# Patient Record
Sex: Male | Born: 1960 | Race: White | Hispanic: No | Smoking: Current some day smoker
Health system: Southern US, Community
[De-identification: ages and names within clinical notes are randomized; demographics above are authoritative.]

## PROBLEM LIST (undated history)

## (undated) DIAGNOSIS — I340 Nonrheumatic mitral (valve) insufficiency: Secondary | ICD-10-CM

## (undated) DIAGNOSIS — I219 Acute myocardial infarction, unspecified: Secondary | ICD-10-CM

## (undated) DIAGNOSIS — G4730 Sleep apnea, unspecified: Secondary | ICD-10-CM

## (undated) DIAGNOSIS — M4800 Spinal stenosis, site unspecified: Secondary | ICD-10-CM

## (undated) DIAGNOSIS — I10 Essential (primary) hypertension: Secondary | ICD-10-CM

## (undated) DIAGNOSIS — I499 Cardiac arrhythmia, unspecified: Secondary | ICD-10-CM

## (undated) DIAGNOSIS — Z8679 Personal history of other diseases of the circulatory system: Secondary | ICD-10-CM

## (undated) DIAGNOSIS — E7800 Pure hypercholesterolemia, unspecified: Secondary | ICD-10-CM

## (undated) DIAGNOSIS — T8859XA Other complications of anesthesia, initial encounter: Secondary | ICD-10-CM

## (undated) DIAGNOSIS — Z9889 Other specified postprocedural states: Secondary | ICD-10-CM

## (undated) DIAGNOSIS — Z951 Presence of aortocoronary bypass graft: Secondary | ICD-10-CM

## (undated) DIAGNOSIS — R011 Cardiac murmur, unspecified: Secondary | ICD-10-CM

## (undated) DIAGNOSIS — R519 Headache, unspecified: Secondary | ICD-10-CM

## (undated) DIAGNOSIS — R51 Headache: Secondary | ICD-10-CM

## (undated) DIAGNOSIS — T4145XA Adverse effect of unspecified anesthetic, initial encounter: Secondary | ICD-10-CM

## (undated) DIAGNOSIS — I4819 Other persistent atrial fibrillation: Secondary | ICD-10-CM

## (undated) DIAGNOSIS — J189 Pneumonia, unspecified organism: Secondary | ICD-10-CM

## (undated) DIAGNOSIS — I2510 Atherosclerotic heart disease of native coronary artery without angina pectoris: Secondary | ICD-10-CM

## (undated) HISTORY — PX: HAND NERVE GRAFT: SHX1727

## (undated) HISTORY — PX: FEMORAL ARTERY STENT: SHX1583

## (undated) HISTORY — PX: MOUTH SURGERY: SHX715

## (undated) HISTORY — DX: Other persistent atrial fibrillation: I48.19

## (undated) HISTORY — PX: ROTATOR CUFF REPAIR: SHX139

## (undated) HISTORY — DX: Spinal stenosis, site unspecified: M48.00

## (undated) HISTORY — DX: Nonrheumatic mitral (valve) insufficiency: I34.0

## (undated) HISTORY — PX: CARDIAC CATHETERIZATION: SHX172

---

## 2002-12-01 ENCOUNTER — Encounter: Admission: RE | Admit: 2002-12-01 | Discharge: 2002-12-01 | Payer: Self-pay | Admitting: Internal Medicine

## 2002-12-01 ENCOUNTER — Encounter: Payer: Self-pay | Admitting: Internal Medicine

## 2007-12-23 ENCOUNTER — Inpatient Hospital Stay (HOSPITAL_COMMUNITY): Admission: EM | Admit: 2007-12-23 | Discharge: 2007-12-27 | Payer: Self-pay | Admitting: Emergency Medicine

## 2007-12-23 ENCOUNTER — Ambulatory Visit: Payer: Self-pay | Admitting: Cardiovascular Disease

## 2008-01-11 ENCOUNTER — Ambulatory Visit: Payer: Self-pay | Admitting: Cardiovascular Disease

## 2008-11-30 DIAGNOSIS — I2510 Atherosclerotic heart disease of native coronary artery without angina pectoris: Secondary | ICD-10-CM | POA: Insufficient documentation

## 2008-11-30 DIAGNOSIS — E669 Obesity, unspecified: Secondary | ICD-10-CM | POA: Insufficient documentation

## 2008-11-30 DIAGNOSIS — F17200 Nicotine dependence, unspecified, uncomplicated: Secondary | ICD-10-CM | POA: Insufficient documentation

## 2008-11-30 DIAGNOSIS — I10 Essential (primary) hypertension: Secondary | ICD-10-CM | POA: Insufficient documentation

## 2008-11-30 DIAGNOSIS — E785 Hyperlipidemia, unspecified: Secondary | ICD-10-CM | POA: Insufficient documentation

## 2008-11-30 DIAGNOSIS — R7309 Other abnormal glucose: Secondary | ICD-10-CM | POA: Insufficient documentation

## 2008-12-10 ENCOUNTER — Ambulatory Visit: Payer: Self-pay | Admitting: Cardiovascular Disease

## 2009-07-10 ENCOUNTER — Encounter: Admission: RE | Admit: 2009-07-10 | Discharge: 2009-07-10 | Payer: Self-pay | Admitting: Internal Medicine

## 2009-08-20 ENCOUNTER — Encounter: Payer: Self-pay | Admitting: Cardiovascular Disease

## 2009-08-20 ENCOUNTER — Ambulatory Visit (HOSPITAL_BASED_OUTPATIENT_CLINIC_OR_DEPARTMENT_OTHER): Admission: RE | Admit: 2009-08-20 | Discharge: 2009-08-20 | Payer: Self-pay | Admitting: Cardiovascular Disease

## 2009-08-21 ENCOUNTER — Telehealth (INDEPENDENT_AMBULATORY_CARE_PROVIDER_SITE_OTHER): Payer: Self-pay | Admitting: Radiology

## 2009-08-21 ENCOUNTER — Telehealth (INDEPENDENT_AMBULATORY_CARE_PROVIDER_SITE_OTHER): Payer: Self-pay | Admitting: *Deleted

## 2009-08-22 ENCOUNTER — Ambulatory Visit: Payer: Self-pay

## 2009-08-22 ENCOUNTER — Ambulatory Visit: Payer: Self-pay | Admitting: Cardiovascular Disease

## 2009-08-22 ENCOUNTER — Ambulatory Visit: Payer: Self-pay | Admitting: Cardiology

## 2009-08-22 ENCOUNTER — Encounter (HOSPITAL_COMMUNITY): Admission: RE | Admit: 2009-08-22 | Discharge: 2009-10-15 | Payer: Self-pay | Admitting: Cardiovascular Disease

## 2009-08-25 LAB — CONVERTED CEMR LAB
AST: 19 units/L (ref 0–37)
CO2: 29 meq/L (ref 19–32)
Calcium: 8.8 mg/dL (ref 8.4–10.5)
Glucose, Bld: 108 mg/dL — ABNORMAL HIGH (ref 70–99)
HDL: 59.9 mg/dL (ref >39.00)
Sodium: 140 meq/L (ref 135–145)
Total Bilirubin: 0.3 mg/dL (ref 0.3–1.2)
Total CHOL/HDL Ratio: 3

## 2009-08-26 ENCOUNTER — Encounter: Payer: Self-pay | Admitting: Cardiology

## 2009-08-26 ENCOUNTER — Encounter (HOSPITAL_COMMUNITY): Admission: RE | Admit: 2009-08-26 | Discharge: 2009-10-15 | Payer: Self-pay | Admitting: Cardiovascular Disease

## 2009-08-26 ENCOUNTER — Ambulatory Visit: Payer: Self-pay

## 2009-08-27 ENCOUNTER — Ambulatory Visit: Payer: Self-pay | Admitting: Pulmonary Disease

## 2009-08-27 ENCOUNTER — Encounter: Payer: Self-pay | Admitting: Cardiovascular Disease

## 2009-09-12 ENCOUNTER — Telehealth: Payer: Self-pay | Admitting: Cardiovascular Disease

## 2009-09-17 ENCOUNTER — Ambulatory Visit: Payer: Self-pay | Admitting: Cardiovascular Disease

## 2009-09-20 LAB — CONVERTED CEMR LAB
CO2: 28 meq/L (ref 19–32)
Chloride: 97 meq/L (ref 96–112)
Glucose, Bld: 90 mg/dL (ref 70–99)
Potassium: 4.2 meq/L (ref 3.5–5.1)
Sodium: 133 meq/L — ABNORMAL LOW (ref 135–145)

## 2009-12-04 ENCOUNTER — Encounter: Admission: RE | Admit: 2009-12-04 | Discharge: 2009-12-04 | Payer: Self-pay | Admitting: Orthopedic Surgery

## 2010-02-25 ENCOUNTER — Encounter: Payer: Self-pay | Admitting: Cardiovascular Disease

## 2010-03-07 ENCOUNTER — Encounter: Payer: Self-pay | Admitting: Cardiovascular Disease

## 2010-03-13 ENCOUNTER — Ambulatory Visit: Payer: Self-pay | Admitting: Cardiovascular Disease

## 2010-03-24 ENCOUNTER — Telehealth: Payer: Self-pay | Admitting: Cardiovascular Disease

## 2010-04-01 ENCOUNTER — Observation Stay (HOSPITAL_COMMUNITY): Admission: RE | Admit: 2010-04-01 | Discharge: 2010-04-02 | Payer: Self-pay | Admitting: Orthopedic Surgery

## 2010-04-18 ENCOUNTER — Telehealth (INDEPENDENT_AMBULATORY_CARE_PROVIDER_SITE_OTHER): Payer: Self-pay | Admitting: *Deleted

## 2010-07-15 NOTE — Progress Notes (Signed)
Summary: REFILL  Phone Note Refill Request Message from:  Patient on August 21, 2009 8:51 AM  Refills Requested: Medication #1:  PLAVIX 75 MG TABS Take one tablet by mouth daily  Medication #2:  METOPROLOL TARTRATE 50 MG TABS Take 1 1/2  tablet by mouth twice a day  Medication #3:  LOVASTATIN 40 MG TABS Take two  tablets by mouth daily at bedtime [facility]  [phone] [facility]  Initial call taken by: Judie Grieve,  August 21, 2009 8:53 AM  Follow-up for Phone Call        Rx faxed to pharmacy Follow-up by: Vikki Ports,  August 21, 2009 11:19 AM    Prescriptions: PLAVIX 75 MG TABS (CLOPIDOGREL BISULFATE) Take one tablet by mouth daily  #30 x 6   Entered by:   Vikki Ports   Authorized by:   Norva Karvonen, MD   Signed by:   Vikki Ports on 08/21/2009   Method used:   Faxed to ...       [facility]* (retail)       [address]       [place], [place]  [address]       Ph: [phone]       Fax: [phone]   RxID:   [id number] METOPROLOL TARTRATE 50 MG TABS (METOPROLOL TARTRATE) Take 1 1/2  tablet by mouth twice a day  #90 x 6   Entered by:   Vikki Ports   Authorized by:   Norva Karvonen, MD   Signed by:   Vikki Ports on 08/21/2009   Method used:   Faxed to ...       [facility]* (retail)       [address]       [place], [place]  [address]       Ph: [phone]       Fax: [phone]   RxID:   [id number] LOVASTATIN 40 MG TABS (LOVASTATIN) Take two  tablets by mouth daily at bedtime  #60 x 6   Entered by:   Vikki Ports   Authorized by:   Norva Karvonen, MD   Signed by:   Vikki Ports on 08/21/2009   Method used:   Faxed to ...       [facility]* (retail)       [address]       [place], [place]  [id number]       Ph: [phone]       Fax: [phone]   RxID:   [id number]

## 2010-07-15 NOTE — Progress Notes (Signed)
Summary: refill request  Phone Note Refill Request Message from:  Patient on April 18, 2010 8:48 AM  Refills Requested: Medication #1:  PLAVIX 75 MG TABS Take one tablet by mouth daily [facility]   Method Requested: Telephone to Pharmacy Initial call taken by: Glynda Jaeger,  April 18, 2010 8:49 AM  Follow-up for Phone Call        Rx faxed to pharmacy. Vikki Ports  April 18, 2010 11:34 AM     Prescriptions: PLAVIX 75 MG TABS (CLOPIDOGREL BISULFATE) Take one tablet by mouth daily  #30 x 6   Entered by:   Vikki Ports   Authorized by:   Norva Karvonen, MD   Signed by:   Vikki Ports on 04/18/2010   Method used:   Faxed to ...       [facility]* (retail)       [address]       [place], [place]  [id number]       Ph: [phone]       Fax: [phone]   RxID:   [id number]

## 2010-07-15 NOTE — Progress Notes (Signed)
Summary: Sleep Study Results  Phone Note Outgoing Call Call back at [facility] Phone [phone]   Call placed by: Julieta Gutting, RN, BSN,  September 12, 2009 3:07 PM Call placed to: Patient Summary of Call: Left message for pt to call back about sleep study results. Julieta Gutting, RN, BSN  September 12, 2009 3:07 PM  Follow-up for Phone Call        returning call, Migdalia Dk  September 13, 2009 8:08 AM   Left message for pt to call back. Julieta Gutting, RN, BSN  September 13, 2009 9:08 AM  I spoke with the pt about the results of his sleep study.  The pt does have severe obstructive sleep apnea.  I told the pt that he needs to see a Pulmonary MD for sleep consult.  The pt refuses an appt at this time and he does not want to use CPAP.  I will forward this information to Dr Excell Seltzer so that he is aware of the pt's refusal. Follow-up by: Julieta Gutting, RN, BSN,  September 13, 2009 10:08 AM

## 2010-07-15 NOTE — Assessment & Plan Note (Signed)
Summary: aden stress only wt 335/dr cooper/sl  Nuclear Med Background Indications for Stress Test: Evaluation for Ischemia, Stent Patency   History: Heart Catheterization, Myocardial Infarction, Stents  History Comments: '09 NSTEMI >Stent-RCA, LAD  Symptoms: DOE, Fatigue    Nuclear Pre-Procedure Cardiac Risk Factors: Family History - CAD, Hypertension, Lipids, Obesity, Smoker Caffeine/Decaff Intake: None NPO After: 8:30 AM Lungs: Clear.  O2 Sat 94% on RA. IV 0.9% NS with Angio Cath: 20g     IV Site: (R) AC IV Started by: Stanton Kidney EMT-P Chest Size (in) 54     Height (in): 72 Weight (lb): 316 BMI: 43.01  Nuclear Med Study 1 or 2 day study:  2 day     Stress Test Type:  Eugenie Birks Reading MD:  Willa Rough, MD     Referring MD:  Tonny Bollman, MD Resting Radionuclide:  Technetium 56m Tetrofosmin     Resting Radionuclide Dose:  33.0 mCi  Stress Radionuclide:  Technetium 32m Tetrofosmin     Stress Radionuclide Dose:  33.0 mCi   Stress Protocol   Lexiscan: 0.4 mg   Stress Test Technologist:  Rea College CMA-N     Nuclear Technologist:  Domenic Polite CNMT  Rest Procedure  Myocardial perfusion imaging was performed at rest 45 minutes following the intravenous administration of Myoview Technetium 78m Tetrofosmin.  Stress Procedure  The patient received IV Lexiscan 0.4 mg over 15-seconds.  Myoview injected at 30-seconds.  There were no significant changes with lexiscan.  He did c/o chest pain with lexiscan.  Quantitative spect images were obtained after a 45 minute delay.  QPS Raw Data Images:  Patient motion noted; appropriate software correction applied. Stress Images:  Some decrease in activity in the inferior wall. Rest Images:  Same as stress. Subtraction (SDS):  No evidence of ischemia. Transient Ischemic Dilatation:  1.02  (Normal <1.22)  Lung/Heart Ratio:  .35  (Normal <0.45)  Quantitative Gated Spect Images QGS EDV:  230 ml QGS ESV:  115 ml QGS EF:  50  % QGS cine images:  Possible mild relative hypokinesis of the inferior wall   Overall Impression  Exercise Capacity: Lexiscan BP Response: Normal blood pressure response. Clinical Symptoms: chest cramping ECG Impression: No significant ST segment change suggestive of ischemia. Overall Impression Comments: Considering the patient's weight and the fact that  there was some motion, it is not surprising that the images are technically difficult. There is no marked abnormality. I can not rule out the possibility of some inferior scar/ischemia, but this is a difficult call.

## 2010-07-15 NOTE — Progress Notes (Signed)
Summary: Nuc Pre-Procedure  Phone Note Call from Patient   Caller: Patient Summary of Call: Reviewed information on Myoview Information Sheet (see scanned document for further details).  Spoke with patient. Initial call taken by: Leonia Corona, RT-N,  August 21, 2009 9:13 AM    Nuclear Med Background Indications for Stress Test: Evaluation for Ischemia, Stent Patency   History: Heart Catheterization, Myocardial Infarction, Stents  History Comments: '09 NSTEMI > RCA, LAD Stents  Symptoms: DOE, Fatigue    Nuclear Pre-Procedure Cardiac Risk Factors: Family History - CAD, Hypertension, Lipids, Obesity, Smoker Height (in): 72

## 2010-07-15 NOTE — Consult Note (Signed)
Summary: [facility] Pre Op Consult   [facility] Pre Op Consult   Imported By: Roderic Ovens 04/18/2010 13:01:02  _____________________________________________________________________  External Attachment:    Type:   Image     Comment:   External Document

## 2010-07-15 NOTE — Progress Notes (Signed)
Summary: question re stopping med before surgery  Phone Note Call from Patient   Caller: Patient [phone] Reason for Call: Talk to Nurse Summary of Call: pt having rotater cuff surgery and wants to know when he should stop blood thinners-ok to leave msg Initial call taken by: Glynda Jaeger,  March 24, 2010 3:03 PM  Follow-up for Phone Call        [facility] Katina Dung, RN, BSN  March 24, 2010 3:08 PM --talked with-pt scheduled for rotator cuff surgery 04/01/10 by Dr Liz Beach is asking if he should hold Plavix prior to surgery--I told him the surgeon should make recommendation about  length of time to hold Plavix prior to surgery,but usually 7 days should be OK-he had a bare metal stent in July 2009-- --pt states he has been trying to reach Dr Diamantina Providence office -he called today and has not gotten a call back --he states Dr Excell Seltzer cleared him for surgery last week but pt did not ask about holding Plavix--I reviewed with Dr Chelsea Aus should be OK for pt to hold Plavix for 7 days prior to surgery--I will followup with pt Katina Dung, RN, BSN  March 24, 2010 5:20 PM   Additional Follow-up for Phone Call Additional follow up Details #1::        [facility] office [phone] checking on status of clearance Lorne Skeens  March 25, 2010 10:34 AM     Additional Follow-up for Phone Call Additional follow up Details #2::    see office note Follow-up by: Norva Karvonen, MD,  March 26, 2010 11:32 AM

## 2010-07-15 NOTE — Letter (Signed)
Summary: [facility] Visit Note   [facility] Visit Note   Imported By: Roderic Ovens 03/25/2010 09:50:08  _____________________________________________________________________  External Attachment:    Type:   Image     Comment:   External Document

## 2010-07-15 NOTE — Assessment & Plan Note (Signed)
Summary: [facility]   Visit Type:  4 months follow up Primary Provider:  none  CC:  Test results.  History of Present Illness: This is a 50 year old gentleman with coronary artery disease.  He presented with a non-ST-elevation MI in 2009 and was found to have severe two-vessel CAD.  His culprit vessel was the right coronary artery with 99% stenosis and TIMI II flow in that vessel.  This was a large vessel that was treated with a bare-metal stent.  He underwent a staged intervention of a severe mid-LAD stenosis and was treated with a drug-eluting stent in that vessel.   States he has been fatigued. Denies chest pain. Complains of exertional dyspnea. Has not followed a diet or exercdise program. Was recently seen by Herma Carson and she ordered a Myoview for evaluation of fatigue and dyspnea. This showed no significant ischemic defect.  Current Medications (verified): 1)  Aspirin 81 Mg Tbec (Aspirin) .... Take One Tablet By Mouth Daily 2)  Lovastatin 40 Mg Tabs (Lovastatin) .... Take Two  Tablets By Mouth Daily At Bedtime 3)  Metoprolol Tartrate 50 Mg Tabs (Metoprolol Tartrate) .... Take 1 1/2  Tablet By Mouth Twice A Day 4)  Plavix 75 Mg Tabs (Clopidogrel Bisulfate) .... Take One Tablet By Mouth Daily 5)  Nitroglycerin 0.4 Mg Subl (Nitroglycerin) .... One Tablet Under Tongue Every 5 Minutes As Needed For Chest Pain---May Repeat Times Three 6)  Azor 5-40 Mg Tabs (Amlodipine-Olmesartan) .... Take 1 Tablet By Mouth Once A Day 7)  Nuvigil 250 Mg Tabs (Armodafinil) .... As Needed 8)  Nucynta 75 Mg Tabs (Tapentadol Hcl) .... As Needed  Allergies (verified): No Known Drug Allergies  Past History:  Past medical history reviewed for relevance to current acute and chronic problems.  Past Medical History: Reviewed history from 11/30/2008 and no changes required. Current Problems:  CAD (ICD-414.00)- acute coronary syndrome treated with bare-metal stent in the right coronary artery and  drug-eluting stent to the LAD. HYPERTENSION (ICD-401.9) DYSLIPIDEMIA (ICD-272.4) TOBACCO ABUSE (ICD-305.1) OBESITY (ICD-278.00) HYPERGLYCEMIA (ICD-790.29)       Review of Systems       Negative except as per HPI   Vital Signs:  Patient profile:   50 year old male Height:      72 inches Weight:      319 pounds BMI:     43.42 Pulse rate:   78 / minute Pulse rhythm:   regular Resp:     18 per minute BP sitting:   160 / 90  (left arm) Cuff size:   large  Vitals Entered By: Vikki Ports (August 27, 2009 12:12 PM)  Physical Exam  General:  Pt is alert and oriented, obese male, in no acute distress. HEENT: normal Neck: normal carotid upstrokes without bruits, JVP normal Lungs: CTA CV: RRR without murmur or gallop Abd: soft, NT, positive BS, no bruit, no organomegaly Ext: no clubbing, cyanosis, or edema. peripheral pulses 2+ and equal Skin: warm and dry without rash    Impression & Recommendations:  Problem # 1:  CAD (ICD-414.00) Myoview images and report reviewed. Overall appears low-risk. Recommend continued medical therapy. Long discussion regarding the importance of diet and exercise. Pt needs to achieve substantial weight loss. continue long-term ASA and plavix.  The following medications were removed from the medication list:    Lisinopril 40 Mg Tabs (Lisinopril) .Marland Kitchen... Take 1 tablet by mouth once a day His updated medication list for this problem includes:    Aspirin 81 Mg Tbec (  Aspirin) .Marland Kitchen... Take one tablet by mouth daily    Metoprolol Tartrate 50 Mg Tabs (Metoprolol tartrate) .Marland Kitchen... Take 1 1/2  tablet by mouth twice a day    Plavix 75 Mg Tabs (Clopidogrel bisulfate) .Marland Kitchen... Take one tablet by mouth daily    Nitroglycerin 0.4 Mg Subl (Nitroglycerin) ..... One tablet under tongue every 5 minutes as needed for chest pain---may repeat times three    Azor 5-40 Mg Tabs (Amlodipine-olmesartan) .Marland Kitchen... Take 1 tablet by mouth once a day  Problem # 2:  HYPERTENSION  (ICD-401.9) BP poorly controlled - add HCTZ 25 mg and check BMET 2 weeks.  The following medications were removed from the medication list:    Lisinopril 40 Mg Tabs (Lisinopril) .Marland Kitchen... Take 1 tablet by mouth once a day His updated medication list for this problem includes:    Aspirin 81 Mg Tbec (Aspirin) .Marland Kitchen... Take one tablet by mouth daily    Metoprolol Tartrate 50 Mg Tabs (Metoprolol tartrate) .Marland Kitchen... Take 1 1/2  tablet by mouth twice a day    Azor 5-40 Mg Tabs (Amlodipine-olmesartan) .Marland Kitchen... Take 1 tablet by mouth once a day    Hydrochlorothiazide 25 Mg Tabs (Hydrochlorothiazide) .Marland Kitchen... Take one tablet by mouth daily.  BP today: 160/90 Prior BP: 170/114 (12/10/2008)  Labs Reviewed: K+: 4.0 (08/22/2009) Creat: : 0.6 (08/22/2009)   Chol: 172 (08/22/2009)   HDL: 59.90 (08/22/2009)   LDL: 98 (08/22/2009)   TG: 73.0 (08/22/2009)  Problem # 3:  DYSLIPIDEMIA (ICD-272.4) lipids at goal.  His updated medication list for this problem includes:    Lovastatin 40 Mg Tabs (Lovastatin) .Marland Kitchen... Take two  tablets by mouth daily at bedtime  CHOL: 172 (08/22/2009)   LDL: 98 (08/22/2009)   HDL: 59.90 (08/22/2009)   TG: 73.0 (08/22/2009)  Patient Instructions: 1)  Your physician recommends that you return for lab work in: 2 WEEKS (BMP 414.01, 401.9) 2)  Your physician has recommended you make the following change in your medication: START HCTZ 25mg  one tablet by mouth daily 3)  Your physician wants you to follow-up in:   6 MONTHS. You will receive a reminder letter in the mail two months in advance. If you don't receive a letter, please call our office to schedule the follow-up appointment. Prescriptions: HYDROCHLOROTHIAZIDE 25 MG TABS (HYDROCHLOROTHIAZIDE) Take one tablet by mouth daily.  #30 x 11   Entered by:   Julieta Gutting, RN, BSN   Authorized by:   Norva Karvonen, MD   Signed by:   Julieta Gutting, RN, BSN on 08/27/2009   Method used:   Electronically to        [facility]* (retail)        [address]       [place], [place]  [id number]       Ph: [phone]       Fax: [phone]   RxID:   [id number]

## 2010-07-15 NOTE — Assessment & Plan Note (Signed)
Summary: surgical clearance    Primary Provider:  none  CC:  Surgical clearance rotator cuff .  History of Present Illness: This is a 50 year old gentleman with coronary artery disease.  He presented with a non-ST-elevation MI in 2009 and was found to have severe two-vessel CAD.  His culprit vessel was the right coronary artery with 99% stenosis and TIMI II flow in that vessel.  This was a large vessel that was treated with a bare-metal stent.  He underwent a staged intervention of a severe mid-LAD stenosis and was treated with a drug-eluting stent in that vessel.   He has been back on a very hectic schedule and has gained weight and quit exercising. A sleep study was done to evaluate daytime somnolence and this demonstrated severe obstructive sleep apnea. I referred him for a sleep evaluation but he declined. He continues to have excessive somnolence.  He denies exertional chest pain or pressure. He does complain of leg swelling. No orthopnea or PND. Exertional dyspnea is at his baseline. No other complaints.   Current Medications (verified): 1)  Aspirin 81 Mg Tbec (Aspirin) .... Take One Tablet By Mouth Daily 2)  Lovastatin 40 Mg Tabs (Lovastatin) .... Take Two  Tablets By Mouth Daily At Bedtime 3)  Metoprolol Tartrate 50 Mg Tabs (Metoprolol Tartrate) .... Take 1 1/2  Tablet By Mouth Twice A Day 4)  Plavix 75 Mg Tabs (Clopidogrel Bisulfate) .... Take One Tablet By Mouth Daily 5)  Nitroglycerin 0.4 Mg Subl (Nitroglycerin) .... One Tablet Under Tongue Every 5 Minutes As Needed For Chest Pain---May Repeat Times Three 6)  Tribenzor 40-10-25 Mg Tabs (Olmesartan-Amlodipine-Hctz) .... Take 1 Tablet By Mouth Once A Day 7)  Nuvigil 250 Mg Tabs (Armodafinil) .... One Tablet Daily 8)  Hydrochlorothiazide 25 Mg Tabs (Hydrochlorothiazide) .... Take One Tablet By Mouth Daily. 9)  Multivitamin A-Z .... Take One Daily 10)  Vitamin A and D 5000 Iu/400iu .... 2 Daily 11)  Vitamin B12 .... One  Daily 12)  Vitamin C 500 Mg  Tabs (Ascorbic Acid) .... Take 2 Daily 13)  Garlic 1000mg  .... Take 2 Daily 14)  Omega 3, 6, 9 15)  Green Tea Max 400mg  .... Take 2 Daily  Allergies (verified): No Known Drug Allergies  Past History:  Past medical history reviewed for relevance to current acute and chronic problems.  Past Medical History: Reviewed history from 11/30/2008 and no changes required. Current Problems:  CAD (ICD-414.00)- acute coronary syndrome treated with bare-metal stent in the right coronary artery and drug-eluting stent to the LAD. HYPERTENSION (ICD-401.9) DYSLIPIDEMIA (ICD-272.4) TOBACCO ABUSE (ICD-305.1) OBESITY (ICD-278.00) HYPERGLYCEMIA (ICD-790.29)       Review of Systems       Positive for shoulder pain, otherwise negative except as per HPI  Vital Signs:  Patient profile:   50 year old male Height:      72 inches Weight:      317.25 pounds BMI:     43.18 Pulse rate:   55 / minute Pulse rhythm:   regular Resp:     18 per minute BP sitting:   148 / 90  (left arm) Cuff size:   large  Vitals Entered By: Vikki Ports (March 13, 2010 11:15 AM)  Physical Exam  General:  Pt is alert and oriented, obese male, in no acute distress. HEENT: normal Neck: normal carotid upstrokes without bruits, JVP normal Lungs: CTA CV: RRR without murmur or gallop Abd: soft, NT, positive BS, no bruit, no organomegaly Ext: trace  bilateral pretibial edema. peripheral pulses 2+ and equal Skin: warm and dry without rash    EKG  Procedure date:  03/13/2010  Findings:      Sinus bradycardia HR 55 bpm, sinus arrhythmia, otherwise within limits. No significant ST-T abnormality.  Impression & Recommendations:  Problem # 1:  CAD (ICD-414.00) Myoview results reviewed from March 2011, and this showed no large area of ischemia. The patient should continue on his current medical program. He is greater than one year out from PCI when he had a bare metal stent placed in his  culprit vessel and a drug-eluting stent in the nonculprit. It is acceptable to hold Plavix prior to surgery and to resume when it is ok from a surgical standpoint. It would be preferable to continue ASA 81 mg if the bleeding risk is not prohibitive. The pt is having no angina and should continue on his other medications without interruption throughout the perioperative period.  His updated medication list for this problem includes:    Aspirin 81 Mg Tbec (Aspirin) .Marland Kitchen... Take one tablet by mouth daily    Metoprolol Tartrate 50 Mg Tabs (Metoprolol tartrate) .Marland Kitchen... Take 1 1/2  tablet by mouth twice a day    Plavix 75 Mg Tabs (Clopidogrel bisulfate) .Marland Kitchen... Take one tablet by mouth daily    Nitroglycerin 0.4 Mg Subl (Nitroglycerin) ..... One tablet under tongue every 5 minutes as needed for chest pain---may repeat times three    Tribenzor 40-10-25 Mg Tabs (Olmesartan-amlodipine-hctz) .Marland Kitchen... Take 1 tablet by mouth once a day  Orders: EKG w/ Interpretation ([address]) Sleep Disorder Referral (Sleep Disorder)  Problem # 2:  HYPERTENSION (ICD-401.9) We had a long discussion today regarding the importance of lifestyle change, weight loss, exercise, diet, and treatment of sleep apnea. I am not inclined to treat him with more medication at this point. He has agreed to followup with Dr Shelle Iron for evaluation of his sleep apnea.  His updated medication list for this problem includes:    Aspirin 81 Mg Tbec (Aspirin) .Marland Kitchen... Take one tablet by mouth daily    Metoprolol Tartrate 50 Mg Tabs (Metoprolol tartrate) .Marland Kitchen... Take 1 1/2  tablet by mouth twice a day    Tribenzor 40-10-25 Mg Tabs (Olmesartan-amlodipine-hctz) .Marland Kitchen... Take 1 tablet by mouth once a day    Hydrochlorothiazide 25 Mg Tabs (Hydrochlorothiazide) .Marland Kitchen... Take one tablet by mouth daily.  Orders: EKG w/ Interpretation (93[address]) Sleep Disorder Referral (Sleep Disorder)  BP today: 148/90 Prior BP: 160/90 (08/27/2009)  Labs Reviewed: K+: 4.2  (09/17/2009) Creat: : 0.9 (09/17/2009)   Chol: 172 (08/22/2009)   HDL: 59.90 (08/22/2009)   LDL: 98 (08/22/2009)   TG: 73.0 (08/22/2009)  Problem # 3:  DYSLIPIDEMIA (ICD-272.4) Recommend continue Lovastatin at this point - med cost is major issue.  His updated medication list for this problem includes:    Lovastatin 40 Mg Tabs (Lovastatin) .Marland Kitchen... Take two  tablets by mouth daily at bedtime  Orders: EKG w/ Interpretation (93[address]) Sleep Disorder Referral (Sleep Disorder)  CHOL: 172 (08/22/2009)   LDL: 98 (08/22/2009)   HDL: 59.90 (08/22/2009)   TG: 73.0 (08/22/2009)  Patient Instructions: 1)  Your physician recommends that you continue on your current medications as directed. Please refer to the Current Medication list given to you today. 2)  Your physician wants you to follow-up in: 6 MONTHS.   You will receive a reminder letter in the mail two months in advance. If you don't receive a letter, please call our office to schedule the  follow-up appointment. 3)  You have been referred to Dr Shelle Iron for follow-up of abnormal sleep study.   Appended Document: surgical clearance  Pt cancelled 03/28/10 OV with Dr Shelle Iron.

## 2010-08-28 LAB — BASIC METABOLIC PANEL
BUN: 11 mg/dL (ref 6–23)
CO2: 28 mEq/L (ref 19–32)
Chloride: 103 mEq/L (ref 96–112)
Glucose, Bld: 102 mg/dL — ABNORMAL HIGH (ref 70–99)
Potassium: 4.8 mEq/L (ref 3.5–5.1)

## 2010-08-28 LAB — CBC
HCT: 44 % (ref 39.0–52.0)
MCHC: 33.9 g/dL (ref 30.0–36.0)
MCV: 88.4 fL (ref 78.0–100.0)
RDW: 12.2 % (ref 11.5–15.5)

## 2010-08-28 LAB — DIFFERENTIAL
Basophils Absolute: 0 10*3/uL (ref 0.0–0.1)
Basophils Relative: 1 % (ref 0–1)
Eosinophils Relative: 3 % (ref 0–5)
Monocytes Absolute: 0.7 10*3/uL (ref 0.1–1.0)
Monocytes Relative: 9 % (ref 3–12)

## 2010-08-28 LAB — SURGICAL PCR SCREEN: MRSA, PCR: NEGATIVE

## 2010-10-28 NOTE — Assessment & Plan Note (Signed)
[facility]                            CARDIOLOGY OFFICE NOTE   Earl Martin, Earl Martin Earl Martin                     MRN:          [id number]  DATE:01/08/2008                            DOB:          06-Jul-1960    Earl Martin was seen as an outpatient in the [facility] Cardiology  Office on January 11, 2008.  He is a 50 year old gentleman with coronary  artery disease.  He presented with a non-ST-elevation MI earlier this  month and was found to have severe two-vessel CAD.  His culprit vessel  was the right coronary artery with 99% stenosis and TIMI II flow in that  vessel.  This was a large vessel that was treated with a bare-metal  stent.  He underwent a staged intervention of a severe mid-LAD stenosis  and was treated with a drug-eluting stent in that vessel.  Since  discharge from the hospital, he has done well from a symptomatic  standpoint.  Prior to his hospitalization, he did not follow with  regular medical attention.  He has worked hard to try to improve some of  his previous habits, which included tobacco abuse, heavy use of BC  powder, moderately heavy alcohol use, and poor dietary habits.  He has  reduced his alcohol intake greatly and is drinking a glass of wine at  night presently.  He is working on improving his diet and he is  completely cutout BC powder for his aches and pains.  He specifically  denies any chest pain, dyspnea, orthopnea, PND, or edema.  He went to  the beach last week and participated in moderate level exercise with  swimming and walking.   MEDICATIONS:  1. Aspirin 325 mg daily.  2. Plavix 75 mg daily.  3. Lovastatin 80 mg daily.  4. Metoprolol 25 mg twice daily.  5. Protonix 40 mg daily.  6. Chantix as directed.   ALLERGIES:  NKDA.   PHYSICAL EXAMINATION:  GENERAL:  The patient is alert and oriented.  He  is in no acute distress.  He is an obese white male.  VITAL SIGNS:  Weight is 314 pounds, blood pressure 170/120,  heart rate  104, and respiratory rate 12.  HEENT:  Normal.  NECK:  Normal carotid upstrokes without bruits.  JVP normal.  LUNGS:  Clear to auscultation bilaterally.  HEART:  Tachycardic and regular without murmurs or gallops.  ABDOMEN:  Soft, obese, nontender.  No organomegaly.  EXTREMITIES:  No clubbing, cyanosis, or edema.  Peripheral pulses are  intact and equal.   EKG shows sinus tachycardia with inferior T-wave abnormality suggestive  of ischemia, otherwise within normal limits.   ASSESSMENT:  1. Coronary artery disease with recent acute coronary syndrome treated      with bare-metal stent in the right coronary artery and drug-eluting      stent to the LAD.  Continue aspirin, Plavix, and medical therapy as      outlined.  For medicine changes, please see discussion below.  I      encouraged Earl Martin to continue to increase his activity.  I am  concerned about his increased heart rate and blood pressure.  I      have asked him to obtain a home blood pressure monitor that gives      heart rate as well as blood pressure readings.  For followup, I      would like to see him back in 3 weeks to make sure that he is      continuing to progress and that his blood pressure and heart rate      are coming under better control.  2. Hypertension.  Metoprolol was doubled today to 50 mg twice daily      and lisinopril 10 mg was added to his medical regimen.      Recommendation for home blood pressure monitor is noted above.  3. Dyslipidemia.  Currently on lovastatin.  Lipids in the hospital      showed an LDL of 130, HDL of 52, triglycerides of 93, and      cholesterol of 201.  I will follow up lipids and LFTs in September.  4. Lifestyle changes.  Long discussion about continued progress with      tobacco cessation and dietary changes as well as exercise with a      goal of significant weight loss.   For followup, I will see Earl Martin back in 3 weeks as noted.     Veverly Fells.  Excell Seltzer, MD  Electronically Signed    MDC/MedQ  DD: 01/11/2008  DT: 01/12/2008  Job #: [id number]

## 2010-10-28 NOTE — Discharge Summary (Signed)
NAME:  Earl Martin, Earl Martin NO.:  [id number]   MEDICAL RECORD NO.:  [id number]          PATIENT TYPE:  INP   LOCATION:  2918                         FACILITY:  [facility]   PHYSICIAN:  Veverly Fells. Excell Seltzer, MD  DATE OF BIRTH:  [birth year]   DATE OF ADMISSION:  12/23/2007  DATE OF DISCHARGE:  12/27/2007                               DISCHARGE SUMMARY   PROCEDURES:  1. Cardiac catheterization.  2. Coronary arteriogram.  3. Left ventriculogram.  4. Percutaneous transluminal coronary angioplasty with bare-metal      stent.  5. Staged intervention with percutaneous transluminal coronary      angioplasty and drug-eluting stent.   PRIMARY FINAL DISCHARGE DIAGNOSES:  Non-ST segment elevation myocardial  infarction.   SECONDARY DIAGNOSES:  1. Hypertension.  2. Tobacco use.  3. Obesity.  4. Hyperlipidemia with a total cholesterol of 201, triglycerides of      93, HDL 52, and LDL 130.  5. Hyperglycemia with a normal hemoglobin A1c of 5.7  6. Remote history of ankle surgery as well as left thumb surgery.   Time at discharge 37 minutes.   HOSPITAL COURSE:  Earl Martin is a 50 year old male with no previous  history of coronary artery disease.  He had anterior chest discomfort at  work and despite taking multiple BC powders he continued to have chest  discomfort intermittently.  When his symptoms worsened they were  associated with diaphoresis.  EMS was called and they transported him to  the hospital.  On admission, his point-of-care MB was 11.2 and his  troponin was 0.37.  He was admitted for further evaluation.   He continued to have non-ST segment elevation MI with a peak CK-MB of  532/78.9 and a peak troponin of 13.  A lipid profile as described above.  TSH was within normal limits.  Some of his blood sugars were elevated  with a peak being 145; however, hemoglobin A1c was within normal limits.   He was taken to the cath lab initially on December 23, 2007.  He had a 99%  focal  RCA lesion that was treated with PTCA and a bare-metal stent.  Dr.  Excell Seltzer felt the 90% proximal LAD lesion should be done as a staged  procedure.   A smoking cessation consult was called and he was started on Chantix.  He was started on a beta-blocker, but no ACE inhibitor was used because  his EF was within normal limits, 55% at cath.  Cardiac rehab ambulated  him.  By December 26, 2007, he was felt to be stable for repeat  catheterization.  Dr. Juanda Chance performed the procedure and treated the LAD  stenosis with a drug-eluting stent which reduced it from 80% to 0%.  He  was enrolled in the ADAPT drug-eluting stent study and will follow up  research with this.   On December 27, 2007, Earl Martin was ambulating without chest pain or  dizziness.  His groin was stable.  His EKG was unchanged.  His  creatinine was stable post cath.  Dr. Excell Seltzer felt that Earl Martin could  be safely discharged home  and followup as an outpatient.   DISCHARGE INSTRUCTIONS:  His activity level is to be increased  gradually.  He is to call our office for problems with cath site.  He is  to follow up with Dr. Excell Seltzer on January 11, 2008, at 2:00 p.m..  He is to  stick to a low-sodium heart-healthy diet.  He is to stop using BC  powders.  He is encouraged to obtain a family physician.   DISCHARGE MEDICATIONS:  1. Aspirin 325 mg daily.  2. Plavix 75 mg a day.  3. Nitroglycerin sublingual p.r.n.  4. Lovastatin 80 mg a day.  5. Metoprolol 25 mg b.i.d.  6. Protonix 40 mg a day.  7. Chantix 1 mg 1/2 tablet b.i.d. for 3 days, then 1 tab b.i.d.  8. Per patient request, Ambien 5 mg q.h.s. p.r.n.  9. Per patient request, Ultram 50 mg q.i.d. p.r.n.  10.Tylenol p.r.n..   Earl Martin was advised that we could do short-term prescriptions for  Ambien and Ultram to allow him time to find a family physician, but it  was made clear to him that we would not refill these.      Theodore Demark, PA-C      Veverly Fells. Excell Seltzer, MD   Electronically Signed    RB/MEDQ  D:  12/27/2007  T:  12/28/2007  Job:  [id number]

## 2010-10-28 NOTE — H&P (Signed)
NAME:  Earl Martin, Earl Martin NO.:  [id number]   MEDICAL RECORD NO.:  [id number]          PATIENT TYPE:  EMS   LOCATION:  MAJO                         FACILITY:  [facility]   PHYSICIAN:  Veverly Fells. Excell Seltzer, MD  DATE OF BIRTH:  1960/09/17   DATE OF ADMISSION:  12/23/2007  DATE OF DISCHARGE:                              HISTORY & PHYSICAL   SUMMERY OF HISTORY:  Mr. Pangilinan is a 50 year old while male who is  transported via EMS to [facility] secondary to his chest  discomfort.  Mr. Luu states that on Wednesday evening while at work,  he noticed an anterior chest discomfort, which he described as a balloon  in his chest expanding.  This did not radiate, but he did note shortness  of breath.  He denied nausea and vomiting.  He was not clear about  diaphoresis.  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  He gave it a 4 on a scale of 0-10.  He stated that  he just worked through the discomfort and he had taken 4 BC powders.  He  thinks all on all, the discomfort lasted an hour and a half.  Yesterday,  around 2 p.m. while shopping, he developed shortness of breath  associated with the same chest discomfort.  This lasted for about an  hour.  Needed to take 4 BC powders.  Gave it 4-5 on scale of 0-10.  However, this morning while drinking coffee at 7 a.m., he had recurring  symptoms that he described as much worse; the intensity was 8 on a scale  0-10.  This was now associated with diaphoresis.  He again took 4 BC  powders with slight improvement to a 6; however, he called EMS.  EMS  report is not available.  According to the patient, he did receive baby  aspirin and nitroglycerin x2, which reduced the discomfort to 5.  We  were called for evaluation at this time.  He is still having chest  discomfort.  He feels that it is somewhat pleuritic in nature.  He  denies any recent accidents or injuries or prior stomach problems  associated with  excessive BC powders.   ALLERGIES:  No known drug allergies.   MEDICATIONS:  He is not on any prescription medications.  Over-the-  counter medications include aspirin and BC powders, at least every other  day, possibly everyday.  He has chronic back, left hip, and left  shoulder problems due to sports related injuries.  He has had surgery on  his left ankle.  He also has been diagnosed with hypertension,  unspecified time ago.  However, he states he has not been on any  prescriptions for at least 6 years.  He says he does not want to follow  up with physicians.  He has also had left thumb surgery.  He is  specifically denies, diabetes, myocardial infarction, CVA, COPD,  bleeding, dysphagia, thyroid dysfunction, or renal disorder.  He does  not know his cholesterol status.   SOCIAL HISTORY:  He resides in  [place] with his wife.  He has 2 sons.  No grandchildren.  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  He smokes one and a half packs per day and doing so for 20 years.  He  states he drinks at least 6 pack of beer a day.  Denies drugs, herbal  medications, specific diet, or exercise.   FAMILY HISTORY:  His mother is alive at the age of 53; is in good  health.  His father, age 83, began treatment for heart problems in his  99s; now being treated for prostate cancer and a history of melanoma.  He has 2 sisters, alive and well.   REVIEW OF SYSTEMS:  In adjacent to the above, is notable for glasses,  dyspnea on exertion, snoring, arthralgias.  Wife pulls me out of the  room at the end of the interview and states that her husband also seems  to be having problems with depression, i.e., lack of desire to perform  ADLs and lack of interest in usual activities, but denies suicidal  thoughts.  She also states that he has complained of blood in his urine,  but she has not visualized this and she is not sure about specific  history.   PHYSICAL EXAMINATION:  GENERAL:   Well-nourished, well-developed, obese  white male in no apparent distress.  VITAL SIGNS:  Temperature 97.1, blood pressure 154/98, pulse 122, and  respirations 20.  96% sat on room air.  HEENT:  Grossly unremarkable.  NECK:  Supple without thyromegaly, adenopathy, JVD, or carotid bruits.  CHEST:  Symmetrical excursion.  LUNGS:  Clear to auscultation without rales, rhonchi, or wheezing.  HEART:  PMI is nondisplaced.  Regular rate and rhythm.  I do not  appreciate any murmurs, rubs, clicks, or gallops.  All pulses are  symmetrical and intact without abdominal or femoral bruits.  SKIN AND INTEGUMENT:  Appears to be intact without lesions or rashes.  ABDOMEN:  Obese.  Bowel sounds present without organomegaly, masses, or  tenderness.  EXTREMITIES:  Negative for cyanosis, clubbing, or edema.  MUSCULOSKELETAL:  Grossly unremarkable.  NEURO:  Unremarkable.   LABORATORY FINDINGS:  Chest x-ray shows mild cardiomegaly, bibasilar and  central congestion.  EKG shows sinus tachycardia with a ventricular rate  of 118.  He does have some ST-segment depression, V3 through V6, and 1  in aVL.  Subsequent EKG at 11:30 shows some slight improvement of ST-  segment depression in 1 and aVL.   H and H is 16.1, and 46.0, normal indices, platelets 303, WBC 12.0, i-  STAT shows a sodium 135, potassium 4.4, BUN 12, creatinine 0.9, glucose  elevated at 120, PTT 25, PT 12.4.  Point-of-care marker shows MB of 11.2  and troponin of 0.37; serum/troponin comes back at 1.17.   IMPRESSION:  1. Non-ST-elevated myocardial infarction.  2. Hypertension.  3. Tobacco use.  4. Hyperglycemia.  5. Obesity.  6. History as noted per past medical history.   DISPOSITION:  Dr. Excell Seltzer reviewed the patient's history, spoke with, and  examined the patient.  Given his presentation, blood work, and EKG  changes, and ongoing chest discomfort, Dr. Excell Seltzer has recommended an  urgent  cardiac catheterization.  The patient has watched  the video; risks,  indications, and benefits were discussed with the patient and with the  family, and they are agreeable to proceed as planned.  Further diagnoses  and plans will be based on findings.      Joellyn Rued, PA-C  Veverly Fells. Excell Seltzer, MD  Electronically Signed    EW/MEDQ  D:  12/23/2007  T:  12/24/2007  Job:  [id number]   cc:   Henri Medal, MD  Veverly Fells. Excell Seltzer, MD

## 2010-12-25 DIAGNOSIS — I219 Acute myocardial infarction, unspecified: Secondary | ICD-10-CM

## 2010-12-25 HISTORY — PX: CORONARY STENT PLACEMENT: SHX1402

## 2010-12-25 HISTORY — DX: Acute myocardial infarction, unspecified: I21.9

## 2010-12-26 HISTORY — PX: CORONARY STENT PLACEMENT: SHX1402

## 2011-03-12 LAB — POCT I-STAT, CHEM 8
BUN: 12
Calcium, Ion: 1.08 — ABNORMAL LOW
HCT: 49
Hemoglobin: 16.7
Sodium: 135
TCO2: 24

## 2011-03-12 LAB — BASIC METABOLIC PANEL
BUN: 7
BUN: 7
CO2: 27
CO2: 28
Calcium: 8.4
Chloride: 105
Creatinine, Ser: 0.68
GFR calc Af Amer: >60
GFR calc non Af Amer: >60
GFR calc non Af Amer: >60
GFR calc non Af Amer: >60
Glucose, Bld: 105 — ABNORMAL HIGH
Glucose, Bld: 105 — ABNORMAL HIGH
Glucose, Bld: 119 — ABNORMAL HIGH
Potassium: 3.9
Potassium: 4.2
Potassium: 4.8
Sodium: 135

## 2011-03-12 LAB — TSH: TSH: 1.879

## 2011-03-12 LAB — PROTIME-INR
INR: 1
Prothrombin Time: 13.1

## 2011-03-12 LAB — CBC
HCT: 40.2
HCT: 44.4
HCT: 46.1
Hemoglobin: 13.6
Hemoglobin: 16.1
MCHC: 33.8
MCHC: 33.9
MCHC: 34.1
MCHC: 35
MCV: 89.1
MCV: 90.1
Platelets: 240
Platelets: 250
RBC: 5.18
RDW: 12.4
RDW: 12.6
RDW: 12.6
RDW: 12.8

## 2011-03-12 LAB — COMPREHENSIVE METABOLIC PANEL
ALT: 24
AST: 100 — ABNORMAL HIGH
Albumin: 3.5
Alkaline Phosphatase: 55
BUN: 12
BUN: 6
CO2: 22
Calcium: 9.1
Chloride: 104
Creatinine, Ser: 0.82
GFR calc Af Amer: >60
GFR calc non Af Amer: >60
Glucose, Bld: 119 — ABNORMAL HIGH
Potassium: 3.6
Total Bilirubin: 1
Total Protein: 6.2
Total Protein: 6.8

## 2011-03-12 LAB — POCT CARDIAC MARKERS
CKMB, poc: 11.2
Operator id: 294501
Troponin i, poc: 0.37 — ABNORMAL HIGH

## 2011-03-12 LAB — APTT: aPTT: 25

## 2011-03-12 LAB — DIFFERENTIAL
Basophils Absolute: 0.2 — ABNORMAL HIGH
Eosinophils Relative: 1
Lymphocytes Relative: 15
Monocytes Absolute: 0.9
Monocytes Relative: 8

## 2011-03-12 LAB — TROPONIN I: Troponin I: 1.17

## 2011-03-12 LAB — LIPID PANEL: Cholesterol: 201 — ABNORMAL HIGH

## 2011-03-12 LAB — CARDIAC PANEL(CRET KIN+CKTOT+MB+TROPI)
Total CK: 532 — ABNORMAL HIGH
Troponin I: 13.03

## 2011-03-12 LAB — CK TOTAL AND CKMB (NOT AT ARMC)
CK, MB: 33.8 — ABNORMAL HIGH
Total CK: 240 — ABNORMAL HIGH

## 2011-06-01 ENCOUNTER — Ambulatory Visit
Admission: RE | Admit: 2011-06-01 | Discharge: 2011-06-01 | Disposition: A | Discharge status: Home / Self Care | Payer: BC Managed Care – PPO | Source: Ambulatory Visit | Attending: Internal Medicine | Admitting: Internal Medicine

## 2011-06-01 ENCOUNTER — Other Ambulatory Visit: Payer: Self-pay | Admitting: Internal Medicine

## 2011-06-01 DIAGNOSIS — R05 Cough: Secondary | ICD-10-CM

## 2011-06-01 DIAGNOSIS — R059 Cough, unspecified: Secondary | ICD-10-CM

## 2012-09-20 ENCOUNTER — Encounter (HOSPITAL_COMMUNITY)
Admission: RE | Disposition: A | Discharge status: Home / Self Care | Payer: Self-pay | Source: Ambulatory Visit | Attending: Cardiology

## 2012-09-20 ENCOUNTER — Encounter (HOSPITAL_COMMUNITY): Payer: Self-pay | Admitting: *Deleted

## 2012-09-20 ENCOUNTER — Encounter (HOSPITAL_COMMUNITY): Payer: Self-pay

## 2012-09-20 ENCOUNTER — Ambulatory Visit (HOSPITAL_COMMUNITY)
Admission: RE | Admit: 2012-09-20 | Discharge: 2012-09-20 | Disposition: A | Discharge status: Home / Self Care | Payer: BC Managed Care – PPO | Source: Ambulatory Visit | Attending: Cardiology | Admitting: Cardiology

## 2012-09-20 ENCOUNTER — Ambulatory Visit (HOSPITAL_COMMUNITY): Payer: BC Managed Care – PPO | Admitting: *Deleted

## 2012-09-20 DIAGNOSIS — F17200 Nicotine dependence, unspecified, uncomplicated: Secondary | ICD-10-CM | POA: Insufficient documentation

## 2012-09-20 DIAGNOSIS — I10 Essential (primary) hypertension: Secondary | ICD-10-CM | POA: Insufficient documentation

## 2012-09-20 DIAGNOSIS — Z7901 Long term (current) use of anticoagulants: Secondary | ICD-10-CM | POA: Insufficient documentation

## 2012-09-20 DIAGNOSIS — I252 Old myocardial infarction: Secondary | ICD-10-CM | POA: Insufficient documentation

## 2012-09-20 DIAGNOSIS — I2510 Atherosclerotic heart disease of native coronary artery without angina pectoris: Secondary | ICD-10-CM | POA: Insufficient documentation

## 2012-09-20 DIAGNOSIS — E785 Hyperlipidemia, unspecified: Secondary | ICD-10-CM | POA: Insufficient documentation

## 2012-09-20 DIAGNOSIS — Z79899 Other long term (current) drug therapy: Secondary | ICD-10-CM | POA: Insufficient documentation

## 2012-09-20 DIAGNOSIS — Z7982 Long term (current) use of aspirin: Secondary | ICD-10-CM | POA: Insufficient documentation

## 2012-09-20 DIAGNOSIS — G4733 Obstructive sleep apnea (adult) (pediatric): Secondary | ICD-10-CM | POA: Insufficient documentation

## 2012-09-20 DIAGNOSIS — Z9861 Coronary angioplasty status: Secondary | ICD-10-CM | POA: Insufficient documentation

## 2012-09-20 DIAGNOSIS — I4891 Unspecified atrial fibrillation: Secondary | ICD-10-CM | POA: Insufficient documentation

## 2012-09-20 HISTORY — DX: Essential (primary) hypertension: I10

## 2012-09-20 HISTORY — PX: CARDIOVERSION: SHX1299

## 2012-09-20 HISTORY — DX: Cardiac arrhythmia, unspecified: I49.9

## 2012-09-20 HISTORY — DX: Acute myocardial infarction, unspecified: I21.9

## 2012-09-20 HISTORY — DX: Sleep apnea, unspecified: G47.30

## 2012-09-20 HISTORY — DX: Cardiac murmur, unspecified: R01.1

## 2012-09-20 SURGERY — CARDIOVERSION
Anesthesia: Monitor Anesthesia Care

## 2012-09-20 MED ORDER — SODIUM CHLORIDE 0.9 % IV SOLN
INTRAVENOUS | Status: DC
  Administered 2012-09-20: 500 mL via INTRAVENOUS

## 2012-09-20 NOTE — Interval H&P Note (Signed)
History and Physical Interval Note:  09/20/2012 11:13 AM  Earl Martin  has presented today for surgery, with the diagnosis of a-fib  The various methods of treatment have been discussed with the patient and family. After consideration of risks, benefits and other options for treatment, the patient has consented to  Procedure(s): CARDIOVERSION (N/A) as a surgical intervention .  The patient's history has been reviewed, patient examined, no change in status, stable for surgery.  I have reviewed the patient's chart and labs.  Questions were answered to the patient's satisfaction.     [name]

## 2012-09-20 NOTE — Anesthesia Postprocedure Evaluation (Signed)
  Anesthesia Post-op Note  Patient: Earl Martin  Procedure(s) Performed: Procedure(s): CARDIOVERSION (N/A)  Patient Location: Endoscopy Unit  Anesthesia Type:General  Level of Consciousness: awake and alert   Airway and Oxygen Therapy: Patient Spontanous Breathing  Post-op Pain: none  Post-op Assessment: Post-op Vital signs reviewed, Patient's Cardiovascular Status Stable, Respiratory Function Stable and Patent Airway  Post-op Vital Signs: Reviewed and stable  Complications: No apparent anesthesia complications

## 2012-09-20 NOTE — H&P (Signed)
  Please see office visit notes for complete details of HPI.  

## 2012-09-20 NOTE — Anesthesia Preprocedure Evaluation (Signed)
Anesthesia Evaluation  Patient identified by MRN, date of birth, ID band Patient awake    Reviewed: Allergy & Precautions, H&P , NPO status , Patient's Chart, lab work & pertinent test results, reviewed documented beta blocker date and time   Airway Mallampati: II TM Distance: >3 FB Neck ROM: Full    Dental  (+) Teeth Intact and Dental Advisory Given   Pulmonary sleep apnea and Continuous Positive Airway Pressure Ventilation ,          Cardiovascular hypertension, Pt. on medications + CAD, + Past MI and + Cardiac Stents + dysrhythmias Atrial Fibrillation     Neuro/Psych    GI/Hepatic   Endo/Other    Renal/GU      Musculoskeletal   Abdominal   Peds  Hematology   Anesthesia Other Findings   Reproductive/Obstetrics                           Anesthesia Physical Anesthesia Plan  ASA: III  Anesthesia Plan: General and MAC   Post-op Pain Management:    Induction: Intravenous  Airway Management Planned: Mask  Additional Equipment:   Intra-op Plan:   Post-operative Plan:   Informed Consent: I have reviewed the patients History and Physical, chart, labs and discussed the procedure including the risks, benefits and alternatives for the proposed anesthesia with the patient or authorized representative who has indicated his/her understanding and acceptance.   Dental advisory given  Plan Discussed with: CRNA, Anesthesiologist and Surgeon  Anesthesia Plan Comments:         Anesthesia Quick Evaluation

## 2012-09-20 NOTE — CV Procedure (Signed)
Direct current cardioversion:  Indication symptomatic A. Fibrillation.  Procedure: Using 150 mg of IV Propofol and 50 mg lidocaine  for achieving deep (Moderate sedation), synchronized direct current cardioversion performed. Patient was delivered with 150x1 then 200 Joules of electricity X 3 with no success in cardioversion. Patient contineud to be in A. Fib. Patient tolerated the procedure well. No immediate complication noted.

## 2012-09-20 NOTE — Transfer of Care (Signed)
Immediate Anesthesia Transfer of Care Note  Patient: Earl Martin  Procedure(s) Performed: Procedure(s): CARDIOVERSION (N/A)  Patient Location: PACU and Endoscopy Unit  Anesthesia Type:MAC  Level of Consciousness: awake and alert   Airway & Oxygen Therapy: Patient Spontanous Breathing and Patient connected to nasal cannula oxygen  Post-op Assessment: Report given to PACU RN and Post -op Vital signs reviewed and stable  Post vital signs: Reviewed and stable  Complications: No apparent anesthesia complications

## 2012-09-21 ENCOUNTER — Encounter (HOSPITAL_COMMUNITY): Payer: Self-pay | Admitting: Cardiology

## 2013-02-15 ENCOUNTER — Encounter (INDEPENDENT_AMBULATORY_CARE_PROVIDER_SITE_OTHER): Payer: Self-pay | Admitting: General Surgery

## 2013-02-15 ENCOUNTER — Ambulatory Visit (INDEPENDENT_AMBULATORY_CARE_PROVIDER_SITE_OTHER): Payer: No Typology Code available for payment source | Admitting: General Surgery

## 2013-02-15 VITALS — BP 118/82 | HR 60 | Temp 98.1°F | Resp 18 | Ht 72.0 in | Wt 311.0 lb

## 2013-02-15 DIAGNOSIS — K4090 Unilateral inguinal hernia, without obstruction or gangrene, not specified as recurrent: Secondary | ICD-10-CM

## 2013-02-15 NOTE — Progress Notes (Signed)
Patient ID: Earl Martin, male   DOB: 02/14/1961, 52 y.o.   MRN: [id number]  Chief Complaint  Patient presents with  . Inguinal Hernia    HPI Earl Martin is a 52 y.o. male.   HPI  52-year-old male who presents with a left groin bulge for about 2 months it is getting more bothersome. He has to reduce this frequently. He feels like he is a tennis ball in place at times. It comes out whenever he coughs or sneezes or has any increase in his intra-abdominal pressure. He has no difficulty or change in his bowel movements. He also has no difficulty with urination and no symptoms it sounds like he has any prostate disease. This is not getting better on own and he presents due to worsening discomfort.He has no history of a colonoscopy. He does have a history of cardiac stents 4 years ago with a myocardial infarction. He is on antiplatelet medications for his atrial fibrillation. He is doing well from his heart standpoint it sounds like recently. Past Medical History  Diagnosis Date  . Hypertension   . Dysrhythmia   . Myocardial infarction   . Sleep apnea   . Heart murmur   . Atrial fibrillation     Past Surgical History  Procedure Laterality Date  . Coronary stent placement      stents x3  . Hand nerve graft    . Rotator cuff repair    . Cardioversion N/A 09/20/2012    Procedure: CARDIOVERSION;  Surgeon: Jagadeesh R Ganji, MD;  Location: [facility] ENDOSCOPY;  Service: Cardiovascular;  Laterality: N/A;    No family history on file.  Social History History  Substance Use Topics  . Smoking status: Current Every Day Smoker -- 0.75 packs/day for 30 years    Types: Cigarettes  . Smokeless tobacco: Not on file  . Alcohol Use: 8.4 oz/week    14 Cans of beer per week    No Known Allergies  Current Outpatient Prescriptions  Medication Sig Dispense Refill  . amiodarone (PACERONE) 200 MG tablet Take 200 mg by mouth 2 (two) times daily.      . digoxin (LANOXIN) 0.25 MG tablet Take 0.25 mg by  mouth daily.      . lovastatin (MEVACOR) 40 MG tablet Take 40 mg by mouth at bedtime.      . metoprolol succinate (TOPROL-XL) 50 MG 24 hr tablet Take 50 mg by mouth 2 (two) times daily. Take with or immediately following a meal.      . Multiple Vitamin (MULTIVITAMIN) capsule Take 1 capsule by mouth daily.      . Olmesartan-Amlodipine-HCTZ 40-10-25 MG TABS Take by mouth daily.      . rivaroxaban (XARELTO) 10 MG TABS tablet Take 20 mg by mouth daily.        No current facility-administered medications for this visit.    Review of Systems Review of Systems  Constitutional: Negative for fever, chills and unexpected weight change.  HENT: Negative for hearing loss, congestion, sore throat, trouble swallowing and voice change.   Eyes: Negative for visual disturbance.  Respiratory: Negative for cough and wheezing.   Cardiovascular: Negative for chest pain, palpitations and leg swelling.  Gastrointestinal: Negative for nausea, vomiting, abdominal pain, diarrhea, constipation, blood in stool, abdominal distention, anal bleeding and rectal pain.  Genitourinary: Negative for hematuria and difficulty urinating.  Musculoskeletal: Negative for arthralgias.  Skin: Negative for rash and wound.  Neurological: Negative for seizures, syncope, weakness and headaches.  Hematological:   Negative for adenopathy. Does not bruise/bleed easily.  Psychiatric/Behavioral: Negative for confusion.    Blood pressure 118/82, pulse 60, temperature 98.1 F (36.7 C), temperature source Oral, resp. rate 18, height 6' (1.829 m), weight 311 lb (141.069 kg).  Physical Exam Physical Exam  Vitals reviewed. Constitutional: He appears well-developed and well-nourished.  Eyes: No scleral icterus.  Neck: Neck supple.  Cardiovascular: Normal rate and regular rhythm.   Murmur heard. Pulmonary/Chest: Effort normal and breath sounds normal. He has no wheezes. He has no rales.  Abdominal: Soft. Bowel sounds are normal. There is no  tenderness. A hernia is present. Hernia confirmed positive in the left inguinal area. Hernia confirmed negative in the right inguinal area.  Lymphadenopathy:    He has no cervical adenopathy.      Assessment    Left inguinal hernia    Plan    We discussed observation versus repair.  We discussed both laparoscopic and open inguinal hernia repairs. I described the procedure in detail.   Goals should be achieved with surgery. We discussed the usage of mesh and the rationale behind that. We went over the pathophysiology of inguinal hernia. We have elected to perform open inguinal hernia repair with mesh.  We discussed the risks including bleeding, infection, recurrence, postoperative pain and chronic groin pain, testicular injury, urinary retention, numbness in groin and around incision. We will stop his xarelto two days prior and restart when safe afterwards.  I will contact Dr Ganjis office for any other recommendations also.We discussed postop recovery and restrictions.         Earl Martin 02/15/2013, 9:52 AM    

## 2013-02-16 ENCOUNTER — Encounter (INDEPENDENT_AMBULATORY_CARE_PROVIDER_SITE_OTHER): Payer: Self-pay

## 2013-02-21 ENCOUNTER — Encounter (INDEPENDENT_AMBULATORY_CARE_PROVIDER_SITE_OTHER): Payer: Self-pay

## 2013-02-21 ENCOUNTER — Telehealth (INDEPENDENT_AMBULATORY_CARE_PROVIDER_SITE_OTHER): Payer: Self-pay

## 2013-02-21 NOTE — Telephone Encounter (Signed)
LMOM stating that we did receive cardiac clearance from Dr Verl Dicker office. I advised pt that he could stop the Xarelto 48hrs before surgery but he could stay on the baby aspirin 81mg  per Dr Jacinto Halim. I advised pt to call if any questions.

## 2013-03-02 ENCOUNTER — Encounter (HOSPITAL_COMMUNITY): Payer: Self-pay | Admitting: Pharmacy Technician

## 2013-03-06 ENCOUNTER — Ambulatory Visit (HOSPITAL_COMMUNITY)
Admission: RE | Admit: 2013-03-06 | Discharge: 2013-03-06 | Disposition: A | Discharge status: Home / Self Care | Payer: No Typology Code available for payment source | Source: Ambulatory Visit | Attending: Anesthesiology | Admitting: Anesthesiology

## 2013-03-06 ENCOUNTER — Other Ambulatory Visit (HOSPITAL_COMMUNITY): Payer: Self-pay | Admitting: *Deleted

## 2013-03-06 ENCOUNTER — Encounter (HOSPITAL_COMMUNITY)
Admission: RE | Admit: 2013-03-06 | Discharge: 2013-03-06 | Disposition: A | Discharge status: Home / Self Care | Payer: No Typology Code available for payment source | Source: Ambulatory Visit | Attending: General Surgery | Admitting: General Surgery

## 2013-03-06 ENCOUNTER — Encounter (HOSPITAL_COMMUNITY): Payer: Self-pay

## 2013-03-06 ENCOUNTER — Other Ambulatory Visit (HOSPITAL_COMMUNITY): Payer: BC Managed Care – PPO

## 2013-03-06 HISTORY — DX: Adverse effect of unspecified anesthetic, initial encounter: T41.45XA

## 2013-03-06 HISTORY — DX: Pneumonia, unspecified organism: J18.9

## 2013-03-06 HISTORY — DX: Other complications of anesthesia, initial encounter: T88.59XA

## 2013-03-06 LAB — CBC WITH DIFFERENTIAL/PLATELET
Basophils Relative: 1 % (ref 0–1)
Eosinophils Relative: 3 % (ref 0–5)
HCT: 50.3 % (ref 39.0–52.0)
Hemoglobin: 17.3 g/dL — ABNORMAL HIGH (ref 13.0–17.0)
Lymphocytes Relative: 24 % (ref 12–46)
Lymphs Abs: 2 10*3/uL (ref 0.7–4.0)
MCV: 88.2 fL (ref 78.0–100.0)
Monocytes Absolute: 0.8 10*3/uL (ref 0.1–1.0)
Platelets: 240 10*3/uL (ref 150–400)
RBC: 5.7 MIL/uL (ref 4.22–5.81)
WBC: 8.3 10*3/uL (ref 4.0–10.5)

## 2013-03-06 LAB — BASIC METABOLIC PANEL
CO2: 26 mEq/L (ref 19–32)
Calcium: 9.8 mg/dL (ref 8.4–10.5)
Chloride: 97 mEq/L (ref 96–112)
Creatinine, Ser: 0.77 mg/dL (ref 0.50–1.35)
Potassium: 4.8 mEq/L (ref 3.5–5.1)
Sodium: 133 mEq/L — ABNORMAL LOW (ref 135–145)

## 2013-03-06 MED ORDER — DEXTROSE 5 % IV SOLN
3.0000 g | INTRAVENOUS | Status: AC
  Administered 2013-03-07: 3 g via INTRAVENOUS
  Filled 2013-03-06: qty 3000

## 2013-03-06 NOTE — Progress Notes (Signed)
req'd ekg ,notes from dr Jacinto Halim recent

## 2013-03-06 NOTE — Pre-Procedure Instructions (Addendum)
Earl Martin  03/06/2013   Your procedure is scheduled on:  September 23. 2014 at 10:45 AM  Report to [facility] at 8:45 AM.  Call this number if you have problems the morning of surgery: [phone]   Remember:   Do not eat food or drink liquids after midnight.   Take these medicines the morning of surgery with A SIP OF WATER:  amiodarone (PACERONE), digoxin (LANOXIN), metoprolol (LOPRESSOR)             STOP xarelto now if not already done so     Do not wear jewelry, make-up or nail polish.  Do not wear lotions, powders, or perfumes. You may wear deodorant.  Do not shave 48 hours prior to surgery. Men may shave face and neck.  Do not bring valuables to the hospital.  [facility] is not responsible                   for any belongings or valuables.  Contacts, dentures or bridgework may not be worn into surgery.  Leave suitcase in the car. After surgery it may be brought to your room.  For patients admitted to the hospital, checkout time is 11:00 AM the day of  discharge.   Patients discharged the day of surgery will not be allowed to drive  home.  Name and phone number of your driver: Family/friend   Special Instructions: Shower using CHG 2 nights before surgery and the night before surgery.  If you shower the day of surgery use CHG.  Use special wash - you have one bottle of CHG for all showers.  You should use approximately 1/3 of the bottle for each shower.   Please read over the following fact sheets that you were given: Pain Booklet, Coughing and Deep Breathing and Surgical Site Infection Prevention

## 2013-03-07 ENCOUNTER — Ambulatory Visit (HOSPITAL_COMMUNITY)
Admission: RE | Admit: 2013-03-07 | Discharge: 2013-03-07 | Disposition: A | Discharge status: Home / Self Care | Payer: No Typology Code available for payment source | Source: Ambulatory Visit | Attending: General Surgery | Admitting: General Surgery

## 2013-03-07 ENCOUNTER — Encounter (HOSPITAL_COMMUNITY)
Admission: RE | Disposition: A | Discharge status: Home / Self Care | Payer: Self-pay | Source: Ambulatory Visit | Attending: General Surgery

## 2013-03-07 ENCOUNTER — Encounter (HOSPITAL_COMMUNITY): Payer: Self-pay | Admitting: Certified Registered"

## 2013-03-07 ENCOUNTER — Ambulatory Visit (HOSPITAL_COMMUNITY): Payer: No Typology Code available for payment source | Admitting: Certified Registered"

## 2013-03-07 ENCOUNTER — Encounter (HOSPITAL_COMMUNITY): Payer: Self-pay | Admitting: *Deleted

## 2013-03-07 DIAGNOSIS — G4730 Sleep apnea, unspecified: Secondary | ICD-10-CM | POA: Insufficient documentation

## 2013-03-07 DIAGNOSIS — K4090 Unilateral inguinal hernia, without obstruction or gangrene, not specified as recurrent: Secondary | ICD-10-CM | POA: Insufficient documentation

## 2013-03-07 DIAGNOSIS — I10 Essential (primary) hypertension: Secondary | ICD-10-CM | POA: Insufficient documentation

## 2013-03-07 DIAGNOSIS — I252 Old myocardial infarction: Secondary | ICD-10-CM | POA: Insufficient documentation

## 2013-03-07 DIAGNOSIS — I4891 Unspecified atrial fibrillation: Secondary | ICD-10-CM | POA: Insufficient documentation

## 2013-03-07 DIAGNOSIS — R011 Cardiac murmur, unspecified: Secondary | ICD-10-CM | POA: Insufficient documentation

## 2013-03-07 HISTORY — PX: INGUINAL HERNIA REPAIR: SHX194

## 2013-03-07 HISTORY — PX: INSERTION OF MESH: SHX5868

## 2013-03-07 SURGERY — REPAIR, HERNIA, INGUINAL, ADULT
Anesthesia: General | Site: Groin | Laterality: Left | Wound class: Clean

## 2013-03-07 MED ORDER — OXYCODONE HCL 5 MG PO TABS
ORAL_TABLET | ORAL | Status: AC
  Filled 2013-03-07: qty 1

## 2013-03-07 MED ORDER — ONDANSETRON HCL 4 MG/2ML IJ SOLN
INTRAMUSCULAR | Status: DC | PRN
  Administered 2013-03-07: 4 mg via INTRAVENOUS

## 2013-03-07 MED ORDER — NEOSTIGMINE METHYLSULFATE 1 MG/ML IJ SOLN
INTRAMUSCULAR | Status: DC | PRN
  Administered 2013-03-07: 3 mg via INTRAVENOUS

## 2013-03-07 MED ORDER — LIDOCAINE HCL (CARDIAC) 20 MG/ML IV SOLN
INTRAVENOUS | Status: DC | PRN
  Administered 2013-03-07: 70 mg via INTRAVENOUS

## 2013-03-07 MED ORDER — HYDROMORPHONE HCL PF 1 MG/ML IJ SOLN
0.2500 mg | INTRAMUSCULAR | Status: DC | PRN
  Administered 2013-03-07 (×2): 0.5 mg via INTRAVENOUS

## 2013-03-07 MED ORDER — ONDANSETRON HCL 4 MG/2ML IJ SOLN: 4.0000 mg | Freq: Four times a day (QID) | INTRAMUSCULAR | Status: DC | PRN

## 2013-03-07 MED ORDER — OXYCODONE HCL 5 MG PO TABS
5.0000 mg | ORAL_TABLET | Freq: Once | ORAL | Status: AC | PRN
  Administered 2013-03-07: 5 mg via ORAL

## 2013-03-07 MED ORDER — PHENYLEPHRINE HCL 10 MG/ML IJ SOLN
INTRAMUSCULAR | Status: DC | PRN
  Administered 2013-03-07 (×3): 80 ug via INTRAVENOUS

## 2013-03-07 MED ORDER — LACTATED RINGERS IV SOLN
INTRAVENOUS | Status: DC
  Administered 2013-03-07: 10:00:00 via INTRAVENOUS

## 2013-03-07 MED ORDER — GLYCOPYRROLATE 0.2 MG/ML IJ SOLN
INTRAMUSCULAR | Status: DC | PRN
  Administered 2013-03-07: 0.4 mg via INTRAVENOUS

## 2013-03-07 MED ORDER — ROCURONIUM BROMIDE 100 MG/10ML IV SOLN
INTRAVENOUS | Status: DC | PRN
  Administered 2013-03-07: 50 mg via INTRAVENOUS

## 2013-03-07 MED ORDER — OXYCODONE HCL 5 MG/5ML PO SOLN: 5.0000 mg | Freq: Once | ORAL | Status: AC | PRN

## 2013-03-07 MED ORDER — MIDAZOLAM HCL 5 MG/5ML IJ SOLN
INTRAMUSCULAR | Status: DC | PRN
  Administered 2013-03-07: 2 mg via INTRAVENOUS

## 2013-03-07 MED ORDER — PROPOFOL 10 MG/ML IV BOLUS
INTRAVENOUS | Status: DC | PRN
  Administered 2013-03-07: 200 mg via INTRAVENOUS

## 2013-03-07 MED ORDER — HYDROMORPHONE HCL PF 1 MG/ML IJ SOLN
INTRAMUSCULAR | Status: AC
  Filled 2013-03-07: qty 1

## 2013-03-07 MED ORDER — 0.9 % SODIUM CHLORIDE (POUR BTL) OPTIME
TOPICAL | Status: DC | PRN
  Administered 2013-03-07: 1000 mL

## 2013-03-07 MED ORDER — OXYCODONE-ACETAMINOPHEN 10-325 MG PO TABS: 1.0000 | ORAL_TABLET | Freq: Four times a day (QID) | ORAL | Status: DC | PRN

## 2013-03-07 MED ORDER — FENTANYL CITRATE 0.05 MG/ML IJ SOLN
INTRAMUSCULAR | Status: DC | PRN
  Administered 2013-03-07 (×2): 50 ug via INTRAVENOUS

## 2013-03-07 MED ORDER — BUPIVACAINE HCL (PF) 0.25 % IJ SOLN
INTRAMUSCULAR | Status: DC | PRN
  Administered 2013-03-07: 30 mL

## 2013-03-07 SURGICAL SUPPLY — 53 items
BLADE SURG 10 STRL SS (BLADE) ×2 IMPLANT
BLADE SURG 15 STRL LF DISP TIS (BLADE) ×1 IMPLANT
BLADE SURG 15 STRL SS (BLADE) ×1
BLADE SURG ROTATE 9660 (MISCELLANEOUS) ×2 IMPLANT
CANISTER SUCTION 2500CC (MISCELLANEOUS) IMPLANT
CHLORAPREP W/TINT 26ML (MISCELLANEOUS) ×2 IMPLANT
CLOTH BEACON ORANGE TIMEOUT ST (SAFETY) IMPLANT
COVER SURGICAL LIGHT HANDLE (MISCELLANEOUS) ×2 IMPLANT
DECANTER SPIKE VIAL GLASS SM (MISCELLANEOUS) IMPLANT
DERMABOND ADHESIVE PROPEN (GAUZE/BANDAGES/DRESSINGS) ×1
DERMABOND ADVANCED (GAUZE/BANDAGES/DRESSINGS) ×1
DERMABOND ADVANCED .7 DNX12 (GAUZE/BANDAGES/DRESSINGS) ×1 IMPLANT
DERMABOND ADVANCED .7 DNX6 (GAUZE/BANDAGES/DRESSINGS) ×1 IMPLANT
DRAIN PENROSE 1/2X12 LTX STRL (WOUND CARE) ×2 IMPLANT
DRAPE LAPAROTOMY TRNSV 102X78 (DRAPE) ×2 IMPLANT
ELECT CAUTERY BLADE 6.4 (BLADE) ×2 IMPLANT
ELECT REM PT RETURN 9FT ADLT (ELECTROSURGICAL) ×2
ELECTRODE REM PT RTRN 9FT ADLT (ELECTROSURGICAL) ×1 IMPLANT
GAUZE SPONGE 4X4 16PLY XRAY LF (GAUZE/BANDAGES/DRESSINGS) ×2 IMPLANT
GLOVE BIO SURGEON STRL SZ7 (GLOVE) ×2 IMPLANT
GLOVE BIO SURGEON STRL SZ8 (GLOVE) ×2 IMPLANT
GLOVE BIOGEL PI IND STRL 7.0 (GLOVE) ×1 IMPLANT
GLOVE BIOGEL PI IND STRL 7.5 (GLOVE) ×1 IMPLANT
GLOVE BIOGEL PI IND STRL 8 (GLOVE) ×1 IMPLANT
GLOVE BIOGEL PI INDICATOR 7.0 (GLOVE) ×1
GLOVE BIOGEL PI INDICATOR 7.5 (GLOVE) ×1
GLOVE BIOGEL PI INDICATOR 8 (GLOVE) ×1
GLOVE SURG SS PI 7.0 STRL IVOR (GLOVE) ×2 IMPLANT
GOWN PREVENTION PLUS XLARGE (GOWN DISPOSABLE) ×2 IMPLANT
GOWN STRL NON-REIN LRG LVL3 (GOWN DISPOSABLE) ×6 IMPLANT
KIT BASIN OR (CUSTOM PROCEDURE TRAY) ×2 IMPLANT
KIT ROOM TURNOVER OR (KITS) ×2 IMPLANT
MESH HERNIA SYS ULTRAPRO LRG (Mesh General) ×2 IMPLANT
NEEDLE HYPO 25GX1X1/2 BEV (NEEDLE) ×2 IMPLANT
NS IRRIG 1000ML POUR BTL (IV SOLUTION) ×2 IMPLANT
PACK SURGICAL SETUP 50X90 (CUSTOM PROCEDURE TRAY) ×2 IMPLANT
PAD ARMBOARD 7.5X6 YLW CONV (MISCELLANEOUS) ×2 IMPLANT
PENCIL BUTTON HOLSTER BLD 10FT (ELECTRODE) ×2 IMPLANT
SPONGE LAP 18X18 X RAY DECT (DISPOSABLE) IMPLANT
SUT MNCRL AB 4-0 PS2 18 (SUTURE) ×2 IMPLANT
SUT VIC AB 0 CT1 27 (SUTURE) ×1
SUT VIC AB 0 CT1 27XBRD ANBCTR (SUTURE) ×1 IMPLANT
SUT VIC AB 2-0 CT1 27 (SUTURE) ×1
SUT VIC AB 2-0 CT1 TAPERPNT 27 (SUTURE) ×1 IMPLANT
SUT VIC AB 2-0 SH 18 (SUTURE) ×4 IMPLANT
SUT VIC AB 3-0 SH 27 (SUTURE) ×1
SUT VIC AB 3-0 SH 27XBRD (SUTURE) ×1 IMPLANT
SUT VICRYL AB 2 0 TIES (SUTURE) ×2 IMPLANT
SYR CONTROL 10ML LL (SYRINGE) ×2 IMPLANT
TOWEL OR 17X24 6PK STRL BLUE (TOWEL DISPOSABLE) ×2 IMPLANT
TOWEL OR 17X26 10 PK STRL BLUE (TOWEL DISPOSABLE) ×2 IMPLANT
TUBE CONNECTING 12X1/4 (SUCTIONS) ×2 IMPLANT
YANKAUER SUCT BULB TIP NO VENT (SUCTIONS) ×2 IMPLANT

## 2013-03-07 NOTE — Anesthesia Preprocedure Evaluation (Signed)
Anesthesia Evaluation  Patient identified by MRN, date of birth, ID band Patient awake    Reviewed: Allergy & Precautions, H&P , NPO status , Patient's Chart, lab work & pertinent test results  Airway Mallampati: II  Neck ROM: full    Dental   Pulmonary sleep apnea , Current Smoker, PE         Cardiovascular hypertension, + CAD, + Past MI and + Cardiac Stents + dysrhythmias Atrial Fibrillation     Neuro/Psych    GI/Hepatic   Endo/Other  Morbid obesity  Renal/GU      Musculoskeletal   Abdominal   Peds  Hematology   Anesthesia Other Findings   Reproductive/Obstetrics                           Anesthesia Physical Anesthesia Plan  ASA: III  Anesthesia Plan: General   Post-op Pain Management:    Induction: Intravenous  Airway Management Planned: Oral ETT  Additional Equipment:   Intra-op Plan:   Post-operative Plan: Extubation in OR  Informed Consent: I have reviewed the patients History and Physical, chart, labs and discussed the procedure including the risks, benefits and alternatives for the proposed anesthesia with the patient or authorized representative who has indicated his/her understanding and acceptance.     Plan Discussed with: CRNA, Anesthesiologist and Surgeon  Anesthesia Plan Comments:         Anesthesia Quick Evaluation

## 2013-03-07 NOTE — Transfer of Care (Signed)
Immediate Anesthesia Transfer of Care Note  Patient: Earl Martin  Procedure(s) Performed: Procedure(s): LEFT INGUINAL HERNIA REPAIR (Left) INSERTION OF MESH (Left)  Patient Location: PACU  Anesthesia Type:General  Level of Consciousness: awake, alert  and oriented  Airway & Oxygen Therapy: Patient Spontanous Breathing and Patient connected to nasal cannula oxygen  Post-op Assessment: Report given to PACU RN and Post -op Vital signs reviewed and stable  Post vital signs: Reviewed and stable  Complications: No apparent anesthesia complications

## 2013-03-07 NOTE — Interval H&P Note (Signed)
History and Physical Interval Note:  03/07/2013 11:08 AM  Earl Martin  has presented today for surgery, with the diagnosis of left ingunial hernia  The various methods of treatment have been discussed with the patient and family. After consideration of risks, benefits and other options for treatment, the patient has consented to  Procedure(s): LEFT INGUINAL HERNIA REPAIR (Left) INSERTION OF MESH (N/A) as a surgical intervention .  The patient's history has been reviewed, patient examined, no change in status, stable for surgery.  I have reviewed the patient's chart and labs.  Questions were answered to the patient's satisfaction.     Marcelino Campos

## 2013-03-07 NOTE — Progress Notes (Signed)
Encouraged c/db 

## 2013-03-07 NOTE — H&P (View-Only) (Signed)
Patient ID: Earl Martin, male   DOB: 1961-01-02, 52 y.o.   MRN: [id number]  Chief Complaint  Patient presents with  . Inguinal Hernia    HPI Earl Martin is a 52 y.o. male.   HPI  52 year old male who presents with a left groin bulge for about 2 months it is getting more bothersome. He has to reduce this frequently. He feels like he is a tennis ball in place at times. It comes out whenever he coughs or sneezes or has any increase in his intra-abdominal pressure. He has no difficulty or change in his bowel movements. He also has no difficulty with urination and no symptoms it sounds like he has any prostate disease. This is not getting better on own and he presents due to worsening discomfort.He has no history of a colonoscopy. He does have a history of cardiac stents 4 years ago with a myocardial infarction. He is on antiplatelet medications for his atrial fibrillation. He is doing well from his heart standpoint it sounds like recently. Past Medical History  Diagnosis Date  . Hypertension   . Dysrhythmia   . Myocardial infarction   . Sleep apnea   . Heart murmur   . Atrial fibrillation     Past Surgical History  Procedure Laterality Date  . Coronary stent placement      stents x3  . Hand nerve graft    . Rotator cuff repair    . Cardioversion N/A 09/20/2012    Procedure: CARDIOVERSION;  Surgeon: [name], MD;  Location: [facility] ENDOSCOPY;  Service: Cardiovascular;  Laterality: N/A;    No family history on file.  Social History History  Substance Use Topics  . Smoking status: Current Every Day Smoker -- 0.75 packs/day for 30 years    Types: Cigarettes  . Smokeless tobacco: Not on file  . Alcohol Use: 8.4 oz/week    14 Cans of beer per week    No Known Allergies  Current Outpatient Prescriptions  Medication Sig Dispense Refill  . amiodarone (PACERONE) 200 MG tablet Take 200 mg by mouth 2 (two) times daily.      . digoxin (LANOXIN) 0.25 MG tablet Take 0.25 mg by  mouth daily.      Marland Kitchen lovastatin (MEVACOR) 40 MG tablet Take 40 mg by mouth at bedtime.      . metoprolol succinate (TOPROL-XL) 50 MG 24 hr tablet Take 50 mg by mouth 2 (two) times daily. Take with or immediately following a meal.      . Multiple Vitamin (MULTIVITAMIN) capsule Take 1 capsule by mouth daily.      . Olmesartan-Amlodipine-HCTZ 40-10-25 MG TABS Take by mouth daily.      . rivaroxaban (XARELTO) 10 MG TABS tablet Take 20 mg by mouth daily.        No current facility-administered medications for this visit.    Review of Systems Review of Systems  Constitutional: Negative for fever, chills and unexpected weight change.  HENT: Negative for hearing loss, congestion, sore throat, trouble swallowing and voice change.   Eyes: Negative for visual disturbance.  Respiratory: Negative for cough and wheezing.   Cardiovascular: Negative for chest pain, palpitations and leg swelling.  Gastrointestinal: Negative for nausea, vomiting, abdominal pain, diarrhea, constipation, blood in stool, abdominal distention, anal bleeding and rectal pain.  Genitourinary: Negative for hematuria and difficulty urinating.  Musculoskeletal: Negative for arthralgias.  Skin: Negative for rash and wound.  Neurological: Negative for seizures, syncope, weakness and headaches.  Hematological:  Negative for adenopathy. Does not bruise/bleed easily.  Psychiatric/Behavioral: Negative for confusion.    Blood pressure 118/82, pulse 60, temperature 98.1 F (36.7 C), temperature source Oral, resp. rate 18, height 6' (1.829 m), weight 311 lb (141.069 kg).  Physical Exam Physical Exam  Vitals reviewed. Constitutional: He appears well-developed and well-nourished.  Eyes: No scleral icterus.  Neck: Neck supple.  Cardiovascular: Normal rate and regular rhythm.   Murmur heard. Pulmonary/Chest: Effort normal and breath sounds normal. He has no wheezes. He has no rales.  Abdominal: Soft. Bowel sounds are normal. There is no  tenderness. A hernia is present. Hernia confirmed positive in the left inguinal area. Hernia confirmed negative in the right inguinal area.  Lymphadenopathy:    He has no cervical adenopathy.      Assessment    Left inguinal hernia    Plan    We discussed observation versus repair.  We discussed both laparoscopic and open inguinal hernia repairs. I described the procedure in detail.   Goals should be achieved with surgery. We discussed the usage of mesh and the rationale behind that. We went over the pathophysiology of inguinal hernia. We have elected to perform open inguinal hernia repair with mesh.  We discussed the risks including bleeding, infection, recurrence, postoperative pain and chronic groin pain, testicular injury, urinary retention, numbness in groin and around incision. We will stop his xarelto two days prior and restart when safe afterwards.  I will contact Dr Jerre Simon office for any other recommendations also.We discussed postop recovery and restrictions.         [name] 02/15/2013, 9:52 AM

## 2013-03-07 NOTE — Anesthesia Procedure Notes (Signed)
Procedure Name: Intubation Date/Time: 03/07/2013 11:21 AM Performed by: Rogelia Boga Pre-anesthesia Checklist: Patient identified, Emergency Drugs available, Suction available, Patient being monitored and Timeout performed Patient Re-evaluated:Patient Re-evaluated prior to inductionOxygen Delivery Method: Circle system utilized Preoxygenation: Pre-oxygenation with 100% oxygen Intubation Type: IV induction Ventilation: Mask ventilation without difficulty and Oral airway inserted - appropriate to patient size Laryngoscope Size: Mac and 4 Grade View: Grade II Tube type: Oral Number of attempts: 1 Airway Equipment and Method: Stylet Placement Confirmation: ETT inserted through vocal cords under direct vision,  positive ETCO2 and breath sounds checked- equal and bilateral Secured at: 22 cm Tube secured with: Tape Dental Injury: Teeth and Oropharynx as per pre-operative assessment

## 2013-03-07 NOTE — Op Note (Signed)
Preoperative diagnosis: Left inguinal hernia Postoperative diagnosis: Indirect left hernia Procedure: Left inguinal hernia repair with UltraPro hernia system Surgeon: Dr Harden Mo Anesthesia: Gen. Estimated blood loss: Minimal Complications: None Drains: None Specimens: None Condition to recovery in stable condition Sponge and needle count correct at end  Indications: This is a 53 year old male presented a month history of a left groin bulge. On his exam he appeared a left inguinal hernia. I discussed all the options and decided to proceed with an open left inguinal hernia repair with mesh. We discussed the risks and benefits prior to beginning.  Procedure: After informed consent was obtained the patient was taken to the operating room. He was given 2 g of cefazolin. Sequential compression devices were placed. He was placed under general anesthesia without complication. His left groin was prepped and draped in the standard sterile surgical fashion. A surgical timeout was then performed.  I made an incision in his left groin. This case was difficult due to his body habitus. I carried this down to the external oblique fascia. I entered this sharply through the external ring. I then encircled the spermatic cord with a Penrose drain. Once this was completed he had a weak floor and he had a fairly large indirect inguinal hernia. This was reduced in its entirety and I developed the preperitoneal space. I elected to use an UltraPro hernia system and placed this into position. I rolled up the bottom portion of the bilayer this was in good position. I then closed the floor to the inguinal ligament with 2-0 Vicryl suture. I then deployed the top portion and laid flat. I made a cut and wrapped this around the spermatic cord. I then sutured this in numerous positions including twice to the pubic tubercle with 2-0 Vicryl suture. This was sutured about every centimeter inferiorly to the inguinal ligament.  There was no further hernia. We tested this with a Valsalva. Hemostasis was observed. I then closed the external oblique fascia with a 2-0 Vicryl. I closed the dead space down with another 2-0 Vicryl and Scarpas with a 3-0 Vicryl. I then closed the skin with a 4-0 Monocryl and placed Dermabond over the incision. I injected Marcaine throughout this region. He tolerated this well was extubated and transferred to the recovery room in stable condition.

## 2013-03-07 NOTE — Anesthesia Postprocedure Evaluation (Signed)
  Anesthesia Post-op Note  Patient: Earl Martin  Procedure(s) Performed: Procedure(s): LEFT INGUINAL HERNIA REPAIR (Left) INSERTION OF MESH (Left)  Patient Location: PACU  Anesthesia Type:General  Level of Consciousness: awake, alert  and oriented  Airway and Oxygen Therapy: Patient Spontanous Breathing and Patient connected to nasal cannula oxygen  Post-op Pain: mild  Post-op Assessment: Post-op Vital signs reviewed  Post-op Vital Signs: Reviewed  Complications: No apparent anesthesia complications

## 2013-03-09 ENCOUNTER — Encounter (HOSPITAL_COMMUNITY): Payer: Self-pay | Admitting: General Surgery

## 2013-03-31 ENCOUNTER — Encounter (INDEPENDENT_AMBULATORY_CARE_PROVIDER_SITE_OTHER): Payer: No Typology Code available for payment source | Admitting: General Surgery

## 2013-04-25 ENCOUNTER — Ambulatory Visit (INDEPENDENT_AMBULATORY_CARE_PROVIDER_SITE_OTHER): Payer: No Typology Code available for payment source | Admitting: General Surgery

## 2013-04-25 ENCOUNTER — Encounter (INDEPENDENT_AMBULATORY_CARE_PROVIDER_SITE_OTHER): Payer: Self-pay | Admitting: General Surgery

## 2013-04-25 VITALS — BP 138/80 | HR 90 | Temp 98.0°F | Resp 20 | Ht 72.0 in | Wt 312.6 lb

## 2013-04-25 DIAGNOSIS — Z09 Encounter for follow-up examination after completed treatment for conditions other than malignant neoplasm: Secondary | ICD-10-CM

## 2013-04-25 MED ORDER — OXYCODONE-ACETAMINOPHEN 10-325 MG PO TABS: 1.0000 | ORAL_TABLET | Freq: Four times a day (QID) | ORAL | Status: AC | PRN

## 2013-04-25 NOTE — Progress Notes (Signed)
Subjective:     Patient ID: Earl Martin, male   DOB: Feb 15, 1961, 52 y.o.   MRN: [id number]  HPI This is a 52 year old male who is status post a left inguinal hernia repair with mesh about 3 weeks ago. This area is healing well and he arrives today without any real significant complaints. He still has some occasional soreness after this after he exerts himself. He is otherwise doing well. He is having normal bowel movements and voiding well. He is back on his xarelto without difficulty.  Review of Systems     Objective:   Physical Exam Healing left groin incision without any evidence of infection or any edema    Assessment:     Status post left hernia repair     Plan:     I think he has a normal postoperative course. I did give him some more pain pills a day as he does have some occasional pain and he is taking some of those. I think he should be done with those in the next several weeks. I asked him to come back and see me as needed now. He can start increasing his activity as tolerated.

## 2014-11-19 ENCOUNTER — Other Ambulatory Visit: Payer: Self-pay | Admitting: Cardiology

## 2014-11-19 DIAGNOSIS — I714 Abdominal aortic aneurysm, without rupture, unspecified: Secondary | ICD-10-CM

## 2014-11-26 ENCOUNTER — Ambulatory Visit (INDEPENDENT_AMBULATORY_CARE_PROVIDER_SITE_OTHER): Payer: 59 | Admitting: Surgery

## 2014-11-26 ENCOUNTER — Ambulatory Visit
Admission: RE | Admit: 2014-11-26 | Discharge: 2014-11-26 | Disposition: A | Discharge status: Home / Self Care | Payer: No Typology Code available for payment source | Source: Ambulatory Visit | Attending: Cardiology | Admitting: Cardiology

## 2014-11-26 ENCOUNTER — Ambulatory Visit
Admission: RE | Admit: 2014-11-26 | Discharge: 2014-11-26 | Disposition: A | Discharge status: Home / Self Care | Payer: 59 | Source: Ambulatory Visit | Attending: Cardiology | Admitting: Cardiology

## 2014-11-26 ENCOUNTER — Other Ambulatory Visit: Payer: Self-pay

## 2014-11-26 ENCOUNTER — Encounter: Payer: Self-pay | Admitting: Surgery

## 2014-11-26 VITALS — BP 124/76 | HR 82 | Temp 97.8°F | Resp 16 | Ht 72.0 in | Wt 211.0 lb

## 2014-11-26 DIAGNOSIS — I714 Abdominal aortic aneurysm, without rupture, unspecified: Secondary | ICD-10-CM

## 2014-11-26 MED ORDER — IOPAMIDOL (ISOVUE-370) INJECTION 76%
75.0000 mL | Freq: Once | INTRAVENOUS | Status: AC | PRN
  Administered 2014-11-26: 75 mL via INTRAVENOUS

## 2014-11-26 NOTE — Progress Notes (Signed)
   Patient name: Earl Martin MRN: [id number] DOB: 10/08/1960 Sex: male   Referred by: Martin  Reason for referral:  Chief Complaint  Patient presents with  . AAA    Ref- Dr. Ganji     HISTORY OF PRESENT ILLNESS: This is a 54-year-old gentleman who was recently found to have a pulsatile mass on physical examination.  This led to an abdominal ultrasound which showed a 5.7 cm infrarenal abdominal aortic aneurysm.  A CT Earl Martin was then ordered and he was referred to me.  The patient denies any abdominal pain or back pain.  The patient suffers from coronary artery disease.  He suffered a MI and had coronary stenting performed.  He also has atrial fibrillation.  He did not convert after cardioversion and is therefore on Xaralto.  He has hypertension which is medically managed with an ACE inhibitor.  He suffers from hypercholesterolemia which is treated with a statin.  He continues to smoke but would like to stop.  Past Medical History  Diagnosis Date  . Hypertension   . Dysrhythmia   . Myocardial infarction   . Heart murmur   . Atrial fibrillation   . Complication of anesthesia     belligerant when awakes  . Pneumonia     hx  . Sleep apnea     does not use cpap    Past Surgical History  Procedure Laterality Date  . Coronary stent placement      stents x3  . Hand nerve graft    . Rotator cuff repair    . Cardioversion N/A 09/20/2012    Procedure: CARDIOVERSION;  Surgeon: Earl R Ganji, MD;  Location: [facility] ENDOSCOPY;  Service: Cardiovascular;  Laterality: N/A;  . Inguinal hernia repair Left 03/07/2013    Procedure: LEFT INGUINAL HERNIA REPAIR;  Surgeon: Earl Wakefield, MD;  Location: [facility] OR;  Service: General;  Laterality: Left;  . Insertion of mesh Left 03/07/2013    Procedure: INSERTION OF MESH;  Surgeon: Earl Wakefield, MD;  Location: [facility] OR;  Service: General;  Laterality: Left;    History   Social History  . Marital Status: Legally Separated    Spouse  Name: N/A  . Number of Children: N/A  . Years of Education: N/A   Occupational History  . Not on file.   Social History Main Topics  . Smoking status: Current Every Day Smoker -- 0.75 packs/day for 30 years    Types: Cigarettes  . Smokeless tobacco: Not on file  . Alcohol Use: 8.4 oz/week    14 Cans of beer per week  . Drug Use: No  . Sexual Activity: Not on file   Other Topics Concern  . Not on file   Social History Narrative  . No narrative on file    No family history on file.  Allergies as of 11/26/2014  . (No Known Allergies)    Current Outpatient Prescriptions on File Prior to Visit  Medication Sig Dispense Refill  . digoxin (LANOXIN) 0.25 MG tablet Take 0.25 mg by mouth daily.    . lovastatin (MEVACOR) 40 MG tablet Take 40 mg by mouth at bedtime.    . metoprolol (LOPRESSOR) 50 MG tablet Take 100 mg by mouth 2 (two) times daily.     . Multiple Vitamin (MULTIVITAMIN) capsule Take 1 capsule by mouth daily.    . Rivaroxaban (XARELTO) 20 MG TABS tablet Take 20 mg by mouth daily.    . zolpidem (AMBIEN) 10 MG tablet Take   10 mg by mouth at bedtime as needed for sleep.    . amiodarone (PACERONE) 200 MG tablet Take 100 mg by mouth every evening.    . aspirin 81 MG tablet Take 81 mg by mouth daily. Differing doses    . Olmesartan-Amlodipine-HCTZ 40-10-25 MG TABS Take 1 tablet by mouth daily.      No current facility-administered medications on file prior to visit.     REVIEW OF SYSTEMS: Cardiovascular: No chest pain, chest pressure, palpitations, orthopnea, or dyspnea on exertion. No claudication or rest pain,  No history of DVT or phlebitis. Pulmonary: No productive cough, asthma or wheezing. Neurologic: No weakness, paresthesias, aphasia, or amaurosis. No dizziness. Hematologic: No bleeding problems or clotting disorders. Musculoskeletal: No joint pain or joint swelling. Gastrointestinal: No blood in stool or hematemesis Genitourinary: No dysuria or  hematuria. Psychiatric:: No history of major depression. Integumentary: No rashes or ulcers. Constitutional: No fever or chills.  PHYSICAL EXAMINATION:  Filed Vitals:   11/26/14 1539  BP: 124/76  Pulse: 82  Temp: 97.8 F (36.6 C)  TempSrc: Oral  Resp: 16  Height: 6' (1.829 m)  Weight: 211 lb (95.709 kg)  SpO2: 99%   Body mass index is 28.61 kg/(m^2). General: The patient appears their stated age.   HEENT:  No gross abnormalities Pulmonary: Respirations are non-labored Abdomen: Soft and non-tender.  Pulsatile mass without tenderness Musculoskeletal: There are no major deformities.   Neurologic: No focal weakness or paresthesias are detected, Skin: There are no ulcer or rashes noted. Psychiatric: The patient has normal affect. Cardiovascular: There is a regular rate and rhythm without significant murmur appreciated.  Palpable pedal pulses.  No carotid bruits  Diagnostic Studies: I have reviewed his CT scan with the following findings: 1. 6.3 cm infrarenal fusiform aortic aneurysm extending to the bifurcation. 2. Dilated common iliac arteries right 2.2 cm, left 1.8 cm.  Carotid duplex shows no significant carotid stenosis Lower extremity Doppler studies show normal triphasic waveforms and normal ABIs  Assessment:  6.3 cm abdominal aortic aneurysm Plan: The patient would like to get this fixed as soon as possible.  I have scheduled him for repair on Thursday, June 16.  We discussed the risks and benefits of the operation including the risk of lower extremity ischemia, intestinal ischemia, renal dysfunction, cardiopulmonary complications, and the need for long-term surveillance.  The patient wants to get this done.  He will stop his Xaralto today.  He artery has obtained cardiac clearance from Dr.Ganji     Earl Martin, M.D. [facility] Office: [phone] Pager:  [phone]   

## 2014-11-28 ENCOUNTER — Encounter (HOSPITAL_COMMUNITY): Payer: Self-pay | Admitting: *Deleted

## 2014-11-28 MED ORDER — DEXTROSE 5 % IV SOLN
1.5000 g | INTRAVENOUS | Status: AC
  Administered 2014-11-29: 1.5 g via INTRAVENOUS
  Filled 2014-11-28: qty 1.5

## 2014-11-28 NOTE — Progress Notes (Addendum)
Pt denies SOB and chest pain but is under the care of Dr. Jacinto Halim, cardiology. Pt stated that he had an EKG and labs done recently; records requested. Spoke with Okey Regal,  RN at Dr. Estanislado Spire office to clarify pt instructions regarding Lisinopril on DOS. According to Okey Regal, it is okay for pt to hold Lisinopril on DOS.  Pt advised to take Digoxin and Metoprolol with a sip of water and to stop taking NSAID's, otc vitamins and herbal medications. Pt stated that he stopped taking his Xarelto on 6/13 as instructed. Pt stated that his Aspirin was discontinued by the MD. Pt chart forwarded to Rica Mast, NP, anesthesia for review of cardiac history.

## 2014-11-28 NOTE — Progress Notes (Addendum)
Anesthesia Chart Review:  Pt is 54 year old male scheduled for abdominal aortic endovascular stent graft on 11/29/2014 with Dr. Myra Gianotti.   Pt is same day work up.   Cardiologist is Dr. Jacinto Halim.   PMH includes: CAD (s/p mid RCA non drug eluting stent, staged proximal LAD DES stent 2012), MI (2012), HTN, heart murmur, OSA, atrial fiibrillation (s/p failed cardioversion 09/20/12), hyperlipidemia, AAA. Current smoker. BMI 29.  S/p L inguinal hernia repair 03/07/13.   Echo 03/24/2012: -LV internal dimension is mildly dilated. Dilated cardiomyopathy. Mild concentric hypertrophy. Mild global hypokinesis. Mild decreased systolic global function. Calculated EF 46%.  -LA cavity severely dilated.  -RA cavity severely dilated.  -RV ventricular cavity is mildly to moderately enlarged. Normal global wall motion -Mild mitral thickening. Mild to moderate posteriorly directed mitral regurgitation. I cannot completely exclude presence of MVP.  -Insignificant pericardial effusion  Nuclear stress test 06/03/2012: -LV was dilated at both rest and stress.  -LV EF was low normal at 55% -This represents a low risk study.   Dr. Estanislado Spire note indicates Dr. Jacinto Halim referred pt for surgery and that Dr. Jacinto Halim has cleared pt for surgery.   Rica Mast, FNP-BC [facility]/Anesthesiology Phone: [phone] 11/28/2014 4:54 PM  Addendum: Just received additional most recent records from [place] [facility] including:  - 11/16/14 EKG: Afib, consider septal infarct (age undetermined), rightward axis, non-specific inferior ST/T wave abnormality.  - 11/19/14 Nuclear stress test: EKG: Afib, non-specific ST/T wave abnormality. Stress EKG is non-diagnostic for ischemia. Perfusion imaging demonstrates the LV to be dilated both at rest and stress. Raw images reveal mild diaphragmatic attenuation. Perfusion imaging study demonstrates inferior wall decreased uptake, extending from the base to the apex including apical  anterior and apical wall. There is no statistically significant reversibility. This represents a soft tissue attenuation artifact, however a small to moderate sized scar without significant reversibility cannot be excluded. Dynamic images reveal normal wall motion with preserved LVEF of 58%. This represents a low risk study. Compared to 06/03/12, no significant change.  - Clearance note stating patient low risk for perioperative CV complication with permission to hold Xarelto for two days prior to surgery.  Velna Ochs [facility]/Anesthesiology Phone [phone] 11/28/2014 5:19 PM

## 2014-11-29 ENCOUNTER — Encounter (HOSPITAL_COMMUNITY)
Admission: RE | Disposition: A | Discharge status: Home / Self Care | Payer: Self-pay | Source: Ambulatory Visit | Attending: Surgery

## 2014-11-29 ENCOUNTER — Inpatient Hospital Stay (HOSPITAL_COMMUNITY): Payer: 59

## 2014-11-29 ENCOUNTER — Encounter (HOSPITAL_COMMUNITY): Payer: Self-pay | Admitting: Anesthesiology

## 2014-11-29 ENCOUNTER — Inpatient Hospital Stay (HOSPITAL_COMMUNITY)
Admission: RE | Admit: 2014-11-29 | Discharge: 2014-11-30 | DRG: 269 | Disposition: A | Discharge status: Home / Self Care | Payer: 59 | Source: Ambulatory Visit | Attending: Surgery | Admitting: Surgery

## 2014-11-29 ENCOUNTER — Inpatient Hospital Stay (HOSPITAL_COMMUNITY): Payer: 59 | Admitting: Emergency Medicine

## 2014-11-29 DIAGNOSIS — I714 Abdominal aortic aneurysm, without rupture, unspecified: Secondary | ICD-10-CM | POA: Diagnosis present

## 2014-11-29 DIAGNOSIS — Z419 Encounter for procedure for purposes other than remedying health state, unspecified: Secondary | ICD-10-CM

## 2014-11-29 DIAGNOSIS — I10 Essential (primary) hypertension: Secondary | ICD-10-CM | POA: Diagnosis not present

## 2014-11-29 DIAGNOSIS — Z9889 Other specified postprocedural states: Secondary | ICD-10-CM

## 2014-11-29 DIAGNOSIS — G4730 Sleep apnea, unspecified: Secondary | ICD-10-CM | POA: Diagnosis present

## 2014-11-29 DIAGNOSIS — Z7901 Long term (current) use of anticoagulants: Secondary | ICD-10-CM | POA: Diagnosis not present

## 2014-11-29 DIAGNOSIS — I2510 Atherosclerotic heart disease of native coronary artery without angina pectoris: Secondary | ICD-10-CM | POA: Diagnosis not present

## 2014-11-29 DIAGNOSIS — Z7982 Long term (current) use of aspirin: Secondary | ICD-10-CM

## 2014-11-29 DIAGNOSIS — F17210 Nicotine dependence, cigarettes, uncomplicated: Secondary | ICD-10-CM | POA: Diagnosis present

## 2014-11-29 DIAGNOSIS — E780 Pure hypercholesterolemia: Secondary | ICD-10-CM | POA: Diagnosis present

## 2014-11-29 DIAGNOSIS — Z79899 Other long term (current) drug therapy: Secondary | ICD-10-CM

## 2014-11-29 DIAGNOSIS — Z955 Presence of coronary angioplasty implant and graft: Secondary | ICD-10-CM | POA: Diagnosis not present

## 2014-11-29 DIAGNOSIS — I4891 Unspecified atrial fibrillation: Secondary | ICD-10-CM | POA: Diagnosis present

## 2014-11-29 HISTORY — PX: ABDOMINAL AORTIC ENDOVASCULAR STENT GRAFT: SHX5707

## 2014-11-29 HISTORY — DX: Headache, unspecified: R51.9

## 2014-11-29 HISTORY — DX: Pure hypercholesterolemia, unspecified: E78.00

## 2014-11-29 HISTORY — DX: Headache: R51

## 2014-11-29 HISTORY — DX: Atherosclerotic heart disease of native coronary artery without angina pectoris: I25.10

## 2014-11-29 LAB — BLOOD GAS, ARTERIAL
Acid-base deficit: 0.6 mmol/L (ref 0.0–2.0)
BICARBONATE: 23.3 meq/L (ref 20.0–24.0)
O2 SAT: 95.4 %
PATIENT TEMPERATURE: 98.6
PH ART: 7.42 (ref 7.350–7.450)
TCO2: 24.5 mmol/L (ref 0–100)
pCO2 arterial: 36.6 mmHg (ref 35.0–45.0)
pO2, Arterial: 71.5 mmHg — ABNORMAL LOW (ref 80.0–100.0)

## 2014-11-29 LAB — COMPREHENSIVE METABOLIC PANEL
ALBUMIN: 3.4 g/dL — AB (ref 3.5–5.0)
ALT: 19 U/L (ref 17–63)
AST: 21 U/L (ref 15–41)
Alkaline Phosphatase: 34 U/L — ABNORMAL LOW (ref 38–126)
Anion gap: 9 (ref 5–15)
BUN: 13 mg/dL (ref 6–20)
CO2: 21 mmol/L — ABNORMAL LOW (ref 22–32)
Calcium: 8.5 mg/dL — ABNORMAL LOW (ref 8.9–10.3)
Chloride: 103 mmol/L (ref 101–111)
Creatinine, Ser: 0.61 mg/dL (ref 0.61–1.24)
GFR calc non Af Amer: >60 mL/min (ref >60)
GLUCOSE: 99 mg/dL (ref 65–99)
POTASSIUM: 4.1 mmol/L (ref 3.5–5.1)
Sodium: 133 mmol/L — ABNORMAL LOW (ref 135–145)
TOTAL PROTEIN: 5.8 g/dL — AB (ref 6.5–8.1)
Total Bilirubin: 0.7 mg/dL (ref 0.3–1.2)

## 2014-11-29 LAB — URINALYSIS, ROUTINE W REFLEX MICROSCOPIC
BILIRUBIN URINE: NEGATIVE
Glucose, UA: NEGATIVE mg/dL
HGB URINE DIPSTICK: NEGATIVE
Ketones, ur: NEGATIVE mg/dL
Leukocytes, UA: NEGATIVE
Nitrite: NEGATIVE
PH: 7.5 (ref 5.0–8.0)
Protein, ur: NEGATIVE mg/dL
Specific Gravity, Urine: 1.02 (ref 1.005–1.030)
Urobilinogen, UA: 1 mg/dL (ref 0.0–1.0)

## 2014-11-29 LAB — CBC
HCT: 37.8 % — ABNORMAL LOW (ref 39.0–52.0)
HEMATOCRIT: 34.7 % — AB (ref 39.0–52.0)
Hemoglobin: 13.2 g/dL (ref 13.0–17.0)
Hemoglobin: 12 g/dL — ABNORMAL LOW (ref 13.0–17.0)
MCH: 31.1 pg (ref 26.0–34.0)
MCH: 30.9 pg (ref 26.0–34.0)
MCHC: 34.9 g/dL (ref 30.0–36.0)
MCHC: 34.6 g/dL (ref 30.0–36.0)
MCV: 89.2 fL (ref 78.0–100.0)
MCV: 89.4 fL (ref 78.0–100.0)
PLATELETS: 198 10*3/uL (ref 150–400)
Platelets: 175 10*3/uL (ref 150–400)
RBC: 4.24 MIL/uL (ref 4.22–5.81)
RBC: 3.88 MIL/uL — ABNORMAL LOW (ref 4.22–5.81)
RDW: 13 % (ref 11.5–15.5)
RDW: 13 % (ref 11.5–15.5)
WBC: 6.6 10*3/uL (ref 4.0–10.5)
WBC: 5.4 10*3/uL (ref 4.0–10.5)

## 2014-11-29 LAB — BASIC METABOLIC PANEL
ANION GAP: 4 — AB (ref 5–15)
BUN: 10 mg/dL (ref 6–20)
CHLORIDE: 106 mmol/L (ref 101–111)
CO2: 25 mmol/L (ref 22–32)
CREATININE: 0.54 mg/dL — AB (ref 0.61–1.24)
Calcium: 8.2 mg/dL — ABNORMAL LOW (ref 8.9–10.3)
GFR calc Af Amer: >60 mL/min (ref >60)
GFR calc non Af Amer: >60 mL/min (ref >60)
Glucose, Bld: 111 mg/dL — ABNORMAL HIGH (ref 65–99)
POTASSIUM: 4.1 mmol/L (ref 3.5–5.1)
Sodium: 135 mmol/L (ref 135–145)

## 2014-11-29 LAB — SURGICAL PCR SCREEN
MRSA, PCR: NEGATIVE
STAPHYLOCOCCUS AUREUS: NEGATIVE

## 2014-11-29 LAB — MAGNESIUM: MAGNESIUM: 1.6 mg/dL — AB (ref 1.7–2.4)

## 2014-11-29 LAB — PROTIME-INR
INR: 1.04 (ref 0.00–1.49)
INR: 1.16 (ref 0.00–1.49)
PROTHROMBIN TIME: 15 s (ref 11.6–15.2)
Prothrombin Time: 13.8 seconds (ref 11.6–15.2)

## 2014-11-29 LAB — ABO/RH: ABO/RH(D): A POS

## 2014-11-29 LAB — TYPE AND SCREEN
ABO/RH(D): A POS
ANTIBODY SCREEN: NEGATIVE

## 2014-11-29 LAB — APTT
APTT: 30 s (ref 24–37)
aPTT: 34 seconds (ref 24–37)

## 2014-11-29 SURGERY — INSERTION, ENDOVASCULAR STENT GRAFT, AORTA, ABDOMINAL
Anesthesia: General

## 2014-11-29 MED ORDER — LABETALOL HCL 5 MG/ML IV SOLN: 10.0000 mg | INTRAVENOUS | Status: DC | PRN

## 2014-11-29 MED ORDER — GUAIFENESIN-DM 100-10 MG/5ML PO SYRP: 15.0000 mL | ORAL_SOLUTION | ORAL | Status: DC | PRN

## 2014-11-29 MED ORDER — MIDAZOLAM HCL 2 MG/2ML IJ SOLN
INTRAMUSCULAR | Status: AC
  Filled 2014-11-29: qty 2

## 2014-11-29 MED ORDER — NEOSTIGMINE METHYLSULFATE 10 MG/10ML IV SOLN
INTRAVENOUS | Status: DC | PRN
  Administered 2014-11-29: 4 mg via INTRAVENOUS

## 2014-11-29 MED ORDER — MUPIROCIN 2 % EX OINT
1.0000 "application " | TOPICAL_OINTMENT | Freq: Once | CUTANEOUS | Status: AC
  Administered 2014-11-29: 1 via TOPICAL
  Filled 2014-11-29: qty 22

## 2014-11-29 MED ORDER — OXYCODONE HCL 5 MG PO TABS: 5.0000 mg | ORAL_TABLET | Freq: Once | ORAL | Status: DC | PRN

## 2014-11-29 MED ORDER — FENTANYL CITRATE (PF) 100 MCG/2ML IJ SOLN
INTRAMUSCULAR | Status: AC
  Filled 2014-11-29: qty 2

## 2014-11-29 MED ORDER — HYDROCHLOROTHIAZIDE 25 MG PO TABS
25.0000 mg | ORAL_TABLET | Freq: Every day | ORAL | Status: DC | PRN
  Filled 2014-11-29: qty 1

## 2014-11-29 MED ORDER — RIVAROXABAN 20 MG PO TABS
20.0000 mg | ORAL_TABLET | Freq: Every day | ORAL | Status: DC
  Filled 2014-11-29: qty 1

## 2014-11-29 MED ORDER — METOPROLOL TARTRATE 100 MG PO TABS: 100.0000 mg | ORAL_TABLET | Freq: Two times a day (BID) | ORAL | Status: DC

## 2014-11-29 MED ORDER — PROMETHAZINE HCL 25 MG/ML IJ SOLN: 6.2500 mg | INTRAMUSCULAR | Status: DC | PRN

## 2014-11-29 MED ORDER — NEOSTIGMINE METHYLSULFATE 10 MG/10ML IV SOLN
INTRAVENOUS | Status: AC
  Filled 2014-11-29: qty 1

## 2014-11-29 MED ORDER — PHENOL 1.4 % MT LIQD: 1.0000 | OROMUCOSAL | Status: DC | PRN

## 2014-11-29 MED ORDER — LACTATED RINGERS IV SOLN
INTRAVENOUS | Status: DC
  Administered 2014-11-29 (×2): via INTRAVENOUS

## 2014-11-29 MED ORDER — HYDRALAZINE HCL 20 MG/ML IJ SOLN: 5.0000 mg | INTRAMUSCULAR | Status: DC | PRN

## 2014-11-29 MED ORDER — ROCURONIUM BROMIDE 50 MG/5ML IV SOLN
INTRAVENOUS | Status: AC
  Filled 2014-11-29: qty 1

## 2014-11-29 MED ORDER — ONDANSETRON HCL 4 MG/2ML IJ SOLN: 4.0000 mg | Freq: Four times a day (QID) | INTRAMUSCULAR | Status: DC | PRN

## 2014-11-29 MED ORDER — PHENYLEPHRINE HCL 10 MG/ML IJ SOLN
10.0000 mg | INTRAVENOUS | Status: DC | PRN
  Administered 2014-11-29: 20 ug/min via INTRAVENOUS

## 2014-11-29 MED ORDER — SODIUM CHLORIDE 0.9 % IV SOLN: INTRAVENOUS | Status: DC

## 2014-11-29 MED ORDER — NITROGLYCERIN 0.4 MG SL SUBL: 0.4000 mg | SUBLINGUAL_TABLET | SUBLINGUAL | Status: DC | PRN

## 2014-11-29 MED ORDER — ACETAMINOPHEN 325 MG PO TABS: 325.0000 mg | ORAL_TABLET | ORAL | Status: DC | PRN

## 2014-11-29 MED ORDER — GLYCOPYRROLATE 0.2 MG/ML IJ SOLN
INTRAMUSCULAR | Status: DC | PRN
  Administered 2014-11-29: 0.6 mg via INTRAVENOUS

## 2014-11-29 MED ORDER — OXYCODONE-ACETAMINOPHEN 5-325 MG PO TABS
1.0000 | ORAL_TABLET | ORAL | Status: DC | PRN
  Administered 2014-11-29 – 2014-11-30 (×3): 2 via ORAL
  Filled 2014-11-29 (×4): qty 2

## 2014-11-29 MED ORDER — ROCURONIUM BROMIDE 100 MG/10ML IV SOLN
INTRAVENOUS | Status: DC | PRN
  Administered 2014-11-29: 50 mg via INTRAVENOUS

## 2014-11-29 MED ORDER — SODIUM CHLORIDE 0.9 % IJ SOLN
INTRAMUSCULAR | Status: AC
  Filled 2014-11-29: qty 20

## 2014-11-29 MED ORDER — ZOLPIDEM TARTRATE 5 MG PO TABS: 10.0000 mg | ORAL_TABLET | Freq: Every evening | ORAL | Status: DC | PRN

## 2014-11-29 MED ORDER — VECURONIUM BROMIDE 10 MG IV SOLR
INTRAVENOUS | Status: DC | PRN
  Administered 2014-11-29: 1 mg via INTRAVENOUS
  Administered 2014-11-29: 2 mg via INTRAVENOUS
  Administered 2014-11-29 (×3): 1 mg via INTRAVENOUS

## 2014-11-29 MED ORDER — EPHEDRINE SULFATE 50 MG/ML IJ SOLN
INTRAMUSCULAR | Status: AC
  Filled 2014-11-29: qty 1

## 2014-11-29 MED ORDER — MORPHINE SULFATE 2 MG/ML IJ SOLN
2.0000 mg | INTRAMUSCULAR | Status: DC | PRN
  Administered 2014-11-29: 4 mg via INTRAVENOUS
  Filled 2014-11-29: qty 2

## 2014-11-29 MED ORDER — LIDOCAINE HCL 4 % MT SOLN
OROMUCOSAL | Status: DC | PRN
  Administered 2014-11-29: 4 mL via TOPICAL

## 2014-11-29 MED ORDER — AMIODARONE HCL 100 MG PO TABS
100.0000 mg | ORAL_TABLET | Freq: Every evening | ORAL | Status: DC
  Administered 2014-11-29: 100 mg via ORAL
  Filled 2014-11-29 (×2): qty 1

## 2014-11-29 MED ORDER — METOPROLOL TARTRATE 1 MG/ML IV SOLN
2.0000 mg | INTRAVENOUS | Status: DC | PRN
Start: 2014-11-29 — End: 2014-11-30

## 2014-11-29 MED ORDER — DIGOXIN 250 MCG PO TABS
0.2500 mg | ORAL_TABLET | Freq: Every day | ORAL | Status: DC
  Filled 2014-11-29: qty 1

## 2014-11-29 MED ORDER — ARTIFICIAL TEARS OP OINT
TOPICAL_OINTMENT | OPHTHALMIC | Status: AC
  Filled 2014-11-29: qty 3.5

## 2014-11-29 MED ORDER — HYDROMORPHONE HCL 1 MG/ML IJ SOLN: 0.2500 mg | INTRAMUSCULAR | Status: DC | PRN

## 2014-11-29 MED ORDER — LIDOCAINE HCL (CARDIAC) 20 MG/ML IV SOLN
INTRAVENOUS | Status: AC
  Filled 2014-11-29: qty 10

## 2014-11-29 MED ORDER — VECURONIUM BROMIDE 10 MG IV SOLR
INTRAVENOUS | Status: AC
  Filled 2014-11-29: qty 10

## 2014-11-29 MED ORDER — PROPOFOL 10 MG/ML IV BOLUS
INTRAVENOUS | Status: DC | PRN
  Administered 2014-11-29: 200 mg via INTRAVENOUS

## 2014-11-29 MED ORDER — CEFUROXIME SODIUM 1.5 G IJ SOLR
1.5000 g | Freq: Two times a day (BID) | INTRAMUSCULAR | Status: DC
  Administered 2014-11-29: 1.5 g via INTRAVENOUS
  Filled 2014-11-29 (×3): qty 1.5

## 2014-11-29 MED ORDER — IODIXANOL 320 MG/ML IV SOLN
INTRAVENOUS | Status: DC | PRN
  Administered 2014-11-29: 150 mL via INTRAVENOUS

## 2014-11-29 MED ORDER — LISINOPRIL 20 MG PO TABS
20.0000 mg | ORAL_TABLET | Freq: Every day | ORAL | Status: DC
  Administered 2014-11-29: 20 mg via ORAL
  Filled 2014-11-29 (×2): qty 1

## 2014-11-29 MED ORDER — GLYCOPYRROLATE 0.2 MG/ML IJ SOLN
INTRAMUSCULAR | Status: AC
  Filled 2014-11-29: qty 5

## 2014-11-29 MED ORDER — PROTAMINE SULFATE 10 MG/ML IV SOLN
INTRAVENOUS | Status: DC | PRN
  Administered 2014-11-29: 20 mg via INTRAVENOUS
  Administered 2014-11-29: 10 mg via INTRAVENOUS
  Administered 2014-11-29: 20 mg via INTRAVENOUS

## 2014-11-29 MED ORDER — SODIUM CHLORIDE 0.9 % IR SOLN
Status: DC | PRN
  Administered 2014-11-29: 08:00:00

## 2014-11-29 MED ORDER — SUCCINYLCHOLINE CHLORIDE 20 MG/ML IJ SOLN
INTRAMUSCULAR | Status: AC
  Filled 2014-11-29: qty 1

## 2014-11-29 MED ORDER — STERILE WATER FOR INJECTION IJ SOLN
INTRAMUSCULAR | Status: AC
  Filled 2014-11-29: qty 10

## 2014-11-29 MED ORDER — ONDANSETRON HCL 4 MG/2ML IJ SOLN
INTRAMUSCULAR | Status: AC
  Filled 2014-11-29: qty 2

## 2014-11-29 MED ORDER — CHLORHEXIDINE GLUCONATE CLOTH 2 % EX PADS: 6.0000 | MEDICATED_PAD | Freq: Once | CUTANEOUS | Status: DC

## 2014-11-29 MED ORDER — DEXMEDETOMIDINE HCL IN NACL 200 MCG/50ML IV SOLN
INTRAVENOUS | Status: AC
  Filled 2014-11-29: qty 50

## 2014-11-29 MED ORDER — OXYCODONE HCL 5 MG/5ML PO SOLN: 5.0000 mg | Freq: Once | ORAL | Status: DC | PRN

## 2014-11-29 MED ORDER — PRAVASTATIN SODIUM 40 MG PO TABS
40.0000 mg | ORAL_TABLET | Freq: Every day | ORAL | Status: DC
  Administered 2014-11-29: 40 mg via ORAL
  Filled 2014-11-29 (×2): qty 1

## 2014-11-29 MED ORDER — METOPROLOL TARTRATE 25 MG PO TABS
25.0000 mg | ORAL_TABLET | Freq: Two times a day (BID) | ORAL | Status: DC
  Administered 2014-11-29: 25 mg via ORAL
  Filled 2014-11-29 (×4): qty 1

## 2014-11-29 MED ORDER — ONDANSETRON HCL 4 MG/2ML IJ SOLN
INTRAMUSCULAR | Status: DC | PRN
  Administered 2014-11-29: 4 mg via INTRAVENOUS

## 2014-11-29 MED ORDER — HEPARIN SODIUM (PORCINE) 1000 UNIT/ML IJ SOLN
INTRAMUSCULAR | Status: DC | PRN
  Administered 2014-11-29: 8000 [IU] via INTRAVENOUS

## 2014-11-29 MED ORDER — PANTOPRAZOLE SODIUM 40 MG PO TBEC
40.0000 mg | DELAYED_RELEASE_TABLET | Freq: Every day | ORAL | Status: DC
  Administered 2014-11-29: 40 mg via ORAL
  Filled 2014-11-29: qty 1

## 2014-11-29 MED ORDER — DEXMEDETOMIDINE HCL IN NACL 200 MCG/50ML IV SOLN
INTRAVENOUS | Status: DC | PRN
  Administered 2014-11-29: .4 ug/kg/h via INTRAVENOUS

## 2014-11-29 MED ORDER — FENTANYL CITRATE (PF) 100 MCG/2ML IJ SOLN
INTRAMUSCULAR | Status: DC | PRN
  Administered 2014-11-29: 50 ug via INTRAVENOUS
  Administered 2014-11-29: 100 ug via INTRAVENOUS
  Administered 2014-11-29: 50 ug via INTRAVENOUS

## 2014-11-29 MED ORDER — LIDOCAINE HCL (CARDIAC) 20 MG/ML IV SOLN
INTRAVENOUS | Status: DC | PRN
  Administered 2014-11-29: 40 mg via INTRAVENOUS

## 2014-11-29 MED ORDER — SENNOSIDES-DOCUSATE SODIUM 8.6-50 MG PO TABS
1.0000 | ORAL_TABLET | Freq: Every evening | ORAL | Status: DC | PRN
Start: 2014-11-29 — End: 2014-11-30
  Filled 2014-11-29: qty 1

## 2014-11-29 MED ORDER — POTASSIUM CHLORIDE CRYS ER 20 MEQ PO TBCR
20.0000 meq | EXTENDED_RELEASE_TABLET | Freq: Every day | ORAL | Status: DC | PRN
Start: 2014-11-29 — End: 2014-11-30

## 2014-11-29 MED ORDER — FENTANYL CITRATE (PF) 250 MCG/5ML IJ SOLN
INTRAMUSCULAR | Status: AC
  Filled 2014-11-29: qty 5

## 2014-11-29 MED ORDER — DOCUSATE SODIUM 100 MG PO CAPS
100.0000 mg | ORAL_CAPSULE | Freq: Every day | ORAL | Status: DC
Start: 2014-11-30 — End: 2014-11-30

## 2014-11-29 MED ORDER — 0.9 % SODIUM CHLORIDE (POUR BTL) OPTIME
TOPICAL | Status: DC | PRN
  Administered 2014-11-29: 1000 mL

## 2014-11-29 MED ORDER — MAGNESIUM SULFATE 2 GM/50ML IV SOLN: 2.0000 g | Freq: Every day | INTRAVENOUS | Status: DC | PRN

## 2014-11-29 MED ORDER — BISACODYL 10 MG RE SUPP: 10.0000 mg | Freq: Every day | RECTAL | Status: DC | PRN

## 2014-11-29 MED ORDER — MORPHINE SULFATE 4 MG/ML IJ SOLN
INTRAMUSCULAR | Status: AC
  Administered 2014-11-29: 4 mg
  Filled 2014-11-29: qty 1

## 2014-11-29 MED ORDER — ACETAMINOPHEN 650 MG RE SUPP: 325.0000 mg | RECTAL | Status: DC | PRN

## 2014-11-29 MED ORDER — PROPOFOL 10 MG/ML IV BOLUS
INTRAVENOUS | Status: AC
  Filled 2014-11-29: qty 20

## 2014-11-29 MED ORDER — ALUM & MAG HYDROXIDE-SIMETH 200-200-20 MG/5ML PO SUSP: 15.0000 mL | ORAL | Status: DC | PRN

## 2014-11-29 MED ORDER — SODIUM CHLORIDE 0.9 % IV SOLN: 500.0000 mL | Freq: Once | INTRAVENOUS | Status: AC | PRN

## 2014-11-29 MED ORDER — MIDAZOLAM HCL 5 MG/5ML IJ SOLN
INTRAMUSCULAR | Status: DC | PRN
  Administered 2014-11-29 (×4): 1 mg via INTRAVENOUS

## 2014-11-29 SURGICAL SUPPLY — 66 items
BLADE SURG CLIPPER 3M 9600 (MISCELLANEOUS) IMPLANT
CANISTER SUCTION 2500CC (MISCELLANEOUS) ×2 IMPLANT
CATH ANGIO 5F BER2 65CM (CATHETERS) ×2 IMPLANT
CATH BEACON 5.038 65CM KMP-01 (CATHETERS) IMPLANT
CATH OMNI FLUSH .035X70CM (CATHETERS) ×2 IMPLANT
CATH STRAIGHT 5FR 65CM (CATHETERS) ×2 IMPLANT
COVER PROBE W GEL 5X96 (DRAPES) ×2 IMPLANT
DEVICE CLOSURE PERCLS PRGLD 6F (VASCULAR PRODUCTS) ×6 IMPLANT
DEVICE TORQUE H2O (MISCELLANEOUS) ×2 IMPLANT
DRAPE C-ARM 42X72 X-RAY (DRAPES) ×2 IMPLANT
DRAPE ZERO GRAVITY STERILE (DRAPES) ×2 IMPLANT
DRSG TEGADERM 2-3/8X2-3/4 SM (GAUZE/BANDAGES/DRESSINGS) ×4 IMPLANT
DRYSEAL FLEXSHEATH 14FR 33CM (SHEATH) ×2
ELECT REM PT RETURN 9FT ADLT (ELECTROSURGICAL) ×4
ELECTRODE REM PT RTRN 9FT ADLT (ELECTROSURGICAL) ×2 IMPLANT
EXCLUDER TNK 26X14.5MMX12CM (Endovascular Graft) ×1 IMPLANT
EXCLUDER TRUNK 26X14.5MMX12CM (Endovascular Graft) ×2 IMPLANT
GAUZE SPONGE 2X2 8PLY STRL LF (GAUZE/BANDAGES/DRESSINGS) ×2 IMPLANT
GLOVE BIOGEL PI IND STRL 6.5 (GLOVE) ×1 IMPLANT
GLOVE BIOGEL PI IND STRL 7.0 (GLOVE) ×1 IMPLANT
GLOVE BIOGEL PI IND STRL 7.5 (GLOVE) ×2 IMPLANT
GLOVE BIOGEL PI INDICATOR 6.5 (GLOVE) ×1
GLOVE BIOGEL PI INDICATOR 7.0 (GLOVE) ×1
GLOVE BIOGEL PI INDICATOR 7.5 (GLOVE) ×2
GLOVE ECLIPSE 7.0 STRL STRAW (GLOVE) ×2 IMPLANT
GLOVE ECLIPSE 7.5 STRL STRAW (GLOVE) ×2 IMPLANT
GLOVE SS BIOGEL STRL SZ 7 (GLOVE) ×1 IMPLANT
GLOVE SUPERSENSE BIOGEL SZ 7 (GLOVE) ×1
GLOVE SURG SS PI 7.5 STRL IVOR (GLOVE) ×2 IMPLANT
GOWN STRL REUS W/ TWL LRG LVL3 (GOWN DISPOSABLE) ×2 IMPLANT
GOWN STRL REUS W/ TWL XL LVL3 (GOWN DISPOSABLE) ×2 IMPLANT
GOWN STRL REUS W/TWL LRG LVL3 (GOWN DISPOSABLE) ×2
GOWN STRL REUS W/TWL XL LVL3 (GOWN DISPOSABLE) ×2
GRAFT BALLN CATH 65CM (STENTS) IMPLANT
GUIDEWIRE ANGLED .035X150CM (WIRE) ×2 IMPLANT
HEMOSTAT SNOW SURGICEL 2X4 (HEMOSTASIS) IMPLANT
KIT BASIN OR (CUSTOM PROCEDURE TRAY) ×2 IMPLANT
KIT ROOM TURNOVER OR (KITS) ×2 IMPLANT
LEG CONTRALATERAL 16X18X9.5 (Endovascular Graft) ×2 IMPLANT
LEG CONTRALATERAL 23X14 (Endovascular Graft) ×2 IMPLANT
LIQUID BAND (GAUZE/BANDAGES/DRESSINGS) ×2 IMPLANT
NEEDLE PERC 18GX7CM (NEEDLE) ×2 IMPLANT
NS IRRIG 1000ML POUR BTL (IV SOLUTION) ×2 IMPLANT
PACK ENDOVASCULAR (PACKS) ×2 IMPLANT
PAD ARMBOARD 7.5X6 YLW CONV (MISCELLANEOUS) ×4 IMPLANT
PERCLOSE PROGLIDE 6F (VASCULAR PRODUCTS) ×12
SHEATH AVANTI 11CM 8FR (MISCELLANEOUS) ×2 IMPLANT
SHEATH BRITE TIP 8FR 23CM (MISCELLANEOUS) ×2 IMPLANT
SHEATH DRYSEAL FLEX 14FR 33CM (SHEATH) ×2 IMPLANT
SHIELD RADPAD SCOOP 12X17 (MISCELLANEOUS) ×4 IMPLANT
SPONGE GAUZE 2X2 STER 10/PKG (GAUZE/BANDAGES/DRESSINGS) ×2
STENT GRAFT BALLN CATH 65CM (STENTS)
STOPCOCK MORSE 400PSI 3WAY (MISCELLANEOUS) ×2 IMPLANT
SUT ETHILON 3 0 PS 1 (SUTURE) IMPLANT
SUT PROLENE 5 0 C 1 24 (SUTURE) IMPLANT
SUT VIC AB 2-0 CT1 27 (SUTURE)
SUT VIC AB 2-0 CT1 TAPERPNT 27 (SUTURE) IMPLANT
SUT VIC AB 3-0 SH 27 (SUTURE)
SUT VIC AB 3-0 SH 27X BRD (SUTURE) IMPLANT
SUT VICRYL 4-0 PS2 18IN ABS (SUTURE) ×4 IMPLANT
SYR 30ML LL (SYRINGE) ×2 IMPLANT
TOWEL OR 17X26 10 PK STRL BLUE (TOWEL DISPOSABLE) ×2 IMPLANT
TRAY FOLEY W/METER SILVER 16FR (SET/KITS/TRAYS/PACK) ×2 IMPLANT
TUBING HIGH PRESSURE 120CM (CONNECTOR) ×4 IMPLANT
WIRE AMPLATZ SS-J .035X180CM (WIRE) ×4 IMPLANT
WIRE BENTSON .035X145CM (WIRE) ×4 IMPLANT

## 2014-11-29 NOTE — Transfer of Care (Signed)
Immediate Anesthesia Transfer of Care Note  Patient: Earl Martin  Procedure(s) Performed: Procedure(s): ABDOMINAL AORTIC ENDOVASCULAR STENT GRAFT (N/A)  Patient Location: PACU  Anesthesia Type:General  Level of Consciousness: awake and alert   Airway & Oxygen Therapy: Patient Spontanous Breathing and Patient connected to nasal cannula oxygen  Post-op Assessment: Report given to RN and Post -op Vital signs reviewed and stable  Post vital signs: Reviewed and stable  Last Vitals:  Filed Vitals:   11/29/14 0710  BP: 105/62  Pulse:   Temp:   Resp:     Complications: No apparent anesthesia complications

## 2014-11-29 NOTE — OR Nursing (Signed)
Proglide closure devices utilized. Patient may ambulate @1500 , on 11/29/2014.

## 2014-11-29 NOTE — Op Note (Signed)
OPERATIVE REPORT  Date of Surgery: 11/29/2014  Surgeon: Josephina Gip, MD  Co-surgeon-James Marlynn Perking M.D.  Pre-op Diagnosis: Abdominal aortic aneurysm I71.4  Post-op Diagnosis: Abdominal aortic aneurysm I71.4  Procedure: Procedure(s): ABDOMINAL AORTIC ENDOVASCULAR STENT GRAFT-Gore -C3-aorto by common iliac  Anesthesia: General   Complications: None  Procedure Details:  Dr. Myra Gianotti will dictate the details of the surgical procedure. I served as a co-surgeon. I cannulated the contralateral gate-right side for insertion of the contralateral limb of the aortobiiliac stent graft. Also performed closure of the left common femoral artery using probe glide device 2. Excellent hemostasis achieved. Left inguinal wound closed in a subcuticular fashion with 3-0 Vicryl Patient tolerated procedure well and other details will be described in full by Dr. Juanito Doom, MD 11/29/2014 10:41 AM

## 2014-11-29 NOTE — Anesthesia Preprocedure Evaluation (Addendum)
Anesthesia Evaluation  Patient identified by MRN, date of birth, ID band Patient awake    Reviewed: Allergy & Precautions, NPO status , Patient's Chart, lab work & pertinent test results, reviewed documented beta blocker date and time   Airway Mallampati: II  TM Distance: >3 FB Neck ROM: Full    Dental  (+) Teeth Intact, Dental Advisory Given   Pulmonary sleep apnea , Current Smoker,  breath sounds clear to auscultation        Cardiovascular hypertension, Pt. on medications and Pt. on home beta blockers + CAD, + Past MI, + Cardiac Stents and + Peripheral Vascular Disease + dysrhythmias Atrial Fibrillation Rhythm:Regular Rate:Normal     Neuro/Psych negative neurological ROS     GI/Hepatic negative GI ROS, Neg liver ROS,   Endo/Other  negative endocrine ROS  Renal/GU negative Renal ROS     Musculoskeletal   Abdominal   Peds  Hematology negative hematology ROS (+)   Anesthesia Other Findings   Reproductive/Obstetrics                           Anesthesia Physical Anesthesia Plan  ASA: III  Anesthesia Plan: General   Post-op Pain Management:    Induction: Intravenous  Airway Management Planned: Oral ETT  Additional Equipment: Arterial line  Intra-op Plan:   Post-operative Plan: Extubation in OR  Informed Consent: I have reviewed the patients History and Physical, chart, labs and discussed the procedure including the risks, benefits and alternatives for the proposed anesthesia with the patient or authorized representative who has indicated his/her understanding and acceptance.   Dental advisory given  Plan Discussed with: CRNA  Anesthesia Plan Comments:         Anesthesia Quick Evaluation

## 2014-11-29 NOTE — Progress Notes (Signed)
  [facility] Day of Surgery  Feeling uncomfortable laying on back due to "bulging discs." Wants his foley catheter out.  Bilateral groins soft without hematoma. Palpable dorsalis pedis pulses bilaterally.  Stable post-op D/C foley Mobilize in am   Maris Berger, PA-C [facility] Office: [phone] Pager: [phone] 11/29/2014 5:11 PM

## 2014-11-29 NOTE — Anesthesia Procedure Notes (Signed)
Procedure Name: Intubation Date/Time: 11/29/2014 8:51 AM Performed by: Suzy Bouchard Pre-anesthesia Checklist: Patient identified, Timeout performed, Emergency Drugs available, Suction available and Patient being monitored Patient Re-evaluated:Patient Re-evaluated prior to inductionOxygen Delivery Method: Circle system utilized Preoxygenation: Pre-oxygenation with 100% oxygen Intubation Type: IV induction Ventilation: Mask ventilation without difficulty Laryngoscope Size: Miller and 2 Grade View: Grade II Tube type: Oral Tube size: 7.5 mm Number of attempts: 1 Airway Equipment and Method: Stylet and LTA kit utilized Placement Confirmation: ETT inserted through vocal cords under direct vision,  breath sounds checked- equal and bilateral and positive ETCO2 Secured at: 23 cm Tube secured with: Tape Dental Injury: Teeth and Oropharynx as per pre-operative assessment

## 2014-11-29 NOTE — Addendum Note (Signed)
Addendum  created 11/29/14 1508 by Edmonia Caprio, CRNA   Modules edited: Anesthesia Medication Administration

## 2014-11-29 NOTE — Progress Notes (Signed)
UR COMPLETED  

## 2014-11-29 NOTE — Interval H&P Note (Signed)
History and Physical Interval Note:  11/29/2014 7:27 AM  Earl Martin  has presented today for surgery, with the diagnosis of Abdominal aortic aneurysm I71.4  The various methods of treatment have been discussed with the patient and family. After consideration of risks, benefits and other options for treatment, the patient has consented to  Procedure(s): ABDOMINAL AORTIC ENDOVASCULAR STENT GRAFT (N/A) as a surgical intervention .  The patient's history has been reviewed, patient examined, no change in status, stable for surgery.  I have reviewed the patient's chart and labs.  Questions were answered to the patient's satisfaction.     Durene Cal

## 2014-11-29 NOTE — Op Note (Signed)
Patient name: TREVARIUS TAUL MRN: [id number] DOB: August 14, 1960 Sex: male  11/29/2014 Pre-operative Diagnosis: Abdominal aortic aneurysm Post-operative diagnosis:  Same Surgeon:  Durene Cal Co-surgeon:  JD Hart [place] Procedure:   #1: Endovascular repair of abdominal aortic aneurysm   #2: Bilateral ultrasound-guided common femoral artery cannulation   #3: Catheter in aorta 2   #4: Abdominal aortogram   #5: Distal extension 1 Anesthesia:  Gen. Blood Loss:  See anesthesia record Specimens:  None  Findings:  Complete exclusion  Devices used: Main body was primary left Gore 26 x 14 x 12.  Ipsilateral extension was Gore 18 x 9.5.  Contralateral right was a Gore 23 x 14  Indications:  The patient was found to have a pulsatile mass on physical examination.  His workup revealed a 6.3 cm infrarenal abdominal aortic aneurysm.  He comes in today for repair  Procedure:  The patient was identified in the holding area and taken to [facility] OR ROOM 12  The patient was then placed supine on the table. general anesthesia was administered.  The patient was prepped and draped in the usual sterile fashion.  A time out was called and antibiotics were administered.  Ultrasound was used to evaluate bilateral common femoral arteries.  There are widely patent and calcified.  A #11 blade was used to make a skin nick.  Bilateral common femoral arteries were cannulated under ultrasound guidance with an 18-gauge needle.  An 035 wire was advanced into the aorta under fluoroscopic visualization.  An 8 [place] dilator was used to dilate the subcutaneous tract.  Provide devices were deployed at the 11:00 and 1:00 position for pre-closure.  8 French sheaths were placed bilaterally.  The patient was fully heparinized.  Over Amplatz Super Stiff wires, a 16 French sheath was advanced up the left side into the aorta and a 14 French sheath advanced up the right side.  The main body device was prepared on the back table.  This was a Nurse, learning disability 26 x 14 x 12.  It was advanced up the left side.  A Omni flush catheter was advanced up the right.  An abdominal aortogram was performed locating the renal arteries.  The main body was then deployed down to the contralateral gate.  A second wire access was obtained through the 14 French sheath in the groin on the right.  Using a Berenstein 2 catheter and a Glidewire, the contralateral gate was cannulated.  A Omni flush catheter was then inserted and was able to be rotated freely within the main body of the device, confirming successful cannulation.  An Amplatz superstiff wire was placed.  The image detector was rotated to a left anterior oblique position and a retrograde antegrade was performed locating the right hypogastric artery.  The contralateral right limb was prepared on the back table and inserted through the sheath.  This was a Gore 23 x 14 device.  It was deployed landing at the level of the right hypogastric artery.  Next the remaining portion of the ipsilateral limb was deployed.  The image detector was rotated to a right anterior oblique position and a retrograde injury him was performed locating the left hypogastric artery.  The ipsilateral extension was prepared and inserted.  This was a Gore 18 x 9.5 device.  It was deployed landing at the level of the left hypogastric artery.  Next a Q-50 balloon was used to mold the proximal and distal attachment sites as well as device overlap.  A  completion Carlyon Prows was then performed which showed widely patent renal arteries bilaterally as well as the hypogastric artery.  There was complete exclusion of the aneurysm.  At this point the stiff wires were replaced for [facility] wires.  The sheaths were removed and the pro-glide devices were secured for arteriotomy closure.  I did have to place a third pro-glide on the right groin for hemostasis.  Once hemostasis was satisfactory, 50 mg of protamine was given.  For Vicodin and Dermabond were used on the  skin.  The patient had palpable pedal pulses at the end of the procedure.  There were no immediate complications.   Disposition:  To PACU in stable condition.   Juleen China, M.D. [facility] [place] Office: [phone] Pager:  [phone]

## 2014-11-29 NOTE — Anesthesia Postprocedure Evaluation (Signed)
  Anesthesia Post-op Note  Patient: Earl Martin  Procedure(s) Performed: Procedure(s): ABDOMINAL AORTIC ENDOVASCULAR STENT GRAFT (N/A)  Patient Location: PACU  Anesthesia Type:General  Level of Consciousness: awake, alert  and oriented  Airway and Oxygen Therapy: Patient Spontanous Breathing  Post-op Pain: none  Post-op Assessment: Post-op Vital signs reviewed              Post-op Vital Signs: Reviewed  Last Vitals:  Filed Vitals:   11/29/14 1145  BP:   Pulse: 70  Temp: 36.1 C  Resp: 19    Complications: No apparent anesthesia complications

## 2014-11-29 NOTE — H&P (View-Only) (Signed)
Patient name: Earl Martin MRN: [id number] DOB: 01/18/1961 Sex: male   Referred by: Jacinto Halim  Reason for referral:  Chief Complaint  Patient presents with  . AAA    Ref- Dr. Jacinto Halim     HISTORY OF PRESENT ILLNESS: This is a 54 year old gentleman who was recently found to have a pulsatile mass on physical examination.  This led to an abdominal ultrasound which showed a 5.7 cm infrarenal abdominal aortic aneurysm.  A CT Carlyon Prows was then ordered and he was referred to me.  The patient denies any abdominal pain or back pain.  The patient suffers from coronary artery disease.  He suffered a MI and had coronary stenting performed.  He also has atrial fibrillation.  He did not convert after cardioversion and is therefore on Xaralto.  He has hypertension which is medically managed with an ACE inhibitor.  He suffers from hypercholesterolemia which is treated with a statin.  He continues to smoke but would like to stop.  Past Medical History  Diagnosis Date  . Hypertension   . Dysrhythmia   . Myocardial infarction   . Heart murmur   . Atrial fibrillation   . Complication of anesthesia     belligerant when awakes  . Pneumonia     hx  . Sleep apnea     does not use cpap    Past Surgical History  Procedure Laterality Date  . Coronary stent placement      stents x3  . Hand nerve graft    . Rotator cuff repair    . Cardioversion N/A 09/20/2012    Procedure: CARDIOVERSION;  Surgeon: [name], MD;  Location: [facility] ENDOSCOPY;  Service: Cardiovascular;  Laterality: N/A;  . Inguinal hernia repair Left 03/07/2013    Procedure: LEFT INGUINAL HERNIA REPAIR;  Surgeon: Emelia Loron, MD;  Location: [facility] OR;  Service: General;  Laterality: Left;  . Insertion of mesh Left 03/07/2013    Procedure: INSERTION OF MESH;  Surgeon: Emelia Loron, MD;  Location: [facility] OR;  Service: General;  Laterality: Left;    History   Social History  . Marital Status: Legally Separated    Spouse  Name: N/A  . Number of Children: N/A  . Years of Education: N/A   Occupational History  . Not on file.   Social History Main Topics  . Smoking status: Current Every Day Smoker -- 0.75 packs/day for 30 years    Types: Cigarettes  . Smokeless tobacco: Not on file  . Alcohol Use: 8.4 oz/week    14 Cans of beer per week  . Drug Use: No  . Sexual Activity: Not on file   Other Topics Concern  . Not on file   Social History Narrative  . No narrative on file    No family history on file.  Allergies as of 11/26/2014  . (No Known Allergies)    Current Outpatient Prescriptions on File Prior to Visit  Medication Sig Dispense Refill  . digoxin (LANOXIN) 0.25 MG tablet Take 0.25 mg by mouth daily.    Marland Kitchen lovastatin (MEVACOR) 40 MG tablet Take 40 mg by mouth at bedtime.    . metoprolol (LOPRESSOR) 50 MG tablet Take 100 mg by mouth 2 (two) times daily.     . Multiple Vitamin (MULTIVITAMIN) capsule Take 1 capsule by mouth daily.    . Rivaroxaban (XARELTO) 20 MG TABS tablet Take 20 mg by mouth daily.    Marland Kitchen zolpidem (AMBIEN) 10 MG tablet Take  10 mg by mouth at bedtime as needed for sleep.    Marland Kitchen amiodarone (PACERONE) 200 MG tablet Take 100 mg by mouth every evening.    Marland Kitchen aspirin 81 MG tablet Take 81 mg by mouth daily. Differing doses    . Olmesartan-Amlodipine-HCTZ 40-10-25 MG TABS Take 1 tablet by mouth daily.      No current facility-administered medications on file prior to visit.     REVIEW OF SYSTEMS: Cardiovascular: No chest pain, chest pressure, palpitations, orthopnea, or dyspnea on exertion. No claudication or rest pain,  No history of DVT or phlebitis. Pulmonary: No productive cough, asthma or wheezing. Neurologic: No weakness, paresthesias, aphasia, or amaurosis. No dizziness. Hematologic: No bleeding problems or clotting disorders. Musculoskeletal: No joint pain or joint swelling. Gastrointestinal: No blood in stool or hematemesis Genitourinary: No dysuria or  hematuria. Psychiatric:: No history of major depression. Integumentary: No rashes or ulcers. Constitutional: No fever or chills.  PHYSICAL EXAMINATION:  Filed Vitals:   11/26/14 1539  BP: 124/76  Pulse: 82  Temp: 97.8 F (36.6 C)  TempSrc: Oral  Resp: 16  Height: 6' (1.829 m)  Weight: 211 lb (95.709 kg)  SpO2: 99%   Body mass index is 28.61 kg/(m^2). General: The patient appears their stated age.   HEENT:  No gross abnormalities Pulmonary: Respirations are non-labored Abdomen: Soft and non-tender.  Pulsatile mass without tenderness Musculoskeletal: There are no major deformities.   Neurologic: No focal weakness or paresthesias are detected, Skin: There are no ulcer or rashes noted. Psychiatric: The patient has normal affect. Cardiovascular: There is a regular rate and rhythm without significant murmur appreciated.  Palpable pedal pulses.  No carotid bruits  Diagnostic Studies: I have reviewed his CT scan with the following findings: 1. 6.3 cm infrarenal fusiform aortic aneurysm extending to the bifurcation. 2. Dilated common iliac arteries right 2.2 cm, left 1.8 cm.  Carotid duplex shows no significant carotid stenosis Lower extremity Doppler studies show normal triphasic waveforms and normal ABIs  Assessment:  6.3 cm abdominal aortic aneurysm Plan: The patient would like to get this fixed as soon as possible.  I have scheduled him for repair on Thursday, June 16.  We discussed the risks and benefits of the operation including the risk of lower extremity ischemia, intestinal ischemia, renal dysfunction, cardiopulmonary complications, and the need for long-term surveillance.  The patient wants to get this done.  He will stop his Lesia Hausen today.  He artery has obtained cardiac clearance from Dr.Ganji     V. Charlena Cross, M.D. [facility] [place] Office: [phone] Pager:  [phone]

## 2014-11-30 ENCOUNTER — Telehealth: Payer: Self-pay | Admitting: Surgery

## 2014-11-30 ENCOUNTER — Other Ambulatory Visit: Payer: 59

## 2014-11-30 ENCOUNTER — Encounter (HOSPITAL_COMMUNITY): Payer: Self-pay | Admitting: Surgery

## 2014-11-30 DIAGNOSIS — Z8679 Personal history of other diseases of the circulatory system: Secondary | ICD-10-CM

## 2014-11-30 DIAGNOSIS — Z48812 Encounter for surgical aftercare following surgery on the circulatory system: Secondary | ICD-10-CM

## 2014-11-30 DIAGNOSIS — Z95828 Presence of other vascular implants and grafts: Secondary | ICD-10-CM

## 2014-11-30 DIAGNOSIS — I714 Abdominal aortic aneurysm, without rupture, unspecified: Secondary | ICD-10-CM

## 2014-11-30 LAB — BASIC METABOLIC PANEL
Anion gap: 6 (ref 5–15)
BUN: 12 mg/dL (ref 6–20)
CO2: 26 mmol/L (ref 22–32)
Calcium: 8.7 mg/dL — ABNORMAL LOW (ref 8.9–10.3)
Chloride: 102 mmol/L (ref 101–111)
Creatinine, Ser: 0.62 mg/dL (ref 0.61–1.24)
GFR calc non Af Amer: >60 mL/min (ref >60)
GLUCOSE: 109 mg/dL — AB (ref 65–99)
POTASSIUM: 3.8 mmol/L (ref 3.5–5.1)
SODIUM: 134 mmol/L — AB (ref 135–145)

## 2014-11-30 LAB — CBC
HCT: 37.3 % — ABNORMAL LOW (ref 39.0–52.0)
Hemoglobin: 12.6 g/dL — ABNORMAL LOW (ref 13.0–17.0)
MCH: 31 pg (ref 26.0–34.0)
MCHC: 33.8 g/dL (ref 30.0–36.0)
MCV: 91.9 fL (ref 78.0–100.0)
PLATELETS: 190 10*3/uL (ref 150–400)
RBC: 4.06 MIL/uL — AB (ref 4.22–5.81)
RDW: 13.2 % (ref 11.5–15.5)
WBC: 8.5 10*3/uL (ref 4.0–10.5)

## 2014-11-30 MED ORDER — METOPROLOL TARTRATE 50 MG PO TABS: 100.0000 mg | ORAL_TABLET | Freq: Two times a day (BID) | ORAL | Status: DC

## 2014-11-30 MED ORDER — OXYCODONE-ACETAMINOPHEN 10-325 MG PO TABS: 1.0000 | ORAL_TABLET | ORAL | Status: DC | PRN

## 2014-11-30 NOTE — Telephone Encounter (Signed)
-----   Message from Phillips Odor, RN sent at 11/30/2014  9:34 AM EDT ----- Regarding: Joyce Gross log; also needs 4 wk f/u with CTA and appt. with [facility] See requested date below for f/u  ----- Message -----    From: Raymond Gurney, PA-C    Sent: 11/30/2014   7:46 AM      To: [facility] Charge Pool  S/p EVAR 11/29/14  F/u with Dr. Myra Gianotti in 4 weeks with CTA. Patient prefers 12/31/14 for his follow-up.  Thanks Selena Batten

## 2014-11-30 NOTE — Progress Notes (Signed)
Pt to d/c home today, VSS; Pt ambulated Reviewed d/c instructions, when to call DR, etc. Pt indicated understanding.

## 2014-11-30 NOTE — OR Nursing (Signed)
Late entry on. 11seconds of Fluoro time for case.

## 2014-11-30 NOTE — Progress Notes (Signed)
  [facility] Progress Note  Subjective  - POD #1  Some soreness in groins.   Objective Filed Vitals:   11/30/14 0500  BP: 101/66  Pulse: 70  Temp:   Resp: 25    Intake/Output Summary (Last 24 hours) at 11/30/14 0739 Last data filed at 11/30/14 0440  Gross per 24 hour  Intake   2370 ml  Output   1295 ml  Net   1075 ml    General: sitting in chair in NAD CV: irregularly irregular, normal rate Extremities: groins without hematoma, 2+ dorsalis pedis pulses bilaterally  Assessment/Planning: 54 y.o. male is s/p: endovascular repair of abdominal aortic aneurysm 1 Day Post-Op   -Doing well post-op. -Has voided.  -Needs to ambulate.  -D/c home today. F/u in 4 weeks with CTA.   Raymond Gurney 11/30/2014 7:39 AM --  Laboratory CBC    Component Value Date/Time   WBC 8.5 11/30/2014 0500   HGB 12.6* 11/30/2014 0500   HCT 37.3* 11/30/2014 0500   PLT 190 11/30/2014 0500    BMET    Component Value Date/Time   NA 134* 11/30/2014 0500   K 3.8 11/30/2014 0500   CL 102 11/30/2014 0500   CO2 26 11/30/2014 0500   GLUCOSE 109* 11/30/2014 0500   BUN 12 11/30/2014 0500   CREATININE 0.62 11/30/2014 0500   CALCIUM 8.7* 11/30/2014 0500   GFRNONAA >60 11/30/2014 0500   GFRAA >60 11/30/2014 0500    COAG Lab Results  Component Value Date   INR 1.16 11/29/2014   INR 1.04 11/29/2014   INR 0.91 03/27/2010   No results found for: PTT  Antibiotics Anti-infectives    Start     Dose/Rate Route Frequency Ordered Stop   11/29/14 2100  cefUROXime (ZINACEF) 1.5 g in dextrose 5 % 50 mL IVPB     1.5 g 100 mL/hr over 30 Minutes Intravenous Every 12 hours 11/29/14 1231 11/30/14 2059   11/29/14 0745  cefUROXime (ZINACEF) 1.5 g in dextrose 5 % 50 mL IVPB     1.5 g 100 mL/hr over 30 Minutes Intravenous To ShortStay Surgical 11/28/14 1337 11/29/14 0900       Maris Berger, PA-C [facility] Office: [phone] Pager:  [phone] 11/30/2014 7:39 AM

## 2014-11-30 NOTE — Telephone Encounter (Signed)
Spoke with pt, dpm °

## 2014-11-30 NOTE — Discharge Summary (Signed)
[facility] Discharge Summary  Earl Martin April 23, 1961 54 y.o. male  [id number]  Admission Date: 11/29/2014  Discharge Date: 11/30/2014  Physician: Durene Cal, MD  Admission Diagnosis: Abdominal aortic aneurysm I71.4  HPI:   This is a 54 y.o. male who was recently found to have a pulsatile mass on physical examination. This led to an abdominal ultrasound which showed a 5.7 cm infrarenal abdominal aortic aneurysm. A CT angiogram was then ordered and he was referred to Dr. Myra Gianotti. The patient denies any abdominal pain or back pain.  The patient suffers from coronary artery disease. He suffered a MI and had coronary stenting performed. He also has atrial fibrillation. He did not convert after cardioversion and is therefore on Xarelto. He has hypertension which is medically managed with an ACE inhibitor. He suffers from hypercholesterolemia which is treated with a statin. He continues to smoke but would like to stop.  Hospital Course:  The patient was admitted to the hospital and taken to the operating room on 11/29/2014 and underwent: Endovascular repair of abdominal aortic aneurysm  The patient tolerated the procedure well and was transported to the PACU in stable condition.   The patient was doing well post-operatively. His groin sheath sites were soft without hematoma. He denied any abdominal pain or new back pain. He had palpable dorsalis pedis pulses bilaterally. He had ambulated and voided without difficulty. He was discharged home on POD 1 in good condition. His hypertension medications were adjusted as per Dr. Verl Dicker written instructions.    CBC    Component Value Date/Time   WBC 8.5 11/30/2014 0500   RBC 4.06* 11/30/2014 0500   HGB 12.6* 11/30/2014 0500   HCT 37.3* 11/30/2014 0500   PLT 190 11/30/2014 0500   MCV 91.9 11/30/2014 0500   MCH 31.0 11/30/2014 0500   MCHC 33.8 11/30/2014 0500   RDW 13.2 11/30/2014 0500   LYMPHSABS 2.0  03/06/2013 0941   MONOABS 0.8 03/06/2013 0941   EOSABS 0.3 03/06/2013 0941   BASOSABS 0.1 03/06/2013 0941    BMET    Component Value Date/Time   NA 134* 11/30/2014 0500   K 3.8 11/30/2014 0500   CL 102 11/30/2014 0500   CO2 26 11/30/2014 0500   GLUCOSE 109* 11/30/2014 0500   BUN 12 11/30/2014 0500   CREATININE 0.62 11/30/2014 0500   CALCIUM 8.7* 11/30/2014 0500   GFRNONAA >60 11/30/2014 0500   GFRAA >60 11/30/2014 0500     Discharge Instructions:   The patient is discharged to home with extensive instructions on wound care and progressive ambulation.  They are instructed not to drive or perform any heavy lifting until returning to see the physician in his office.  Discharge Instructions    ABDOMINAL PROCEDURE/ANEURYSM REPAIR/AORTO-BIFEMORAL BYPASS:  Call MD for increased abdominal pain; cramping diarrhea; nausea/vomiting    Complete by:  As directed      Call MD for:  redness, tenderness, or signs of infection (pain, swelling, bleeding, redness, odor or green/yellow discharge around incision site)    Complete by:  As directed      Call MD for:  severe or increased pain, loss or decreased feeling  in affected limb(s)    Complete by:  As directed      Call MD for:  temperature >100.5    Complete by:  As directed      Discharge wound care:    Complete by:  As directed   May take off dressings tomorrow. Then wash  wounds daily with soap and water and pat dry. You do not have to apply any new dressings.     Driving Restrictions    Complete by:  As directed   No driving for 1 week     Increase activity slowly    Complete by:  As directed   Walk with assistance use walker or cane as needed     Lifting restrictions    Complete by:  As directed   No lifting greater than gallon of milk for 4 weeks     Resume previous diet    Complete by:  As directed            Discharge Diagnosis:  Abdominal aortic aneurysm I71.4  Secondary Diagnosis: Patient Active Problem List    Diagnosis Date Noted  . AAA (abdominal aortic aneurysm) 11/29/2014  . DYSLIPIDEMIA 11/30/2008  . OBESITY 11/30/2008  . TOBACCO ABUSE 11/30/2008  . HYPERTENSION 11/30/2008  . CAD 11/30/2008  . HYPERGLYCEMIA 11/30/2008   Past Medical History  Diagnosis Date  . Hypertension   . Dysrhythmia   . Myocardial infarction   . Heart murmur   . Atrial fibrillation   . Complication of anesthesia     belligerant when awakes  . Pneumonia     hx  . Sleep apnea     does not use cpap  . Coronary artery disease   . Hypercholesterolemia   . Headache        Medication List    STOP taking these medications        amiodarone 200 MG tablet  Commonly known as:  PACERONE      TAKE these medications        digoxin 0.25 MG tablet  Commonly known as:  LANOXIN  Take 0.25 mg by mouth daily.     FISH OIL PO  Take 1 capsule by mouth daily.     hydrochlorothiazide 25 MG tablet  Commonly known as:  HYDRODIURIL  Take 25 mg by mouth daily as needed (fluid).     lisinopril 20 MG tablet  Commonly known as:  PRINIVIL,ZESTRIL  Take 20 mg by mouth daily.     lovastatin 40 MG tablet  Commonly known as:  MEVACOR  Take 40 mg by mouth at bedtime.     metoprolol 50 MG tablet  Commonly known as:  LOPRESSOR  Take 2 tablets (100 mg total) by mouth 2 (two) times daily.     multivitamin capsule  Take 1 capsule by mouth daily.     nitroGLYCERIN 0.4 MG SL tablet  Commonly known as:  NITROSTAT  Place 0.4 mg under the tongue every 5 (five) minutes as needed for chest pain.     oxyCODONE-acetaminophen 10-325 MG per tablet  Commonly known as:  PERCOCET  Take 1 tablet by mouth every 4 (four) hours as needed for pain.     vitamin C 1000 MG tablet  Take 1,000 mg by mouth daily.     XARELTO 20 MG Tabs tablet  Generic drug:  rivaroxaban  Take 20 mg by mouth daily.     zolpidem 10 MG tablet  Commonly known as:  AMBIEN  Take 10 mg by mouth at bedtime as needed for sleep.        Percocet #30 No  Refill  Disposition: Home  Patient's condition: is Good  Follow up: 1. Dr. Myra Gianotti in 4 weeks with CTA   Maris Berger, PA-C [facility] [phone] 11/30/2014  12:36 PM   -  For [facility] Registry use --- Instructions: Press F2 to tab through selections.  Delete question if not applicable.   Post-op:  Time to Extubation: [x ] In OR,  < 12 hrs,  12-24 hrs,  >=24 hrs Vasopressors Req. Post-op: No MI: No.,  Troponin only,  EKG or Clinical New Arrhythmia: No CHF: No ICU Stay: 0 days Transfusion: No    Complications: Resp failure: No.,  Pneumonia,  Ventilator Chg in renal function: No.,  Inc. Cr > 0.5,  Temp. Dialysis,  Permanent dialysis Leg ischemia: No., no Surgery needed,  Yes, Surgery needed,  Amputation Bowel ischemia: No.,  Medical Rx,  Surgical Rx Wound complication: No.,  Superficial separation/infection,  Return to OR Return to OR: No  Return to OR for bleeding: No  Stroke: No.,  Minor,  Major  Discharge medications: Statin use:  Yes If No:  For Medical reasons,  Non-compliant,  Not-indicated ASA use:  No  If No: [ x] For Medical reasons, patient is on xarelto and has been taken off ASA by cardiologist,  Non-compliant,  Not-indicated Plavix use:  No If No:  For Medical reasons,  Non-compliant,  Not-indicated Beta blocker use:  Yes If No:  For Medical reasons,  Non-compliant,  Not-indicated

## 2014-12-03 ENCOUNTER — Encounter: Payer: No Typology Code available for payment source | Admitting: Surgery

## 2014-12-03 ENCOUNTER — Encounter: Payer: Self-pay | Admitting: Cardiology

## 2014-12-27 ENCOUNTER — Encounter: Payer: Self-pay | Admitting: Surgery

## 2014-12-31 ENCOUNTER — Encounter: Payer: Self-pay | Admitting: Surgery

## 2014-12-31 ENCOUNTER — Ambulatory Visit (INDEPENDENT_AMBULATORY_CARE_PROVIDER_SITE_OTHER): Payer: Self-pay | Admitting: Surgery

## 2014-12-31 ENCOUNTER — Encounter: Payer: 59 | Admitting: Surgery

## 2014-12-31 ENCOUNTER — Ambulatory Visit
Admission: RE | Admit: 2014-12-31 | Discharge: 2014-12-31 | Disposition: A | Discharge status: Home / Self Care | Payer: 59 | Source: Ambulatory Visit | Attending: Surgery | Admitting: Surgery

## 2014-12-31 VITALS — BP 110/71 | HR 64 | Ht 72.0 in | Wt 220.6 lb

## 2014-12-31 DIAGNOSIS — Z48812 Encounter for surgical aftercare following surgery on the circulatory system: Secondary | ICD-10-CM

## 2014-12-31 DIAGNOSIS — Z95828 Presence of other vascular implants and grafts: Secondary | ICD-10-CM

## 2014-12-31 DIAGNOSIS — I714 Abdominal aortic aneurysm, without rupture, unspecified: Secondary | ICD-10-CM

## 2014-12-31 MED ORDER — IOPAMIDOL (ISOVUE-370) INJECTION 76%
75.0000 mL | Freq: Once | INTRAVENOUS | Status: AC | PRN
Start: 2014-12-31 — End: 2014-12-31
  Administered 2014-12-31: 75 mL via INTRAVENOUS

## 2014-12-31 NOTE — Addendum Note (Signed)
Addended by: Adria DillELDRIDGE-LEWIS, Deja Pisarski L on: 12/31/2014 02:34 PM   Modules accepted: Orders

## 2014-12-31 NOTE — Progress Notes (Signed)
Patient name: Earl EvenerWilliam E Martin MRN: [id number] DOB: 12/04/1960 Sex: male     Chief Complaint  Patient presents with  . Re-evaluation    4 wk f/u s/p abdominal aortic stent    HISTORY OF PRESENT ILLNESS:  the patient is here today for follow-up. On 11/29/2014, he underwent endovascular repair of an abdominal aortic aneurysm with maximum diameter 5.9 cm.  His postoperative course was uncomplicated.  He is back today without complaints.  Past Medical History  Diagnosis Date  . Hypertension   . Dysrhythmia   . Myocardial infarction   . Heart murmur   . Atrial fibrillation   . Complication of anesthesia     belligerant when awakes  . Pneumonia     hx  . Sleep apnea     does not use cpap  . Coronary artery disease   . Hypercholesterolemia   . Headache     Past Surgical History  Procedure Laterality Date  . Coronary stent placement      stents x3  . Hand nerve graft    . Rotator cuff repair    . Cardioversion N/A 09/20/2012    Procedure: CARDIOVERSION;  Surgeon: Pamella PertJagadeesh R Ganji, MD;  [facility];  Service: Cardiovascular;  Laterality: N/A;  . Inguinal hernia repair Left 03/07/2013    Procedure: LEFT INGUINAL HERNIA REPAIR;  Surgeon: Emelia LoronMatthew Wakefield, MD;  [facility];  Service: General;  Laterality: Left;  . Insertion of mesh Left 03/07/2013    Procedure: INSERTION OF MESH;  Surgeon: Emelia LoronMatthew Wakefield, MD;  [facility];  Service: General;  Laterality: Left;  . Cardiac catheterization    . Abdominal aortic endovascular stent graft N/A 11/29/2014    Procedure: ABDOMINAL AORTIC ENDOVASCULAR STENT GRAFT;  Surgeon: Nada LibmanVance W Brabham, MD;  [facility];  Service: Vascular;  Laterality: N/A;    History   Social History  . Marital Status: Legally Separated    Spouse Name: N/A  . Number of Children: N/A  . Years of Education: N/A   Occupational History  . Not on file.   Social History Main Topics  . Smoking status: Current Every Day Smoker -- 0.50  packs/day for 30 years    Types: Cigarettes  . Smokeless tobacco: Never Used  . Alcohol Use: 8.4 oz/week    14 Cans of beer per week     Comment:  " 2-3 beers daily "  . Drug Use: No  . Sexual Activity: Not on file   Other Topics Concern  . Not on file   Social History Narrative    Family History  Problem Relation Age of Onset  . Heart failure Father   . Cancer Father   . Cancer Sister   . Diabetes Sister     Allergies as of 12/31/2014  . (No Known Allergies)    Current Outpatient Prescriptions on File Prior to Visit  Medication Sig Dispense Refill  . Ascorbic Acid (VITAMIN C) 1000 MG tablet Take 1,000 mg by mouth daily.    . digoxin (LANOXIN) 0.25 MG tablet Take 0.25 mg by mouth daily.    . hydrochlorothiazide (HYDRODIURIL) 25 MG tablet Take 25 mg by mouth daily as needed (fluid).     Marland Kitchen. lisinopril (PRINIVIL,ZESTRIL) 20 MG tablet Take 20 mg by mouth daily.    Marland Kitchen. lovastatin (MEVACOR) 40 MG tablet Take 40 mg by mouth at bedtime.    . metoprolol (LOPRESSOR) 50 MG tablet Take 2 tablets (100 mg total) by  mouth 2 (two) times daily. 30 tablet 3  . Multiple Vitamin (MULTIVITAMIN) capsule Take 1 capsule by mouth daily.    . nitroGLYCERIN (NITROSTAT) 0.4 MG SL tablet Place 0.4 mg under the tongue every 5 (five) minutes as needed for chest pain.    . Omega-3 Fatty Acids (FISH OIL PO) Take 1 capsule by mouth daily.    Marland Kitchen oxyCODONE-acetaminophen (PERCOCET) 10-325 MG per tablet Take 1 tablet by mouth every 4 (four) hours as needed for pain. 30 tablet 0  . Rivaroxaban (XARELTO) 20 MG TABS tablet Take 20 mg by mouth daily.    Marland Kitchen zolpidem (AMBIEN) 10 MG tablet Take 10 mg by mouth at bedtime as needed for sleep.     No current facility-administered medications on file prior to visit.     REVIEW OF SYSTEMS: Cardiovascular: No chest pain, chest pressure, palpitations, orthopnea, or dyspnea on exertion. No claudication or rest pain,  No history of DVT or phlebitis. Pulmonary: No productive  cough, asthma or wheezing. Neurologic: No weakness, paresthesias, aphasia, or amaurosis. No dizziness. Hematologic: No bleeding problems or clotting disorders. Musculoskeletal: No joint pain or joint swelling. Gastrointestinal: No blood in stool or hematemesis Genitourinary: No dysuria or hematuria. Psychiatric:: No history of major depression. Integumentary: No rashes or ulcers. Constitutional: No fever or chills.  PHYSICAL EXAMINATION:   Vital signs are  Filed Vitals:   12/31/14 1152  BP: 110/71  Pulse: 64  Height: 6' (1.829 m)  Weight: 220 lb 9.6 oz (100.064 kg)  SpO2: 99%   Body mass index is 29.91 kg/(m^2). General: The patient appears their stated age. HEENT:  No gross abnormalities Pulmonary:  Non labored breathing Abdomen:  Groin incisions have completely healed Musculoskeletal: There are no major deformities. Neurologic: No focal weakness or paresthesias are detected, Skin: There are no ulcer or rashes noted. Psychiatric: The patient has normal affect. Cardiovascular:  Palpable pedal pulses  Diagnostic Studies  I have reviewed his CT scan with the following findings: 1. Post endovascular repair of infrarenal abdominal aortic aneurysm. 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 This finding is without associated interval increase in size of the native abdominal aortic aneurysm sac, again measuring approximately 5.9 cm in maximal diameter.  Assessment:  Status post abdominal aortic aneurysm repair Plan:  the patient has recovered nicely from his procedure.  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 I discussed significance of a endoleak with the patient.  I have recommended follow-up evaluation in 6 months with a repeat CT angiogram.  The patient will be getting his carotid and lower extremity Dopplers performed by Dr. Maryelizabeth Rowan. Charlena Cross, M.D. [facility] [place] Office: [phone] Pager:  [phone]

## 2015-06-28 ENCOUNTER — Encounter: Payer: Self-pay | Admitting: Surgery

## 2015-07-08 ENCOUNTER — Encounter: Payer: Self-pay | Admitting: Surgery

## 2015-07-08 ENCOUNTER — Ambulatory Visit (INDEPENDENT_AMBULATORY_CARE_PROVIDER_SITE_OTHER): Payer: BLUE CROSS/BLUE SHIELD | Admitting: Surgery

## 2015-07-08 ENCOUNTER — Ambulatory Visit
Admission: RE | Admit: 2015-07-08 | Discharge: 2015-07-08 | Disposition: A | Discharge status: Home / Self Care | Payer: BLUE CROSS/BLUE SHIELD | Source: Ambulatory Visit | Attending: Surgery | Admitting: Surgery

## 2015-07-08 VITALS — BP 117/79 | HR 69 | Ht 72.0 in | Wt 196.2 lb

## 2015-07-08 DIAGNOSIS — I714 Abdominal aortic aneurysm, without rupture, unspecified: Secondary | ICD-10-CM

## 2015-07-08 DIAGNOSIS — Z48812 Encounter for surgical aftercare following surgery on the circulatory system: Secondary | ICD-10-CM

## 2015-07-08 MED ORDER — IOPAMIDOL (ISOVUE-370) INJECTION 76%
100.0000 mL | Freq: Once | INTRAVENOUS | Status: AC | PRN
  Administered 2015-07-08: 100 mL via INTRAVENOUS

## 2015-07-08 NOTE — Progress Notes (Signed)
[facility] [place]  Patient name: Earl Martin MRN: [id number] DOB: 09-May-1961 Sex: male  REASON FOR VISIT: follow-up  HPI: Earl Martin is a 55 y.o. male who presents for follow-up s/p endovascular stent graft repair of his abdominal aortic aneurysm on 11/29/2014. At his last office visit on 12/31/2014, he had a type II endoleak.   The patient denies any change in his medical history since his last office visit. He denies any abdominal or back pain. He had lower extremity and carotid dopplers performed pre-op in June 2016 that were unremarkable.    He continues to smoke, but has reduced his smoking amount. He wants to quit smoking. He has hypertension managed on a beta blocker, ACEI and thiazide diuretic. He is on Xarelto for atrial fibrillation. The patient has now moved to [place].  Past Medical History  Diagnosis Date  . Hypertension   . Dysrhythmia   . Myocardial infarction (HCC)   . Heart murmur   . Atrial fibrillation (HCC)   . Complication of anesthesia     belligerant when awakes  . Pneumonia     hx  . Sleep apnea     does not use cpap  . Coronary artery disease   . Hypercholesterolemia   . Headache     Family History  Problem Relation Age of Onset  . Heart failure Father   . Cancer Father   . Cancer Sister   . Diabetes Sister     SOCIAL HISTORY: Social History  Substance Use Topics  . Smoking status: Current Every Day Smoker -- 0.50 packs/day for 30 years    Types: Cigarettes  . Smokeless tobacco: Never Used  . Alcohol Use: 8.4 oz/week    14 Cans of beer per week     Comment:  " 2-3 beers daily "    No Known Allergies  Current Outpatient Prescriptions  Medication Sig Dispense Refill  . Ascorbic Acid (VITAMIN C) 1000 MG tablet Take 1,000 mg by mouth daily.    . digoxin (LANOXIN) 0.25 MG tablet Take 0.25 mg by mouth daily.    . hydrochlorothiazide (HYDRODIURIL) 25 MG tablet Take 25 mg by mouth daily as needed (fluid).      Marland Kitchen lisinopril (PRINIVIL,ZESTRIL) 20 MG tablet Take 20 mg by mouth daily.    Marland Kitchen lovastatin (MEVACOR) 40 MG tablet Take 40 mg by mouth at bedtime.    . metoprolol (LOPRESSOR) 50 MG tablet Take 2 tablets (100 mg total) by mouth 2 (two) times daily. 30 tablet 3  . Multiple Vitamin (MULTIVITAMIN) capsule Take 1 capsule by mouth daily.    . nitroGLYCERIN (NITROSTAT) 0.4 MG SL tablet Place 0.4 mg under the tongue every 5 (five) minutes as needed for chest pain.    . Omega-3 Fatty Acids (FISH OIL PO) Take 1 capsule by mouth daily.    Marland Kitchen oxyCODONE-acetaminophen (PERCOCET) 10-325 MG per tablet Take 1 tablet by mouth every 4 (four) hours as needed for pain. 30 tablet 0  . Rivaroxaban (XARELTO) 20 MG TABS tablet Take 20 mg by mouth daily.    Marland Kitchen zolpidem (AMBIEN) 10 MG tablet Take 10 mg by mouth at bedtime as needed for sleep.     No current facility-administered medications for this visit.    REVIEW OF SYSTEMS:   denotes positive finding,  denotes negative finding Cardiac  Comments:  Chest pain or chest pressure:    Shortness of breath upon exertion:    Short of breath  when lying flat:    Irregular heart rhythm:        Vascular    Pain in calf, thigh, or hip brought on by ambulation:    Pain in feet at night that wakes you up from your sleep:     Blood clot in your veins:    Leg swelling:         Pulmonary    Oxygen at home:    Productive cough:     Wheezing:         Neurologic    Sudden weakness in arms or legs:     Sudden numbness in arms or legs:     Sudden onset of difficulty speaking or slurred speech:    Temporary loss of vision in one eye:     Problems with dizziness:         Gastrointestinal    Blood in stool:     Vomited blood:         Genitourinary    Burning when urinating:     Blood in urine:        Psychiatric    Major depression:         Hematologic    Bleeding problems:    Problems with blood clotting too easily:        Skin    Rashes or ulcers:          Constitutional    Fever or chills:      PHYSICAL EXAM: Filed Vitals:   07/08/15 1347  BP: 117/79  Pulse: 69  Height: 6' (1.829 m)  Weight: 196 lb 3.2 oz (88.996 kg)  SpO2: 98%    GENERAL: The patient is a well-nourished male, in no acute distress. The vital signs are documented above. CARDIAC: Irregularly irregular  VASCULAR: 2+ femoral pulses, 3+ dorsalis pedis pulses b/l, 2+ posterior tibial pulses b/l PULMONARY: There is good air exchange bilaterally without wheezing or rales. ABDOMEN: Soft and non-tender  MUSCULOSKELETAL: There are no major deformities or cyanosis. NEUROLOGIC: No focal weakness or paresthesias are detected. SKIN: There are no ulcers or rashes noted. PSYCHIATRIC: The patient has a normal affect.  DATA:  CTA abd/pelvis 07/08/15  No evidence of endoleak. Slight decrease in aneurysm sac to 6 cm. 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   MEDICAL ISSUES:  S/p EVAR   The patient is doing well. There is no evidence of endoleak on today's CTA. He previously had a type II endoleak. His native aneurysm sac has decreased in size. He was counseled on smoking cessation. He will follow-up in six months with an [facility] duplex.   Maris Berger, PA-C [facility] [place]    I agree with the above. The patient is back for follow-up of his endovascular aneurysm repair.  He had a type II endoleak with an increase in sac size at his last visit.  Repeat CT scan today shows that there  is no evidence of endoleak and there has been a decrease in the size of his aneurysm sac, now measuring 6.0 down from 6.3. I have scheduled the patient for return visit in 6 months with a follow-up duplex  Durene Cal

## 2016-01-02 ENCOUNTER — Encounter: Payer: Self-pay | Admitting: Surgery

## 2016-01-13 ENCOUNTER — Ambulatory Visit (INDEPENDENT_AMBULATORY_CARE_PROVIDER_SITE_OTHER): Payer: BLUE CROSS/BLUE SHIELD | Admitting: Surgery

## 2016-01-13 ENCOUNTER — Encounter: Payer: Self-pay | Admitting: Surgery

## 2016-01-13 ENCOUNTER — Ambulatory Visit (HOSPITAL_COMMUNITY)
Admission: RE | Admit: 2016-01-13 | Discharge: 2016-01-13 | Disposition: A | Discharge status: Home / Self Care | Payer: BLUE CROSS/BLUE SHIELD | Source: Ambulatory Visit | Attending: Surgery | Admitting: Surgery

## 2016-01-13 VITALS — BP 119/79 | HR 87 | Temp 97.5°F | Resp 16 | Ht 72.0 in | Wt 176.0 lb

## 2016-01-13 DIAGNOSIS — I714 Abdominal aortic aneurysm, without rupture, unspecified: Secondary | ICD-10-CM

## 2016-01-13 DIAGNOSIS — G4730 Sleep apnea, unspecified: Secondary | ICD-10-CM | POA: Diagnosis not present

## 2016-01-13 DIAGNOSIS — I10 Essential (primary) hypertension: Secondary | ICD-10-CM | POA: Diagnosis not present

## 2016-01-13 DIAGNOSIS — Z48812 Encounter for surgical aftercare following surgery on the circulatory system: Secondary | ICD-10-CM | POA: Insufficient documentation

## 2016-01-13 DIAGNOSIS — I2510 Atherosclerotic heart disease of native coronary artery without angina pectoris: Secondary | ICD-10-CM | POA: Diagnosis not present

## 2016-01-13 DIAGNOSIS — E7800 Pure hypercholesterolemia, unspecified: Secondary | ICD-10-CM | POA: Diagnosis not present

## 2016-01-13 NOTE — Progress Notes (Signed)
[facility] [place]  Patient name: Earl Martin MRN: [id number] DOB: 1960-11-14 Sex: male  REASON FOR VISIT: Follow-up  HPI: Earl Martin is a 55 y.o. male who is status post endovascular repair of a 6.3 cm infrarenal abdominal aortic aneurysm on 11/29/2014.  At his initial postoperative visit, he had a type II endoleak, however at his 6 month follow-up, this had resolved and the aneurysm sac had decreased down to 6.0 cm.  He denies any change in his medical history since his last office visit.  He has no abdominal pain or back pain.  Lower extremity and carotid Doppler studies in June 2016 were unremarkable.  He is on Xarelto for atrial fibrillation.  He is medically managed for hypertension.  He continues to live in [place].  Past Medical History:  Diagnosis Date  . Atrial fibrillation (HCC)   . Complication of anesthesia    belligerant when awakes  . Coronary artery disease   . Dysrhythmia   . Headache   . Heart murmur   . Hypercholesterolemia   . Hypertension   . Myocardial infarction (HCC)   . Pneumonia    hx  . Sleep apnea    does not use cpap    Family History  Problem Relation Age of Onset  . Heart failure Father   . Cancer Father   . Cancer Sister   . Diabetes Sister     SOCIAL HISTORY: Social History  Substance Use Topics  . Smoking status: Current Every Day Smoker    Packs/day: 0.50    Years: 30.00    Types: Cigarettes  . Smokeless tobacco: Never Used  . Alcohol use 8.4 oz/week    14 Cans of beer per week     Comment:  " 2-3 beers daily "    No Known Allergies  Current Outpatient Prescriptions  Medication Sig Dispense Refill  . Ascorbic Acid (VITAMIN C) 1000 MG tablet Take 1,000 mg by mouth daily.    . digoxin (LANOXIN) 0.25 MG tablet Take 0.25 mg by mouth daily.    . hydrochlorothiazide (HYDRODIURIL) 25 MG tablet Take 25 mg by mouth daily as needed (fluid).     Marland Kitchen lisinopril  (PRINIVIL,ZESTRIL) 20 MG tablet Take 20 mg by mouth daily.    Marland Kitchen lovastatin (MEVACOR) 40 MG tablet Take 40 mg by mouth at bedtime.    . metoprolol (LOPRESSOR) 50 MG tablet Take 2 tablets (100 mg total) by mouth 2 (two) times daily. 30 tablet 3  . Multiple Vitamin (MULTIVITAMIN) capsule Take 1 capsule by mouth daily.    . nitroGLYCERIN (NITROSTAT) 0.4 MG SL tablet Place 0.4 mg under the tongue every 5 (five) minutes as needed for chest pain.    . Omega-3 Fatty Acids (FISH OIL PO) Take 1 capsule by mouth daily.    Marland Kitchen oxyCODONE-acetaminophen (PERCOCET) 10-325 MG per tablet Take 1 tablet by mouth every 4 (four) hours as needed for pain. 30 tablet 0  . Rivaroxaban (XARELTO) 20 MG TABS tablet Take 20 mg by mouth daily.    Marland Kitchen zolpidem (AMBIEN) 10 MG tablet Take 10 mg by mouth at bedtime as needed for sleep.     No current facility-administered medications for this visit.     REVIEW OF SYSTEMS:  [X]  denotes positive finding, [ ]  denotes negative finding Cardiac  Comments:  Chest pain or chest pressure:    Shortness of breath upon exertion:    Short of breath when lying flat:  Irregular heart rhythm:        Vascular    Pain in calf, thigh, or hip brought on by ambulation:    Pain in feet at night that wakes you up from your sleep:     Blood clot in your veins:    Leg swelling:         Pulmonary    Oxygen at home:    Productive cough:     Wheezing:         Neurologic    Sudden weakness in arms or legs:     Sudden numbness in arms or legs:     Sudden onset of difficulty speaking or slurred speech:    Temporary loss of vision in one eye:     Problems with dizziness:         Gastrointestinal    Blood in stool:     Vomited blood:         Genitourinary    Burning when urinating:     Blood in urine:        Psychiatric    Major depression:         Hematologic    Bleeding problems:    Problems with blood clotting too easily:        Skin    Rashes or ulcers:        Constitutional     Fever or chills:      PHYSICAL EXAM: Vitals:   01/13/16 0912  BP: 119/79  Pulse: 87  Resp: 16  Temp: 97.5 F (36.4 C)  TempSrc: Oral  SpO2: 100%  Weight: 176 lb (79.8 kg)  Height: 6' (1.829 m)    GENERAL: The patient is a well-nourished male, in no acute distress. The vital signs are documented above. CARDIAC: There is a regular rate and rhythm.  VASCULAR: No carotid bruits PULMONARY: There is good air exchange bilaterally without wheezing or rales. ABDOMEN: Soft and non-tender with normal pitched bowel sounds.  MUSCULOSKELETAL: There are no major deformities or cyanosis. NEUROLOGIC: No focal weakness or paresthesias are detected. SKIN: There are no ulcers or rashes noted. PSYCHIATRIC: The patient has a normal affect.  DATA:  Ultrasound was performed today which shows a infrarenal aneurysm with maximum diameter of 5.1 cm.  MEDICAL ISSUES: Abdominal aortic aneurysm: The patient continues to do very well.  His aneurysm has decreased now down to 5.1 cm.  I will continue with annual ultrasound surveillance.    Durene Cal, MD [facility] [phone] Pager [phone]

## 2016-02-02 DIAGNOSIS — I340 Nonrheumatic mitral (valve) insufficiency: Secondary | ICD-10-CM | POA: Diagnosis present

## 2016-02-02 NOTE — H&P (Signed)
OFFICE VISIT NOTES COPIED TO EPIC FOR DOCUMENTATION  . History of Present Illness Laverda Page MD; 11/30/2015 12:36 PM) Patient words: 6 month f/u for htn and afib; last office visit 05/24/2015.  The patient is a 55 year old male who presents for a Follow-up for Coronary artery disease. He is fairly active, he is lost about 150 pounds over the past 3 years intentionally. He has history of known coronary artery disease, hypertension, hyperlipidemia, chronic atrial fibrillation and chronic back pain due to spinal stenosis, known abdominal aortic aneurysm status post repair on 11/29/2014 by placement of stent graft by Dr. Annamarie Major. This is his six-month office visit.  He has history of known coronary artery disease and inferior wall myocardial infarction on 12/23/2010 and has a mid RCA non-DES stent and staged LAD DES stent placed on 12/26/2010. Last echocardiogram 03/24/2012 revealing mildly decreased LV systolic function at 19%, dilated cardiomyopathy with dilated right ventricle. Poor echo window. Last nuclear stress test was on 11/19/2014 revealing dilated left ventricle, inferior and inferoapical wall scar versus soft tissue attenuation, preserved EF at 58%. He is presently asymptomatic. He is tolerating anticoagulation without any bleeding complications.  Additional reasons for visit:  Follow-up for Atrial fibrillation    Problem List/Past Medical (April Garrison; 11/29/2015 9:02 AM) Chronic atrial fibrillation (I48.2)  Failed Caridoversion 09/20/2012 CAD S/P percutaneous coronary angioplasty (I25.10)  Acute inferior MI on 12/23/2010 S/P mid RCA non drug eluting stent implantation, staged proximal LAD DES stent 12/26/2010. Exercise sestamibi stress test 11/19/2014: 1. The resting electrocardiogram demonstrated atrial fibrillation, normal resting conduction and nonspecific ST-T changes. Stress EKG is non-diagnostic for ischemia as it a pharmacologic stress using Lexiscan. Stress symptoms  included dyspnea. 2. The perfusion imaging study demonstrates the left ventricle to be dilated both and rest and stress images at 218 mL. Raw images reveal mild diaphragmatic attenuation. Perfusion imaging study demonstrates inferior wall decreased uptake, extending from the base to the apex including apical anterior and apical wall. There is no statistically significant reversibility. This represents a soft tissue Echo 03/24/12: Patient appeared to be in A. Fibrillation. Poor echo window, wall motion abnormalities cannot be excluded. Left ventricular internal dimension is mildly dilated. Dilated cardiomyopathy. Mild concentric hypertrophy. Mild global hypokinesis. Mildly decreased systolic global function. Calculated EF 46%. Left atrial cavity is severely dilated. Right atrial cavity is severely dilated. Right ventricular cavity is mild to moderately enlarged. Normal global wall motion. Mild mitral valve leaflet thickening. Mild to moderate posteriorly directed mitral regurgitation. I cannot completely exclude presence of MVP. Insignificant pericardial effusion. Essential hypertension (I10)  Labs 11/22/2015: Potassium 4.5, serum glucose 67 mg, even 2, serum creatinine 0.86, eGFR 89 mL. Total cholesterol 123, triglycerides 157, HDL 44, LDL 63. Digoxin 0.9. 05/24/2015: Creatinine 0.70, potassium 4.5, sodium 133, CMP otherwise normal, CBC normal, digoxin 0.7 Hyperlipidemia, group A (E78.00)  Obstructive sleep apnea, adult (G47.33)  Severe sleep apnea by sleep study 08/20/09. Patient not on CPAP. Unable to tolerate. Feels claustrophobic. Tobacco abuse (Z72.0)  Mixed hyperlipidemia (E78.2)  Insomnia, unspecified type (G47.00)  Malaise and fatigue (R53.81, R53.83)  Preop examination (Z01.818)  Left inguinal hernia repair Exacerbation of chronic back pain (M54.9)  Low back pain at multiple sites (M54.5)  S/P abdominal aortic aneurysm repair (F79.024) 11/29/2014 AAA, underwent endovascular repair on  11/29/2014 by placement of stent graft by Dr. Annamarie Major. PAD (peripheral artery disease) (I73.9)  AAA, underwent endovascular repair on 11/29/2014 by placement of stent graft by Dr. Annamarie Major. Screening for poopliteal A.  aneurysm with Lower extremity arterial duplex 11/20/2014: No hemodynamically significant stenoses are identified in the bilateral lower extremity arterial system.This exam reveals normal perfusion of both the lower extremities with right ABI 0.97 and left ABI 1.07 with triphasic waveforms at foot. Severe diffuse mixed plaque noted bilateral.  Allergies (April Garrison; 11/29/2015 9:01 AM) No Known Drug Allergies 04/14/2012  Family History (April Garrison; 11/29/2015 9:07 AM) Mother  living-healthy. Father  Deceased. at age 51-unknown reason. Sister 2  1-Deceased from Cancer; 1-Living, older  Social History (April Garrison; 11/29/2015 9:07 AM) Alcohol Use  Drinks beer. 2 daily varies daily Marital status  Divorced. Number of Children  2.  Past Surgical History (April Garrison; 11/29/2015 9:01 AM) Cardioversion external [phone])  Outpatient, Date: 09/20/2012, No post-op complications. Rotator Cuff Repair - Right 2011 Hernia Repair 03/2013 Abdominal Aortic Aneurysm Repair 11/29/2014 ABDOMINAL AORTIC ENDOVASCULAR STENT GRAFT-Gore -C3-aorto by common iliac (Dr. Annamarie Major)  Medication History (April Louretta Shorten; 11/29/2015 9:09 AM) Percocet (10-325MG Tablet, 1 (one) Tablet Oral two times daily, as needed, Taken starting 08/26/2015) Active. Digoxin (250MCG Tablet, 1 (one) Tablet Oral daily, Taken starting 07/04/2015) Active. Xarelto (20MG Tablet, 1 Tablet Oral daily in the evening after dinner, Taken starting 07/04/2015) Active. (PA approval on 12/29/12.) Metoprolol Tartrate (100MG Tablet, 1 (one) Tablet Tablet Oral two times daily, Taken starting 11/16/2014) Active. Nitrostat (0.4MG Tab Sublingual, 1 (one) Tab Sublingual Tab Sub Sublingual every 5 minutes as  needed for chest pain., Taken starting 05/14/2014) Active. Zolpidem Tartrate (10MG Tablet, 1 (one) Tablet Tablet Oral at bedtime if needed for sleep, Taken starting 10/05/2012) Active. (faxed in refill 03/09/2013 with 2 refills.) Lovastatin (40MG Tablet, 1 Oral daily) Active. Multiple Vitamin (1 (one) Oral daily) Active. Lisinopril (20MG Tablet, 1 Oral daily) Active. Vitamin C (1000MG Tablet, 1 Oral daily) Active. Omega 3 (1000MG Capsule, 1 Oral daily) Active. Medications Reconciled (verbally with pt)  Diagnostic Studies History Laverda Page, MD; 11/29/2015 9:53 AM) Cardiovascular Stress Test 2011 Nuclear Sleep Study 2011 positive for sleep apnea (does not use CPAP) Echocardiogram 03/27/2012 1. Patient appeared to be in A. Fibrillation. Poor echo window, wall motion abnormalities cannot be excluded. Left ventricular internal dimension is mildly dilated. Dilated cardiomyopathy. Mild concentric hypertrophy. Mild global hypokinesis. Mildly decreased systolic global function. Calculated EF 46%. 2. Left atrial cavity is severely dilated. Right atrial cavity is severely dilated.3. Right ventricular cavity is mild to moderately enlarged. Normal global wall motion. 4. Mild mitral valve leaflet thickening. Mild to moderate posteriorly directed mitral regurgitation. I cannot completely exclude presence of MVP. 5. Insignificant pericardial effusion. No evidence of pleural effusion. Pericardial fluid is clear. Nuclear stress test 06/03/2012 Lexiscan stress: 1. Resting EKG A. Fibrillation, Poor R wave progression. Inferior and lateral ST depression, cannot R/O ischemia. Stress EKG was non diagnostic for ischemia. No additional ST-T changes of ischemia noted with pharmacologic stress testing. Stress symptoms included SOB AND LIGHT HEADEDNESS. Stress terminated due to completion of protocol. 2. Perfusion imaging study demonstrates gut uptake artifact in the apical lateral wall without evidence of  ischemia or scar. The LV was dilated both at rest and stress. The left ventricular ejection function was low normal at 55% This represents a low risk study. Lower Extremity Dopplers 11/20/2014 No hemodynamically significant stenoses are identified in the bilateral lower extremity arterial system.This exam reveals normal perfusion of both the lower extremities with right ABI 0.97 and left ABI 1.07 with triphasic waveforms at foot. Severe diffuse mixed plaque noted bilateral. Labwork  Labs 11/22/2015: Potassium 4.5, serum  glucose 67 mg, even 2, serum creatinine 0.86, eGFR 89 mL. Total cholesterol 123, triglycerides 157, HDL 44, LDL 63. Digoxin 0.9. 05/24/2015: Creatinine 0.70, potassium 4.5, sodium 133, CMP otherwise normal, CBC normal, digoxin 0.7  Other Problems (April Garrison; 11/29/2015 9:02 AM) Social Hx: Current tobacco use  Current every day smoker. 5-10 cigarettes per day Hospitalized at [facility] in 2010 for a MI (3 stents).     Review of Systems Laverda Page MD; 11/30/2015 12:36 PM) General Present- Weight Loss > 10lbs. (150 Lbs since 2014). Not Present- Appetite Loss, Fatigue and Fever. HEENT Not Present- Blurred Vision and Headache. Respiratory Present- Decreased Exercise Tolerance. Not Present- Bloody sputum, Hemoptysis, Sputum Production, Wakes up from Sleep Wheezing or Short of Breath and Wheezing. Note: Has sleep apnea. Cardiovascular Present- Heart Stent and Hypertension. Not Present- Claudications, Fainting, Leg Cramps and Leg Pain and/or Swelling. Gastrointestinal Not Present- Abdominal Pain, Black, Tarry Stool, Bloody Stool and Difficulty Swallowing. Musculoskeletal Present- Backache (severe and has been taking motrin frequently. Also seeing Chiropractor). Not Present- Joint Pain, Leg Cramps and Leg Weakness. Neurological Not Present- Focal Neurological Symptoms. Psychiatric Not Present- Anxiety, Depression, Suicidal Ideation and Suicidal Planning. Endocrine Not  Present- Cold Intolerance, Heat Intolerance, Polydipsia and Polyuria. Hematology Not Present- Abnormal Bleeding, Easy Bleeding and Easy Bruising. All other systems negative  Vitals (April Garrison; 11/29/2015 9:11 AM) 11/29/2015 9:03 AM Weight: 175.5 lb Height: 72in Body Surface Area: 2.02 m Body Mass Index: 23.8 kg/m  Pulse: 65 (Regular)  P.OX: 98% (Room air) BP: 118/72 (Sitting, Left Arm, Standard)       Physical Exam Laverda Page MD; 11/30/2015 12:39 PM) General Mental Status-Alert. General Appearance-Cooperative, Appears stated age, Not in acute distress. Orientation-Oriented X3. Build & Nutrition-Well built and Well nourished.  Head and Neck Thyroid Gland Characteristics - no palpable nodules, no palpable enlargement.  Chest and Lung Exam Chest and lung exam reveals -quiet, even and easy respiratory effort with no use of accessory muscles and on auscultation, normal breath sounds, no adventitious sounds.  Cardiovascular Cardiovascular examination reveals -carotid auscultation reveals no bruits. Inspection Jugular vein - Right - No Distention. Auscultation Heart Sounds - S1 is variable, S2 WNL and No gallop present. Murmurs & Other Heart Sounds: Murmur - Location - Apex. Timing - Mid-systolic. Grade - II/VI. Character - High pitched. Radiation - Left axilla.  Abdomen Palpation/Percussion Normal exam - Non Tender and No hepatosplenomegaly. Auscultation Normal exam - Bowel sounds normal.  Peripheral Vascular Lower Extremity Palpation - Femoral pulse - Bilateral - Normal. Popliteal pulse - Bilateral - Normal. Dorsalis pedis pulse - Bilateral - Normal. Posterior tibial pulse - Bilateral - Normal. Carotid arteries - Bilateral-No Carotid bruit.  Neurologic Motor-Grossly intact without any focal deficits.  Musculoskeletal Global Assessment Left Lower Extremity - normal range of motion without pain. Right Lower Extremity - normal range  of motion without pain.    Assessment & Plan Laverda Page MD; 11/30/2015 12:42 PM) CAD S/P percutaneous coronary angioplasty (I25.10) Story: Acute inferior MI on 12/23/2010 S/P mid RCA non drug eluting stent implantation, staged proximal LAD DES stent 12/26/2010.  Exercise sestamibi stress test 11/19/2014: 1. The resting electrocardiogram demonstrated atrial fibrillation, normal resting conduction and nonspecific ST-T changes. Stress EKG is non-diagnostic for ischemia as it a pharmacologic stress using Lexiscan. Stress symptoms included dyspnea. 2. The perfusion imaging study demonstrates the left ventricle to be dilated both and rest and stress images at 218 mL. Raw images reveal mild diaphragmatic attenuation. Perfusion imaging study demonstrates  inferior wall decreased uptake, extending from the base to the apex including apical anterior and apical wall. There is no statistically significant reversibility. This represents a soft tissue  EKG 11/16/2014: Atrial fibrillation with controlled response, IVCD, nonspecific ST segment sagging in the inferior leads. No evidence of ischemia. No change from EKG 05/14/2014. Chronic atrial fibrillation (I48.2) Story: Failed Caridoversion 09/20/2012 Current Plans Complete electrocardiogram (93000) Essential hypertension (I10) S/P abdominal aortic aneurysm repair (Z56.387) Story: AAA, underwent endovascular repair on 11/29/2014 by placement of stent graft by Dr. Annamarie Major. Aortic stenosis, mild (I35.0) Future Plans 01/14/2016: Echocardiography, transthoracic, real-time with image documentation (2D), includes M-mode recording, when performed, complete, with spectral Doppler echocardiography, and with color flow Doppler echocardiography (56433) - one time Low back pain at multiple sites (M54.5) Social Hx: Current tobacco use Story: 5-10 cigarettes per day Labwork Story: Labs 11/22/2015: Potassium 4.5, serum glucose 67 mg, even 2, serum creatinine 0.86,  eGFR 89 mL. Total cholesterol 123, triglycerides 157, HDL 44, LDL 63. Digoxin 0.9.  05/24/2015: Creatinine 0.70, potassium 4.5, sodium 133, CMP otherwise normal, CBC normal, digoxin 0.7 PAD (peripheral artery disease) (I73.9) Story: AAA, underwent endovascular repair on 11/29/2014 by placement of stent graft by Dr. Annamarie Major. Screening for poopliteal A. aneurysm with Lower extremity arterial duplex 11/20/2014: No hemodynamically significant stenoses are identified in the bilateral lower extremity arterial system.This exam reveals normal perfusion of both the lower extremities with right ABI 0.97 and left ABI 1.07 with triphasic waveforms at foot. Severe diffuse mixed plaque noted bilateral.  Mitral regurgitation: Echocardiogram 01/14/2016: 1. Left ventricle cavity is normal in size. Mild to moderate concentric hypertrophy of the left ventricle. Normal global wall motion. Unable to evaluate diastolic function due to A. Fibrillation. Calculated EF 66%. 2. Left atrial cavity is massively  dilated at 7.4 cm. Right atrial cavity is severely dilated. 3. The MV annulus appears to be borderline dilated due to massive LA enlargement. There is incomplete coaption of the MV leaflets and mild prolapse of the anterior MV leaflet with  severe posteriorly directed MR. 4. Moderate tricuspid regurgitation. Moderate pulmonary hypertension. Pulmonary artery systolic pressure is estimated at 51 mm Hg. 5. IVC is dilated with a respiratory response of <50%. 6. Compared to  03/24/2012, LVEF improved from 45% and mild to moderate MR is now severe. Pulmonary hypertension new.   Current Plans Mechanism of underlying disease process and action of medications discussed with the patient. I discussed primary/secondary prevention and also dietary counceling was done. He is here on a six-month office visit to follow-up of atrial fibrillation, hypertension and coronary artery disease. He is presently doing well, has maintained  weight loss and has reduced his weight from 326 pounds to 175 pounds... Advised him not to lose any weight further.. I have again discussed with him regarding smoking cessation. From cardiac standpoint he remained stable without angina pectoris or heart failure. The aorticsclerotic/stenotic murmur appears to be slightly prominent. I'll repeat echocardiogram. Otherwise I'll see him back in 6 months or sooner if problems  He has chronic back pain and I have prescribed him Percocet and there is no abuse potential.  Addendum: I discussed over telephone after the fact, the results of the Echo with the patient and discussed TEE. He is aware of risks including aspiration pneumonia and esophageal perforation and is willing to proceed.   CC Dr. Jilda Panda.

## 2016-02-04 ENCOUNTER — Encounter (HOSPITAL_COMMUNITY): Payer: Self-pay

## 2016-02-04 ENCOUNTER — Ambulatory Visit (HOSPITAL_COMMUNITY)
Admission: RE | Admit: 2016-02-04 | Discharge: 2016-02-04 | Disposition: A | Discharge status: Home / Self Care | Payer: BLUE CROSS/BLUE SHIELD | Source: Ambulatory Visit | Attending: Cardiology | Admitting: Cardiology

## 2016-02-04 ENCOUNTER — Encounter (HOSPITAL_COMMUNITY)
Admission: RE | Disposition: A | Discharge status: Home / Self Care | Payer: Self-pay | Source: Ambulatory Visit | Attending: Cardiology

## 2016-02-04 ENCOUNTER — Ambulatory Visit (HOSPITAL_COMMUNITY): Payer: BLUE CROSS/BLUE SHIELD

## 2016-02-04 DIAGNOSIS — Z7901 Long term (current) use of anticoagulants: Secondary | ICD-10-CM | POA: Diagnosis not present

## 2016-02-04 DIAGNOSIS — I340 Nonrheumatic mitral (valve) insufficiency: Secondary | ICD-10-CM | POA: Diagnosis present

## 2016-02-04 DIAGNOSIS — Z955 Presence of coronary angioplasty implant and graft: Secondary | ICD-10-CM | POA: Diagnosis not present

## 2016-02-04 DIAGNOSIS — Z79899 Other long term (current) drug therapy: Secondary | ICD-10-CM | POA: Diagnosis not present

## 2016-02-04 DIAGNOSIS — I482 Chronic atrial fibrillation: Secondary | ICD-10-CM | POA: Diagnosis not present

## 2016-02-04 DIAGNOSIS — E782 Mixed hyperlipidemia: Secondary | ICD-10-CM | POA: Diagnosis not present

## 2016-02-04 DIAGNOSIS — I10 Essential (primary) hypertension: Secondary | ICD-10-CM | POA: Diagnosis not present

## 2016-02-04 DIAGNOSIS — I2510 Atherosclerotic heart disease of native coronary artery without angina pectoris: Secondary | ICD-10-CM | POA: Insufficient documentation

## 2016-02-04 DIAGNOSIS — I252 Old myocardial infarction: Secondary | ICD-10-CM | POA: Insufficient documentation

## 2016-02-04 DIAGNOSIS — I272 Other secondary pulmonary hypertension: Secondary | ICD-10-CM | POA: Diagnosis not present

## 2016-02-04 DIAGNOSIS — G4700 Insomnia, unspecified: Secondary | ICD-10-CM | POA: Diagnosis not present

## 2016-02-04 DIAGNOSIS — F17210 Nicotine dependence, cigarettes, uncomplicated: Secondary | ICD-10-CM | POA: Insufficient documentation

## 2016-02-04 DIAGNOSIS — G4733 Obstructive sleep apnea (adult) (pediatric): Secondary | ICD-10-CM | POA: Insufficient documentation

## 2016-02-04 DIAGNOSIS — I420 Dilated cardiomyopathy: Secondary | ICD-10-CM | POA: Diagnosis not present

## 2016-02-04 HISTORY — PX: TEE WITHOUT CARDIOVERSION: SHX5443

## 2016-02-04 SURGERY — ECHOCARDIOGRAM, TRANSESOPHAGEAL
Anesthesia: Moderate Sedation

## 2016-02-04 MED ORDER — MIDAZOLAM HCL 10 MG/2ML IJ SOLN
INTRAMUSCULAR | Status: DC | PRN
  Administered 2016-02-04: 3 mg via INTRAVENOUS
  Administered 2016-02-04: 2 mg via INTRAVENOUS

## 2016-02-04 MED ORDER — SODIUM CHLORIDE 0.9 % IV SOLN: INTRAVENOUS | Status: DC

## 2016-02-04 MED ORDER — FENTANYL CITRATE (PF) 100 MCG/2ML IJ SOLN
INTRAMUSCULAR | Status: DC | PRN
  Administered 2016-02-04: 50 ug via INTRAVENOUS

## 2016-02-04 MED ORDER — FENTANYL CITRATE (PF) 100 MCG/2ML IJ SOLN
INTRAMUSCULAR | Status: AC
  Filled 2016-02-04: qty 2

## 2016-02-04 MED ORDER — MIDAZOLAM HCL 5 MG/ML IJ SOLN
INTRAMUSCULAR | Status: AC
  Filled 2016-02-04: qty 2

## 2016-02-04 NOTE — Progress Notes (Signed)
  Echocardiogram 2D Echocardiogram has been performed.  Earl SavoyCasey N Jahmil Martin 02/04/2016, 2:18 PM

## 2016-02-04 NOTE — CV Procedure (Signed)
TEE: Under moderate sedation, TEE was performed without complications: LV: Normal. Normal EF. RV: Normal LA: Severely enlarged > 7cm. Left atrial appendage: Very enlarged.  No  thrombus. Low function. Inter atrial septum is intact without defect.  RA: Normal  MV: Mild prolapse of the anterior leaflet with posteriorly directed severe MR with reversal of flow in both left and right pulmonary veins.  TV: Normal  Mild  TR. Mild pulmonary hypertension.  AV: Normal. No AI or AS. PV: Normal. Trace PI.  Thoracic and ascending aorta: Normal with minimal atheromatous changes.  Conscious sedation protocol was followed, I personally administered conscious sedation and monitored the patient. Patient received 5 milligrams of Versed and 50 mcg Fentanyl . Patient tolerated the procedure well and there was no complication from conscious sedation. Time administered was 17 minutes.

## 2016-02-04 NOTE — Interval H&P Note (Signed)
History and Physical Interval Note:  02/04/2016 12:31 PM  Earl EvenerWilliam E Duba  has presented today for surgery, with the diagnosis of mitral regurgitation  The various methods of treatment have been discussed with the patient and family. After consideration of risks, benefits and other options for treatment, the patient has consented to  Procedure(s): TRANSESOPHAGEAL ECHOCARDIOGRAM (TEE) (N/A) as a surgical intervention .  The patient's history has been reviewed, patient examined, no change in status, stable for surgery.  I have reviewed the patient's chart and labs.  Questions were answered to the patient's satisfaction.     Yates DecampGANJI, Shareese Macha

## 2016-02-04 NOTE — Discharge Instructions (Signed)

## 2016-05-01 NOTE — Addendum Note (Signed)
Addended by: Burton ApleyPETTY, Tyrian Peart A on: 05/01/2016 02:52 PM   Modules accepted: Orders

## 2016-07-21 ENCOUNTER — Encounter: Payer: Self-pay | Admitting: Cardiology

## 2017-01-25 ENCOUNTER — Other Ambulatory Visit (HOSPITAL_COMMUNITY): Payer: BLUE CROSS/BLUE SHIELD

## 2017-01-25 ENCOUNTER — Ambulatory Visit: Payer: BLUE CROSS/BLUE SHIELD | Admitting: Surgery

## 2017-03-08 ENCOUNTER — Encounter: Payer: Self-pay | Admitting: Surgery

## 2017-03-08 ENCOUNTER — Ambulatory Visit (HOSPITAL_COMMUNITY)
Admission: RE | Admit: 2017-03-08 | Discharge: 2017-03-08 | Disposition: A | Discharge status: Home / Self Care | Payer: BLUE CROSS/BLUE SHIELD | Source: Ambulatory Visit | Attending: Surgery | Admitting: Surgery

## 2017-03-08 ENCOUNTER — Ambulatory Visit (INDEPENDENT_AMBULATORY_CARE_PROVIDER_SITE_OTHER): Payer: BLUE CROSS/BLUE SHIELD | Admitting: Surgery

## 2017-03-08 VITALS — BP 120/83 | HR 90 | Temp 97.4°F | Resp 18 | Ht 72.0 in | Wt 174.0 lb

## 2017-03-08 DIAGNOSIS — I714 Abdominal aortic aneurysm, without rupture, unspecified: Secondary | ICD-10-CM

## 2017-03-08 NOTE — Progress Notes (Signed)
[facility] [place]  Patient name: Earl Martin MRN: [id number] DOB: 10-Aug-1960 Sex: male   REASON FOR VISIT:    Follow up  HISOTRY OF PRESENT ILLNESS:   Earl Martin is a 56 y.o. male who is status post endovascular repair of a 6.3 cm infrarenal abdominal aortic aneurysm on 11/29/2014.  At his initial postoperative visit, he had a type II endoleak, however at his 6 month follow-up, this had resolved and the aneurysm sac had decreased down to 6.0 cm  Lower extremity and carotid Doppler studies in June 2016 were unremarkable.  He is on Xarelto for atrial fibrillation.  He is medically managed for hypertension.  He continues to live in [place].  He denies any abdominal pain or symptoms of claudication.  He has had an uneventful year  PAST MEDICAL HISTORY:   Past Medical History:  Diagnosis Date  . Atrial fibrillation (HCC)   . Complication of anesthesia    belligerant when awakes  . Coronary artery disease   . Dysrhythmia   . Headache   . Heart murmur   . Hypercholesterolemia   . Hypertension   . Myocardial infarction   . Pneumonia    hx  . Sleep apnea    does not use cpap     FAMILY HISTORY:   Family History  Problem Relation Age of Onset  . Heart failure Father   . Cancer Father   . Cancer Sister   . Diabetes Sister     SOCIAL HISTORY:   Social History  Substance Use Topics  . Smoking status: Current Every Day Smoker    Packs/day: 0.50    Years: 30.00    Types: Cigarettes  . Smokeless tobacco: Never Used  . Alcohol use 8.4 oz/week    14 Cans of beer per week     Comment:  " 2-3 beers daily "     ALLERGIES:   No Known Allergies   CURRENT MEDICATIONS:   Current Outpatient Prescriptions  Medication Sig Dispense Refill  . Ascorbic Acid (VITAMIN C) 1000 MG tablet Take 1,000 mg by mouth daily.    . digoxin (LANOXIN) 0.25 MG tablet Take 0.25 mg by mouth daily.    .  hydrochlorothiazide (HYDRODIURIL) 25 MG tablet Take 25 mg by mouth daily as needed (fluid).     Marland Kitchen lisinopril (PRINIVIL,ZESTRIL) 20 MG tablet Take 20 mg by mouth daily.    Marland Kitchen lovastatin (MEVACOR) 40 MG tablet Take 40 mg by mouth at bedtime.    . metoprolol (LOPRESSOR) 50 MG tablet Take 2 tablets (100 mg total) by mouth 2 (two) times daily. 30 tablet 3  . Multiple Vitamin (MULTIVITAMIN) capsule Take 1 capsule by mouth daily.    . nitroGLYCERIN (NITROSTAT) 0.4 MG SL tablet Place 0.4 mg under the tongue every 5 (five) minutes as needed for chest pain.    . Omega-3 Fatty Acids (FISH OIL PO) Take 1 capsule by mouth daily.    Marland Kitchen oxyCODONE-acetaminophen (PERCOCET) 10-325 MG per tablet Take 1 tablet by mouth every 4 (four) hours as needed for pain. 30 tablet 0  . Rivaroxaban (XARELTO) 20 MG TABS tablet Take 20 mg by mouth daily.    Marland Kitchen zolpidem (AMBIEN) 10 MG tablet Take 10 mg by mouth at bedtime as needed for sleep.     No current facility-administered medications for this visit.     REVIEW OF SYSTEMS:    denotes positive finding,  denotes negative finding Cardiac  Comments:  Chest pain or chest pressure:    Shortness of breath upon exertion:    Short of breath when lying flat:    Irregular heart rhythm:        Vascular    Pain in calf, thigh, or hip brought on by ambulation:    Pain in feet at night that wakes you up from your sleep:     Blood clot in your veins:    Leg swelling:         Pulmonary    Oxygen at home:    Productive cough:     Wheezing:         Neurologic    Sudden weakness in arms or legs:     Sudden numbness in arms or legs:     Sudden onset of difficulty speaking or slurred speech:    Temporary loss of vision in one eye:     Problems with dizziness:         Gastrointestinal    Blood in stool:     Vomited blood:         Genitourinary    Burning when urinating:     Blood in urine:        Psychiatric    Major depression:         Hematologic    Bleeding  problems:    Problems with blood clotting too easily:        Skin    Rashes or ulcers:        Constitutional    Fever or chills:      PHYSICAL EXAM:   There were no vitals filed for this visit.  GENERAL: The patient is a well-nourished male, in no acute distress. The vital signs are documented above. CARDIAC: There is a regular rate and rhythm.  VASCULAR: I could not palpate pedal pulses. PULMONARY: Non-labored respirations ABDOMEN: Soft and non-tender .  No pulsatile mass  MUSCULOSKELETAL: There are no major deformities or cyanosis. NEUROLOGIC: No focal weakness or paresthesias are detected. SKIN: There are no ulcers or rashes noted. PSYCHIATRIC: The patient has a normal affect.  STUDIES:   I have reviewed his ultrasound today.  The aneurysm measures 4.5 x 5.1 cm.  Previously was 4.8 x 5.1.  MEDICAL ISSUES:   Status post endovascular aneurysm repair: The patient continues to do very well.  He will continue with surveillance.    Durene Cal, MD [facility] [phone] Pager [phone]

## 2018-04-06 ENCOUNTER — Encounter: Payer: Self-pay | Admitting: Cardiology

## 2018-04-21 ENCOUNTER — Encounter: Payer: Self-pay | Admitting: *Deleted

## 2018-04-22 ENCOUNTER — Ambulatory Visit (HOSPITAL_COMMUNITY)
Admission: RE | Admit: 2018-04-22 | Discharge: 2018-04-22 | Disposition: A | Discharge status: Home / Self Care | Payer: BLUE CROSS/BLUE SHIELD | Source: Ambulatory Visit | Attending: Cardiology | Admitting: Cardiology

## 2018-04-22 ENCOUNTER — Encounter (HOSPITAL_COMMUNITY)
Admission: RE | Disposition: A | Discharge status: Home / Self Care | Payer: Self-pay | Source: Ambulatory Visit | Attending: Cardiology

## 2018-04-22 ENCOUNTER — Other Ambulatory Visit: Payer: Self-pay

## 2018-04-22 ENCOUNTER — Encounter (HOSPITAL_COMMUNITY): Payer: Self-pay | Admitting: Certified Registered Nurse Anesthetist

## 2018-04-22 ENCOUNTER — Ambulatory Visit (HOSPITAL_COMMUNITY): Admit: 2018-04-22 | Payer: BLUE CROSS/BLUE SHIELD | Admitting: Cardiology

## 2018-04-22 ENCOUNTER — Ambulatory Visit (HOSPITAL_COMMUNITY): Payer: BLUE CROSS/BLUE SHIELD | Admitting: Anesthesiology

## 2018-04-22 DIAGNOSIS — I340 Nonrheumatic mitral (valve) insufficiency: Secondary | ICD-10-CM | POA: Diagnosis present

## 2018-04-22 DIAGNOSIS — Z79899 Other long term (current) drug therapy: Secondary | ICD-10-CM | POA: Insufficient documentation

## 2018-04-22 DIAGNOSIS — I4892 Unspecified atrial flutter: Secondary | ICD-10-CM | POA: Insufficient documentation

## 2018-04-22 DIAGNOSIS — G4733 Obstructive sleep apnea (adult) (pediatric): Secondary | ICD-10-CM | POA: Diagnosis not present

## 2018-04-22 DIAGNOSIS — I2584 Coronary atherosclerosis due to calcified coronary lesion: Secondary | ICD-10-CM | POA: Diagnosis not present

## 2018-04-22 DIAGNOSIS — I272 Pulmonary hypertension, unspecified: Secondary | ICD-10-CM | POA: Diagnosis not present

## 2018-04-22 DIAGNOSIS — I482 Chronic atrial fibrillation, unspecified: Secondary | ICD-10-CM | POA: Insufficient documentation

## 2018-04-22 DIAGNOSIS — I2510 Atherosclerotic heart disease of native coronary artery without angina pectoris: Secondary | ICD-10-CM | POA: Insufficient documentation

## 2018-04-22 DIAGNOSIS — Z9889 Other specified postprocedural states: Secondary | ICD-10-CM | POA: Diagnosis not present

## 2018-04-22 DIAGNOSIS — E785 Hyperlipidemia, unspecified: Secondary | ICD-10-CM | POA: Insufficient documentation

## 2018-04-22 DIAGNOSIS — F17210 Nicotine dependence, cigarettes, uncomplicated: Secondary | ICD-10-CM | POA: Diagnosis not present

## 2018-04-22 DIAGNOSIS — Z7901 Long term (current) use of anticoagulants: Secondary | ICD-10-CM | POA: Diagnosis not present

## 2018-04-22 DIAGNOSIS — I420 Dilated cardiomyopathy: Secondary | ICD-10-CM | POA: Diagnosis not present

## 2018-04-22 DIAGNOSIS — Z955 Presence of coronary angioplasty implant and graft: Secondary | ICD-10-CM | POA: Diagnosis not present

## 2018-04-22 DIAGNOSIS — G549 Nerve root and plexus disorder, unspecified: Secondary | ICD-10-CM | POA: Diagnosis not present

## 2018-04-22 DIAGNOSIS — Z8679 Personal history of other diseases of the circulatory system: Secondary | ICD-10-CM | POA: Diagnosis not present

## 2018-04-22 DIAGNOSIS — I10 Essential (primary) hypertension: Secondary | ICD-10-CM | POA: Insufficient documentation

## 2018-04-22 HISTORY — PX: RIGHT/LEFT HEART CATH AND CORONARY ANGIOGRAPHY: CATH118266

## 2018-04-22 HISTORY — PX: TEE WITHOUT CARDIOVERSION: SHX5443

## 2018-04-22 LAB — CBC
HCT: 30.6 % — ABNORMAL LOW (ref 39.0–52.0)
HEMOGLOBIN: 10.1 g/dL — AB (ref 13.0–17.0)
MCH: 31.5 pg (ref 26.0–34.0)
MCHC: 33 g/dL (ref 30.0–36.0)
MCV: 95.3 fL (ref 80.0–100.0)
NRBC: 1.6 % — AB (ref 0.0–0.2)
Platelets: 135 10*3/uL — ABNORMAL LOW (ref 150–400)
RBC: 3.21 MIL/uL — AB (ref 4.22–5.81)
RDW: 13.2 % (ref 11.5–15.5)
WBC: 3.2 10*3/uL — ABNORMAL LOW (ref 4.0–10.5)

## 2018-04-22 LAB — BASIC METABOLIC PANEL
ANION GAP: 11 (ref 5–15)
BUN: 9 mg/dL (ref 6–20)
CHLORIDE: 101 mmol/L (ref 98–111)
CO2: 17 mmol/L — ABNORMAL LOW (ref 22–32)
Calcium: 7 mg/dL — ABNORMAL LOW (ref 8.9–10.3)
Creatinine, Ser: 0.45 mg/dL — ABNORMAL LOW (ref 0.61–1.24)
GFR calc Af Amer: >60 mL/min (ref >60)
Glucose, Bld: 73 mg/dL (ref 70–99)
POTASSIUM: 4.8 mmol/L (ref 3.5–5.1)
Sodium: 129 mmol/L — ABNORMAL LOW (ref 135–145)

## 2018-04-22 LAB — POCT I-STAT 3, ART BLOOD GAS (G3+)
Acid-base deficit: 4 mmol/L — ABNORMAL HIGH (ref 0.0–2.0)
Bicarbonate: 21.5 mmol/L (ref 20.0–28.0)
O2 Saturation: 97 %
PCO2 ART: 39.5 mmHg (ref 32.0–48.0)
PH ART: 7.344 — AB (ref 7.350–7.450)
PO2 ART: 91 mmHg (ref 83.0–108.0)
TCO2: 23 mmol/L (ref 22–32)

## 2018-04-22 LAB — POCT I-STAT 3, VENOUS BLOOD GAS (G3P V)
Acid-base deficit: 3 mmol/L — ABNORMAL HIGH (ref 0.0–2.0)
BICARBONATE: 23.6 mmol/L (ref 20.0–28.0)
O2 Saturation: 59 %
PO2 VEN: 33 mmHg (ref 32.0–45.0)
TCO2: 25 mmol/L (ref 22–32)
pCO2, Ven: 45.2 mmHg (ref 44.0–60.0)
pH, Ven: 7.326 (ref 7.250–7.430)

## 2018-04-22 SURGERY — RIGHT/LEFT HEART CATH AND CORONARY ANGIOGRAPHY
Anesthesia: LOCAL

## 2018-04-22 SURGERY — ECHOCARDIOGRAM, TRANSESOPHAGEAL
Anesthesia: Monitor Anesthesia Care

## 2018-04-22 MED ORDER — LIDOCAINE HCL (PF) 1 % IJ SOLN
INTRAMUSCULAR | Status: DC | PRN
  Administered 2018-04-22 (×2): 2 mL

## 2018-04-22 MED ORDER — SODIUM CHLORIDE 0.9% FLUSH: 3.0000 mL | Freq: Two times a day (BID) | INTRAVENOUS | Status: DC

## 2018-04-22 MED ORDER — DEXMEDETOMIDINE HCL IN NACL 200 MCG/50ML IV SOLN
INTRAVENOUS | Status: DC | PRN
  Administered 2018-04-22: 8 ug via INTRAVENOUS
  Administered 2018-04-22: 4 ug via INTRAVENOUS
  Administered 2018-04-22 (×2): 8 ug via INTRAVENOUS

## 2018-04-22 MED ORDER — SODIUM CHLORIDE 0.9% FLUSH: 3.0000 mL | INTRAVENOUS | Status: DC | PRN

## 2018-04-22 MED ORDER — SODIUM CHLORIDE 0.9 % WEIGHT BASED INFUSION
1.0000 mL/kg/h | INTRAVENOUS | Status: DC
  Administered 2018-04-22: 1 mL/kg/h via INTRAVENOUS

## 2018-04-22 MED ORDER — NITROGLYCERIN 1 MG/10 ML FOR IR/CATH LAB
INTRA_ARTERIAL | Status: AC
  Filled 2018-04-22: qty 10

## 2018-04-22 MED ORDER — BUTAMBEN-TETRACAINE-BENZOCAINE 2-2-14 % EX AERO
INHALATION_SPRAY | CUTANEOUS | Status: DC | PRN
  Administered 2018-04-22: 2 via TOPICAL

## 2018-04-22 MED ORDER — SODIUM CHLORIDE 0.9 % IV SOLN: INTRAVENOUS | Status: DC

## 2018-04-22 MED ORDER — PROPOFOL 500 MG/50ML IV EMUL
INTRAVENOUS | Status: DC | PRN
  Administered 2018-04-22: 150 ug/kg/min via INTRAVENOUS

## 2018-04-22 MED ORDER — HYDROMORPHONE HCL 1 MG/ML IJ SOLN
INTRAMUSCULAR | Status: DC | PRN
  Administered 2018-04-22: 0.5 mg via INTRAVENOUS

## 2018-04-22 MED ORDER — ASPIRIN 81 MG PO CHEW: 81.0000 mg | CHEWABLE_TABLET | ORAL | Status: DC

## 2018-04-22 MED ORDER — MIDAZOLAM HCL 2 MG/2ML IJ SOLN
INTRAMUSCULAR | Status: DC | PRN
  Administered 2018-04-22: 2 mg via INTRAVENOUS

## 2018-04-22 MED ORDER — VERAPAMIL HCL 2.5 MG/ML IV SOLN
INTRA_ARTERIAL | Status: DC | PRN
  Administered 2018-04-22: 7 mL via INTRA_ARTERIAL

## 2018-04-22 MED ORDER — SODIUM CHLORIDE 0.9 % WEIGHT BASED INFUSION
3.0000 mL/kg/h | INTRAVENOUS | Status: AC
  Administered 2018-04-22: 200 mL via INTRAVENOUS

## 2018-04-22 MED ORDER — MIDAZOLAM HCL 2 MG/2ML IJ SOLN
INTRAMUSCULAR | Status: AC
  Filled 2018-04-22: qty 2

## 2018-04-22 MED ORDER — IOHEXOL 350 MG/ML SOLN
INTRAVENOUS | Status: DC | PRN
  Administered 2018-04-22: 50 mL via INTRACARDIAC

## 2018-04-22 MED ORDER — LIDOCAINE HCL (PF) 1 % IJ SOLN
INTRAMUSCULAR | Status: AC
  Filled 2018-04-22: qty 30

## 2018-04-22 MED ORDER — HEPARIN (PORCINE) IN NACL 1000-0.9 UT/500ML-% IV SOLN
INTRAVENOUS | Status: AC
  Filled 2018-04-22: qty 1000

## 2018-04-22 MED ORDER — LACTATED RINGERS IV SOLN
INTRAVENOUS | Status: DC
  Administered 2018-04-22: 08:00:00 via INTRAVENOUS

## 2018-04-22 MED ORDER — HYDROMORPHONE HCL 1 MG/ML IJ SOLN
INTRAMUSCULAR | Status: AC
  Filled 2018-04-22: qty 0.5

## 2018-04-22 MED ORDER — HEPARIN (PORCINE) IN NACL 1000-0.9 UT/500ML-% IV SOLN
INTRAVENOUS | Status: DC | PRN
  Administered 2018-04-22 (×2): 500 mL

## 2018-04-22 MED ORDER — SODIUM CHLORIDE 0.9 % IV SOLN: 250.0000 mL | INTRAVENOUS | Status: DC | PRN

## 2018-04-22 MED ORDER — HEPARIN SODIUM (PORCINE) 1000 UNIT/ML IJ SOLN
INTRAMUSCULAR | Status: DC | PRN
  Administered 2018-04-22: 3000 [IU] via INTRAVENOUS

## 2018-04-22 MED ORDER — PROPOFOL 10 MG/ML IV BOLUS
INTRAVENOUS | Status: DC | PRN
  Administered 2018-04-22: 20 mg via INTRAVENOUS
  Administered 2018-04-22: 30 mg via INTRAVENOUS
  Administered 2018-04-22: 20 mg via INTRAVENOUS

## 2018-04-22 MED ORDER — VERAPAMIL HCL 2.5 MG/ML IV SOLN
INTRAVENOUS | Status: AC
  Filled 2018-04-22: qty 2

## 2018-04-22 MED ORDER — PHENYLEPHRINE 40 MCG/ML (10ML) SYRINGE FOR IV PUSH (FOR BLOOD PRESSURE SUPPORT)
PREFILLED_SYRINGE | INTRAVENOUS | Status: DC | PRN
  Administered 2018-04-22 (×2): 80 ug via INTRAVENOUS

## 2018-04-22 MED ORDER — LIDOCAINE 2% (20 MG/ML) 5 ML SYRINGE
INTRAMUSCULAR | Status: DC | PRN
  Administered 2018-04-22: 80 mg via INTRAVENOUS

## 2018-04-22 SURGICAL SUPPLY — 13 items
CATH BALLN WEDGE 5F 110CM (CATHETERS) ×2 IMPLANT
CATH INFINITI JR4 5F (CATHETERS) ×2 IMPLANT
CATH OPTITORQUE TIG 4.0 5F (CATHETERS) ×2 IMPLANT
DEVICE RAD COMP TR BAND LRG (VASCULAR PRODUCTS) ×2 IMPLANT
GLIDESHEATH SLEND A-KIT 6F 20G (SHEATH) ×2 IMPLANT
GUIDEWIRE INQWIRE 1.5J.035X260 (WIRE) ×1 IMPLANT
INQWIRE 1.5J .035X260CM (WIRE) ×2
KIT HEART LEFT (KITS) ×2 IMPLANT
PACK CARDIAC CATHETERIZATION (CUSTOM PROCEDURE TRAY) ×2 IMPLANT
SHEATH GLIDE SLENDER 4/5FR (SHEATH) ×2 IMPLANT
TRANSDUCER W/STOPCOCK (MISCELLANEOUS) ×2 IMPLANT
TUBING CIL FLEX 10 FLL-RA (TUBING) ×2 IMPLANT
WIRE EMERALD 3MM-J .025X260CM (WIRE) ×2 IMPLANT

## 2018-04-22 NOTE — CV Procedure (Signed)
TEE: Under deep sedation with anesthetist (CRNA), TEE was performed without complications: LV: Left ventricle is severely dilated dilated at 7.8 cm.  Left ventricular end-diastolic size is 4.4 cm, LVEF 16%  RV: Normal size and function. LA: Massively dilated.  Left atrial appendage is very small and function is reduced.  No thrombus.  Inter atrial septum is intact without defect.  RA: Mild to moderately dilated.  MV: Mitral valve appears to be normal in thickness.  The annulus is dilated.  There is minimal restriction of the posterior mitral leaflet.  There is severe mostly posteriorly directed mitral regurgitation with reversal of flow in both the left and right lower pulmonary vein, right upper vein not visualized.  TV: Mild to moderate TR, moderate pulmonary hypertension, RV systolic pressure 42 mmHg.   AV: Normal. No AI or AS. PV: Normal. Trace PI.  Thoracic and ascending aorta: Normal without significant plaque or atheromatous changes. Pulmonary artery: Mildly dilated at 3.4 cm.

## 2018-04-22 NOTE — H&P (Addendum)
OFFICE VISIT NOTES COPIED TO EPIC FOR DOCUMENTATION  History of Present Illness Earl Maine FNP-C; 01/20/2018 9:47 AM) Patient words: 3 month f/u for mitral regurgitation; Last office visit 10/04/17.  The patient is a 57 year old male who presents for a Follow-up for Mitral regurgitation. Earl Martin is a Caucasian male with severe mitral regurgitation noted in June 2017, I have been monitoring his mitral regurgitation very closely with symptoms and also repeat echocardiogram. He now presents here for 3 month office visit. Presently doing well and except for very mild dyspnea that is stable. Denies any leg swelling, fatigue, or chest pain.  He has history of known coronary artery disease, hypertension, hyperlipidemia, chronic atrial fibrillation and chronic back pain due to spinal stenosis. Last nuclear stress test was on 11/19/2014 revealing dilated left ventricle, inferior and inferoapical wall scar versus soft tissue attenuation, preserved EF at 58%. He is tolerating anticoagulation without any bleeding complications. He also has abdominal aortic aneurysm status post repair on 11/29/2014 by placement of stent graft by Dr. Annamarie Major.   Problem List/Past Medical (April Harrington; 01/20/2018 9:11 AM) CAD S/P percutaneous coronary angioplasty (I25.10)  Acute inferior MI on 12/23/2010 S/P mid RCA non drug eluting stent implantation, staged proximal LAD DES stent 12/26/2010. Exercise sestamibi stress test 11/19/2014: 1. The resting electrocardiogram demonstrated atrial fibrillation, normal resting conduction and nonspecific ST-T changes. Stress EKG is non-diagnostic for ischemia as it a pharmacologic stress using Lexiscan. Stress symptoms included dyspnea. 2. The perfusion imaging study demonstrates the left ventricle to be dilated both and rest and stress images at 218 mL. Raw images reveal mild diaphragmatic attenuation. Perfusion imaging study demonstrates inferior wall decreased uptake,  extending from the base to the apex including apical anterior and apical wall. There is no statistically significant reversibility. This represents a soft tissue Essential hypertension (I10)  EKG 10/04/2017: Atrial fibrillation with controlled response at the rate of 66 bpm, normal axis, IVCD, LVH. Normal QT interval. Nonspecific ST abnormality. Hyperlipidemia, group A (E78.00)  Obstructive sleep apnea, adult (G47.33)  Severe sleep apnea by sleep study 08/20/09. Patient not on CPAP. Unable to tolerate. Feels claustrophobic. Tobacco abuse (Z72.0)  Mixed hyperlipidemia (E78.2)  Insomnia, unspecified type (G47.00)  Malaise and fatigue (R53.81, R53.83)  Exacerbation of chronic back pain (M54.9)  PAD (peripheral artery disease) (I73.9)  AAA, underwent endovascular repair on 11/29/2014 by placement of stent graft by Dr. Annamarie Major. Screening for poopliteal A. aneurysm with Lower extremity arterial duplex 11/20/2014: No hemodynamically significant stenoses are identified in the bilateral lower extremity arterial system.This exam reveals normal perfusion of both the lower extremities with right ABI 0.97 and left ABI 1.07 with triphasic waveforms at foot. Severe diffuse mixed plaque noted bilateral. Low back pain at multiple sites (M54.5)  Severe mitral regurgitation (I34.0)  TEE 02/04/2016: Normal LV systolic function, normal LV size. Mild to moderate anterior mitral leaflet prolapse with resultant severe posteriorly directed MR with reversal of flow in pulmonary veins. Mild pulmonary hypertension, PA pressure about 35 mmHg. Massive LA enlargement Labwork  Labs 11/22/2015: Potassium 4.5, serum glucose 67 mg, serum creatinine 0.86, eGFR 89 mL. Total cholesterol 123, triglycerides 157, HDL 44, LDL 63. Digoxin 0.9. 05/24/2015: Creatinine 0.70, potassium 4.5, sodium 133, CMP otherwise normal, CBC normal, digoxin 0.7 SBE (subacute bacterial endocarditis) prophylaxis candidate (Z29.8)  Chronic atrial  fibrillation (I48.2) [09/20/2012]: S/P abdominal aortic aneurysm repair (Y63.785) [11/29/2014]: AAA, underwent endovascular repair on 11/29/2014 by placement of stent graft by Dr. Annamarie Major.  Allergies (April Harrington;  01/20/2018 9:11 AM) No Known Drug Allergies [04/14/2012]:  Family History (April Harrington; 01/20/2018 9:11 AM) Mother  living-healthy. Father  Deceased. at age 54-unknown reason. Sister 2  1-Deceased from Cancer; 1-Living, older  Social History (April Harrington; 01/20/2018 9:11 AM) Alcohol Use  Drinks beer. 2 daily varies daily Marital status  Divorced. Number of Children  2. Living Situation  Lives alone. Current tobacco use  Current every day smoker. 1/2 ppd  Past Surgical History (April Harrington; 01/20/2018 9:11 AM) Cardioversion external [id number])  Outpatient, Date: 09/20/2012, No post-op complications. Rotator Cuff Repair - Right [2011]: Hernia Repair [03/2013]: Abdominal Aortic Aneurysm Repair [11/29/2014]: ABDOMINAL AORTIC ENDOVASCULAR STENT GRAFT-Gore -C3-aorto by common iliac (Dr. Annamarie Major) Oral Surgery [02/2017]:  Medication History Earl Maine, FNP-C; 01/20/2018 9:48 AM) Digoxin (250MCG Tablet, 1 (one) Tablet Oral daily, Taken starting 01/10/2018) Active. Xarelto (20MG Tablet, 1 Tablet Oral daily in the evening after dinner, Taken starting 12/13/2017) Active. (PA approval on 12/29/12.) Metoprolol Tartrate (100MG Tablet, 1 (one) Tablet Oral two times daily, Taken starting 12/13/2017) Active. Lisinopril (40MG Tablet, 1 (one) Tablet Oral daily, Taken starting 12/13/2017) Active. Zolpidem Tartrate (10MG Tablet, 1 (one) Tablet Tablet Tablet T Oral at bedtime if needed for sleep, Taken starting 03/25/2017) Active. (faxed in refill 03/09/2013 with 2 refills.) Percocet (10-325MG Tablet, 1 (one) Tablet Tablet Tablet T Oral two times daily, as needed, Taken starting 03/25/2017) Active. Nitrostat (0.4MG Tab Sublingual, 1 (one) Tab Sublingual  Tab Sub Sublingual every 5 minutes as needed for chest pain., Taken starting 03/25/2017) Active. Amoxicillin (500MG Tablet, 4 (four) Tablet Tablet Oral Once 1 hour before dental work as needed, Taken starting 03/25/2017) Active. Lovastatin (40MG Tablet, 1 Oral daily) Active. Multiple Vitamin (1 (one) Oral daily) Active. Vitamin C (1000MG Tablet, 1 Oral daily) Active. Omega 3 (1000MG Capsule, 1 Oral daily) Active. Medications Reconciled (verbally with pt; no list or medication present, no change per pt)  Other Problems (April Harrington; 01/20/2018 9:11 AM) Hospitalized at [facility] in 2010 for a MI (3 stents).  Social Hx: Current tobacco use  Current every day smoker. 5-10 cigarettes per day    Review of Systems Earl Maine, FNP-C; 01/20/2018 9:47 AM) General Not Present- Appetite Loss, Fatigue, Fever and Weight Loss > 10lbs.Marland Kitchen HEENT Not Present- Blurred Vision and Headache. Respiratory Present- Difficulty Breathing on Exertion (mild and stable). Not Present- Bloody sputum, Hemoptysis, Sputum Production, Wakes up from Sleep Wheezing or Short of Breath and Wheezing. Note: Has sleep apnea. Cardiovascular Present- Heart Stent. Not Present- Claudications, Fainting, Leg Cramps and Leg Pain and/or Swelling. Gastrointestinal Not Present- Abdominal Pain, Black, Tarry Stool, Bloody Stool and Difficulty Swallowing. Musculoskeletal Present- Backache (severe and has been taking motrin frequently. Also seeing Chiropractor). Not Present- Joint Pain, Leg Cramps and Leg Weakness. Neurological Not Present- Focal Neurological Symptoms. Psychiatric Not Present- Anxiety, Depression, Suicidal Ideation and Suicidal Planning. Endocrine Not Present- Cold Intolerance, Heat Intolerance, Polydipsia and Polyuria. Hematology Not Present- Abnormal Bleeding, Easy Bleeding and Easy Bruising. All other systems negative  Vitals (April Harrington; 01/20/2018 9:20 AM) 01/20/2018 9:14 AM Weight: 178.44 lb Height:  72in Body Surface Area: 2.03 m Body Mass Index: 24.2 kg/m  Pulse: 73 (Regular)  P.OX: 98% (Room air) BP: 113/71 (Sitting, Left Arm, Standard)  Physical Exam Earl Maine FNP-C; 01/20/2018 9:27 AM) General Mental Status-Alert. General Appearance-Cooperative, Appears stated age, Not in acute distress. Orientation-Oriented X3. Build & Nutrition-Well built and Well nourished.  Head and Neck Thyroid Gland Characteristics - no palpable nodules, no palpable enlargement.  Chest and Lung Exam Chest and lung exam reveals -quiet, even and easy respiratory effort with no use of accessory muscles and on auscultation, normal breath sounds, no adventitious sounds.  Cardiovascular Cardiovascular examination reveals -carotid auscultation reveals no bruits. Inspection Jugular vein - Right - No Distention. Auscultation Heart Sounds - S1 is variable(with decreased intensity), S2 WNL and No gallop present. Murmurs & Other Heart Sounds: Murmur - Location - Apex. Timing - Holosystolic. Grade - III/VI. Character - High pitched. Radiation - Left axilla.  Abdomen Palpation/Percussion Normal exam - Non Tender and No hepatosplenomegaly. Auscultation Normal exam - Bowel sounds normal.  Peripheral Vascular Lower Extremity Palpation - Femoral pulse - Bilateral - Normal. Popliteal pulse - Bilateral - Normal. Dorsalis pedis pulse - Bilateral - Normal. Posterior tibial pulse - Bilateral - Normal. Carotid arteries - Bilateral-No Carotid bruit.  Neurologic Motor-Grossly intact without any focal deficits.  Musculoskeletal Global Assessment Left Lower Extremity - normal range of motion without pain. Right Lower Extremity - normal range of motion without pain.   Assessment & Plan Earl Maine FNP-C; 01/20/2018 9:46 AM) Severe mitral regurgitation (I34.0) Echo- 03/29/2018 1. Left ventricle cavity is at upper limits of normal in size. LV end systolic dimension is 4.2 cm.  Moderate to severe concentric hypertrophy of the left ventricle. Normal global wall motion. Borderline normal LVEF, but systolic function should be considered depressed in view of severe MR. Calculated EF 53%. 2. Left atrial cavity is massively dilated. 3. Right atrial cavity is mildly dilated. 4. Severe (Grade III) mitral regurgitation. Mild calcification of the mitral valve annulus. Mild mitral valve leaflet thickening. Mild prolapse of the mitral valve leaflets. Predominantly post. wall-impinging MR jet color flow area. 5. Mild to moderate tricuspid regurgitation. Moderate pulmonary hypertension with approx. PA syst. pressure of 58 mm of Hg. 6. IVC is dilated with poor inspiration collapse consistent with elevated right atrial pressure. 7. c.f. echo. of 10/05/2017, LVEF is slightly lower, LVIDs has slightly increased & Pul. HTN. has increased in severity. No other diagnostic change.  CAD S/P percutaneous coronary angioplasty (I25.10) Story: Acute inferior MI on 12/23/2010 S/P mid RCA non drug eluting stent implantation, staged proximal LAD DES stent 12/26/2010.  Exercise sestamibi stress test 11/19/2014: 1. The resting electrocardiogram demonstrated atrial fibrillation, normal resting conduction and nonspecific ST-T changes. Stress EKG is non-diagnostic for ischemia as it a pharmacologic stress using Lexiscan. Stress symptoms included dyspnea. 2. The perfusion imaging study demonstrates the left ventricle to be dilated both and rest and stress images at 218 mL. Raw images reveal mild diaphragmatic attenuation. Perfusion imaging study demonstrates inferior wall decreased uptake, extending from the base to the apex including apical anterior and apical wall. There is no statistically significant reversibility. This represents a soft tissue Chronic atrial fibrillation (I48.2) Impression: EKG 03/24/2017: Atrial flutter ablation with controlled ventricular response, poor R-wave progression,  probably normal variant. Nonspecific ST abnormality. Normal QT interval. No significant change from EKG 09/09/2016. Essential hypertension (I10) Story: EKG 10/04/2017: Atrial fibrillation with controlled response at the rate of 66 bpm, normal axis, IVCD, LVH. Normal QT interval. Nonspecific ST abnormality. SBE (subacute bacterial endocarditis) prophylaxis candidate (Z29.8) S/P abdominal aortic aneurysm repair (W43.154) Story: AAA, underwent endovascular repair on 11/29/2014 by placement of stent graft by Dr. Annamarie Major. Future Plans 12/07/2017: Duplex scan of aorta, inferior vena cava, iliac vasculature, or bypass grafts; complete study (00867) - one time Laboratory examination (Z01.89)   Recommendations:  Earl Martin is a Caucasian male with CAD, severe MR noted  in June 2018, hypertension, hyperlipidemia, chronic atrial fibrillation and chronic back pain due to spinal stenosis. Last nuclear stress test was on 11/19/2014 revealing dilated left ventricle, inferior and inferoapical wall scar versus soft tissue attenuation, preserved EF at 58%. He is tolerating anticoagulation without any bleeding complications. He also has abdominal aortic aneurysm status post repair on 11/29/2014 by placement of stent graft by Dr. Annamarie Major.  Patient presents for 3 month follow-up for severe MR. He remains essentially asymptomatic except for mild dyspnea that is unchanged. No significant changes to recent echocardiogram, will continue to need echocardiogram every 6 months for surveillance. I have advised him to contact me for any changes in symptoms particularly shortness of breath, fatigue, or leg edema. He will need mitral valve repair at some point.  Also has CAD and chronic atrial fibrillation that are stable. On appropriate medical therapy. He continues to follow Dr. Trula Slade for management of AAA that he reports has been stable. Will repeat echocardiogram in October 2019 and seen back in 3 months  for close monitoring.  *I have discussed this case with Dr. Einar Gip and he personally examined the patient and participated in formulating the plan.*  CC Dr. Jilda Panda.  Signed by Earl Maine, FNP-C (01/20/2018 9:48 AM)  Addendum: I have discussed the results of the Echo with the patient and now has severe MR and LVEF is trending down, needs MV repair. Will schedule for TEE. Patient agrees to proceed.  Addendum: No change in symptoms or physical exam. He will need MV repair and will also need left and right heart catheterization.  Discussed risks, benefits and alternatives of angiogram including but not limited to <1% risk of death, stroke, MI, need for urgent surgical revascularization, renal failure, but not limited to thest. patient is willing to proceed. Consent obtained. RN present during discussions outside the room.

## 2018-04-22 NOTE — Discharge Instructions (Signed)

## 2018-04-22 NOTE — Progress Notes (Signed)
Mr joshva labreck cath lab holding area in stable condition to short stay. Rt radial and rt brachial sites intact and unremarkable. Pt understands post instructions.

## 2018-04-22 NOTE — Interval H&P Note (Signed)
History and Physical Interval Note:  04/22/2018 11:03 AM  Earl Martin  has presented today for surgery, with the diagnosis of cp  The various methods of treatment have been discussed with the patient and family. After consideration of risks, benefits and other options for treatment, the patient has consented to  Procedure(s): RIGHT/LEFT HEART CATH AND CORONARY ANGIOGRAPHY (N/A) as a surgical intervention .  The patient's history has been reviewed, patient examined, no change in status, stable for surgery.  I have reviewed the patient's chart and labs.  Questions were answered to the patient's satisfaction.     Yates Decamp

## 2018-04-22 NOTE — Interval H&P Note (Signed)
History and Physical Interval Note:  04/22/2018 8:28 AM  Earl Martin  has presented today for surgery, with the diagnosis of MITRAL REGURGITATION  The various methods of treatment have been discussed with the patient and family. After consideration of risks, benefits and other options for treatment, the patient has consented to  Procedure(s): TRANSESOPHAGEAL ECHOCARDIOGRAM (TEE) (N/A) as a surgical intervention .  The patient's history has been reviewed, patient examined, no change in status, stable for surgery.  I have reviewed the patient's chart and labs.  Questions were answered to the patient's satisfaction.     Yates Decamp

## 2018-04-22 NOTE — Anesthesia Preprocedure Evaluation (Signed)
Anesthesia Evaluation  Patient identified by MRN, date of birth, ID band Patient awake  General Assessment Comment:Complication of anesthesia  belligerant when awakes   Reviewed: Allergy & Precautions, NPO status , Patient's Chart, lab work & pertinent test results, reviewed documented beta blocker date and time   History of Anesthesia Complications (+) history of anesthetic complications  Airway Mallampati: II  TM Distance: >3 FB Neck ROM: Full    Dental  (+) Teeth Intact, Dental Advisory Given   Pulmonary sleep apnea , Current Smoker,    breath sounds clear to auscultation       Cardiovascular hypertension, Pt. on medications and Pt. on home beta blockers + CAD, + Past MI, + Cardiac Stents and + Peripheral Vascular Disease  + dysrhythmias Atrial Fibrillation  Rhythm:Regular Rate:Normal     Neuro/Psych negative neurological ROS  negative psych ROS   GI/Hepatic negative GI ROS, Neg liver ROS,   Endo/Other  negative endocrine ROS  Renal/GU negative Renal ROS     Musculoskeletal   Abdominal   Peds  Hematology negative hematology ROS (+)   Anesthesia Other Findings   Reproductive/Obstetrics                             Anesthesia Physical  Anesthesia Plan  ASA: III  Anesthesia Plan: MAC   Post-op Pain Management:    Induction: Intravenous  PONV Risk Score and Plan: 1 and Treatment may vary due to age or medical condition  Airway Management Planned: Nasal Cannula  Additional Equipment:   Intra-op Plan:   Post-operative Plan:   Informed Consent: I have reviewed the patients History and Physical, chart, labs and discussed the procedure including the risks, benefits and alternatives for the proposed anesthesia with the patient or authorized representative who has indicated his/her understanding and acceptance.   Dental advisory given  Plan Discussed with: CRNA, Anesthesiologist  and Surgeon  Anesthesia Plan Comments:         Anesthesia Quick Evaluation

## 2018-04-22 NOTE — Anesthesia Postprocedure Evaluation (Signed)
Anesthesia Post Note  Patient: Earl Martin  Procedure(s) Performed: TRANSESOPHAGEAL ECHOCARDIOGRAM (TEE) (N/A )     Patient location during evaluation: PACU Anesthesia Type: MAC Level of consciousness: awake and alert Pain management: pain level controlled Vital Signs Assessment: post-procedure vital signs reviewed and stable Respiratory status: spontaneous breathing, nonlabored ventilation, respiratory function stable and patient connected to nasal cannula oxygen Cardiovascular status: stable and blood pressure returned to baseline Postop Assessment: no apparent nausea or vomiting Anesthetic complications: no    Last Vitals:  Vitals:   04/22/18 0930 04/22/18 0950  BP: (!) 89/53 92/61  Pulse: 65   Resp: (!) 24   Temp:    SpO2: 95%     Last Pain:  Vitals:   04/22/18 0950  TempSrc:   PainSc: 0-No pain                 Nyle Limb

## 2018-04-22 NOTE — Transfer of Care (Signed)
Immediate Anesthesia Transfer of Care Note  Patient: Earl Martin  Procedure(s) Performed: TRANSESOPHAGEAL ECHOCARDIOGRAM (TEE) (N/A )  Patient Location: Endoscopy Unit  Anesthesia Type:MAC  Level of Consciousness: drowsy and patient cooperative  Airway & Oxygen Therapy: Patient Spontanous Breathing and Patient connected to nasal cannula oxygen  Post-op Assessment: Report given to RN and Post -op Vital signs reviewed and stable  Post vital signs: Reviewed and stable  Last Vitals:  Vitals Value Taken Time  BP    Temp    Pulse 66 04/22/2018  9:01 AM  Resp 27 04/22/2018  9:01 AM  SpO2 93 % 04/22/2018  9:01 AM  Vitals shown include unvalidated device data.  Last Pain:  Vitals:   04/22/18 0758  TempSrc: Oral  PainSc:          Complications: No apparent anesthesia complications

## 2018-04-23 ENCOUNTER — Encounter (HOSPITAL_COMMUNITY): Payer: Self-pay | Admitting: Cardiology

## 2018-04-25 ENCOUNTER — Encounter: Payer: Self-pay | Admitting: Thoracic Surgery (Cardiothoracic Vascular Surgery)

## 2018-04-25 ENCOUNTER — Other Ambulatory Visit: Payer: Self-pay

## 2018-04-25 ENCOUNTER — Institutional Professional Consult (permissible substitution): Payer: BLUE CROSS/BLUE SHIELD | Admitting: Thoracic Surgery (Cardiothoracic Vascular Surgery)

## 2018-04-25 VITALS — BP 126/80 | HR 86 | Resp 18 | Ht 72.0 in | Wt 182.0 lb

## 2018-04-25 DIAGNOSIS — I340 Nonrheumatic mitral (valve) insufficiency: Secondary | ICD-10-CM

## 2018-04-25 DIAGNOSIS — I2510 Atherosclerotic heart disease of native coronary artery without angina pectoris: Secondary | ICD-10-CM

## 2018-04-25 DIAGNOSIS — I4819 Other persistent atrial fibrillation: Secondary | ICD-10-CM | POA: Diagnosis not present

## 2018-04-25 NOTE — Progress Notes (Signed)
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 Diastolic Pressure 22 mmHg  PA Mean 31 mmHg  PW A Wave 13 mmHg  PW V Wave 19 mmHg  PW Mean 14 mmHg  AO Systolic Pressure 86 mmHg  AO Diastolic Pressure 59 mmHg  AO Mean 70 mmHg  LV Systolic Pressure 86 mmHg  LV Diastolic Pressure 3 mmHg  LV EDP 11 mmHg  AOp Systolic Pressure 87 mmHg  AOp Diastolic Pressure 57 mmHg  AOp Mean Pressure 70 mmHg  LVp Systolic Pressure 87 mmHg  LVp Diastolic Pressure 3 mmHg  LVp EDP Pressure 10 mmHg  QP/QS 1  TPVR Index 12.16 HRUI  TSVR Index 25.5 HRUI  PVR SVR Ratio 0.29  TPVR/TSVR Ratio 0.48      Impression:  Patient has stage D severe symptomatic mitral regurgitation with long-standing persistent atrial fibrillation and multivessel coronary artery disease.  He describes stable symptoms of exertional shortness of breath and fatigue consistent with chronic combined systolic and diastolic congestive heart failure, New York Heart Association functional class II.  He also  reports occasional symptoms of exertional chest tightness or chest pressure consistent with possible stable angina pectoris.  I have personally reviewed the patient's transthoracic and transesophageal echocardiograms and recent diagnostic cardiac catheterization.  Echocardiograms document the presence of mild to moderate left ventricular systolic dysfunction with hypokinesis of the posterior lateral wall.  Ejection fraction has been estimated between 55 and 60% in the setting of severe mitral regurgitation.  There is severe left atrial chamber enlargement.  Left ventricular size appears close to normal.  Functional anatomy of the mitral valve is probably a combination of primary and secondary disease with a combination of type I dysfunction related to severe annular enlargement and type IIIb dysfunction related to tethering of the posterior leaflet.  This creates an overriding anterior leaflet with a somewhat eccentric jet of regurgitation directed around the posterior aspect of the left atrium.  There is severe mitral regurgitation with regurgitant volume estimated greater than 60 mL per beat and flow reversal documented in the pulmonary veins.  The patient's mitral regurgitation is continued to progress despite close follow-up with optimal medical therapy.  Patient also has long-standing persistent atrial fibrillation.  Diagnostic cardiac catheterization demonstrates moderate disease in the proximal left anterior descending coronary artery with what was felt to be 50% stenosis just beyond the terminal portion of the previous stent.  This lesion is presumably not flow-limiting but certainly appears angiographically significant.  There is 100% chronic occlusion of the mid right coronary artery.  There is some left-to-right collateral filling of the terminal branch of the distal right coronary system.  However, whether or not the terminal branches of the distal right coronary would be graftable at the time of surgery  is somewhat difficult to say.  I agree the patient needs elective mitral valve repair.  He would likely benefit from concomitant Maze procedure.  He may or may not benefit from concomitant coronary artery bypass grafting.   Plan:  The patient was counseled at length regarding the indications, risks and potential benefits of mitral valve repair.  The rationale for elective surgery has been explained, including a comparison between surgery and continued medical therapy with close follow-up.  The likelihood of successful and durable valve repair has been discussed with particular reference to the findings of their recent echocardiogram.  Based upon these findings and previous experience, I have quoted them a greater than 90 percent likelihood of successful valve repair.  The relative risks and benefits of performing a maze procedure at the time of their surgery  was discussed at length, including the expected likelihood of long term freedom from recurrent symptomatic atrial fibrillation and/or atrial flutter.  Alternative surgical approaches have been discussed including a comparison between conventional sternotomy and minimally-invasive techniques.  The relative risks and benefits of each have been reviewed as they pertain to the patient's specific circumstances, and all of their questions have been addressed.  The relative risks and benefits of concomitant coronary  artery bypass grafting was discussed including the fact that conventional median sternotomy would be required if surgical revascularization of the patient's right coronary artery system were to be planned.  Expectations for the patient's postoperative convalescence have been discussed.  All of his questions have been addressed.  The patient is interested in proceeding with elective surgery in the near future.  He prefers to proceed using conventional median sternotomy so that surgical revascularization could be performed at the time of mitral valve  repair and Maze procedure.  We have discussed possible scheduling options, and the patient wants to think about this further before scheduling a definitive date for surgery.  Prior to surgery he will undergo CT angiography to evaluate the feasibility of peripheral cannulation for surgery and reassess follow-up examination the patient's previous endovascular repair of abdominal aortic aneurysm.  He understands that he will need to stop Xarelto 7 days prior to surgery.  Patient will call return to see [place] in the near future once he has made a decision about scheduling his surgical intervention.  I spent in excess of 90 minutes during the conduct of this office consultation and >50% of this time involved direct face-to-face encounter with the patient for counseling and/or coordination of their care.    Salvatore Decent. Cornelius Moras, MD 04/25/2018 9:46 AM

## 2018-04-25 NOTE — Patient Instructions (Addendum)
Continue all previous medications without any changes at this time  Stop taking Xarelto 7 days prior to your scheduled surgery  Stop smoking immediately and permanently.

## 2018-04-26 ENCOUNTER — Other Ambulatory Visit: Payer: Self-pay | Admitting: *Deleted

## 2018-04-26 DIAGNOSIS — Z01818 Encounter for other preprocedural examination: Secondary | ICD-10-CM

## 2018-04-26 DIAGNOSIS — I2510 Atherosclerotic heart disease of native coronary artery without angina pectoris: Secondary | ICD-10-CM

## 2018-04-26 DIAGNOSIS — I712 Thoracic aortic aneurysm, without rupture, unspecified: Secondary | ICD-10-CM

## 2018-04-26 DIAGNOSIS — I340 Nonrheumatic mitral (valve) insufficiency: Secondary | ICD-10-CM

## 2018-04-26 DIAGNOSIS — I4819 Other persistent atrial fibrillation: Secondary | ICD-10-CM

## 2018-04-26 DIAGNOSIS — I7409 Other arterial embolism and thrombosis of abdominal aorta: Secondary | ICD-10-CM

## 2018-04-27 ENCOUNTER — Encounter: Payer: Self-pay | Admitting: *Deleted

## 2018-04-27 NOTE — Addendum Note (Signed)
Addendum  created 04/27/18 0225 by Bethena Midgetddono, Jarrin Staley, MD   Attestation recorded in Intraprocedure, Intraprocedure Attestations filed

## 2018-05-04 ENCOUNTER — Ambulatory Visit
Admission: RE | Admit: 2018-05-04 | Discharge: 2018-05-04 | Disposition: A | Discharge status: Home / Self Care | Payer: BLUE CROSS/BLUE SHIELD | Source: Ambulatory Visit | Attending: Thoracic Surgery (Cardiothoracic Vascular Surgery) | Admitting: Thoracic Surgery (Cardiothoracic Vascular Surgery)

## 2018-05-04 ENCOUNTER — Encounter: Payer: Self-pay | Admitting: Cardiology

## 2018-05-04 DIAGNOSIS — I712 Thoracic aortic aneurysm, without rupture, unspecified: Secondary | ICD-10-CM

## 2018-05-04 DIAGNOSIS — I340 Nonrheumatic mitral (valve) insufficiency: Secondary | ICD-10-CM

## 2018-05-04 DIAGNOSIS — Z01818 Encounter for other preprocedural examination: Secondary | ICD-10-CM

## 2018-05-04 DIAGNOSIS — I7409 Other arterial embolism and thrombosis of abdominal aorta: Secondary | ICD-10-CM

## 2018-05-04 MED ORDER — IOPAMIDOL (ISOVUE-370) INJECTION 76%
75.0000 mL | Freq: Once | INTRAVENOUS | Status: AC | PRN
  Administered 2018-05-04: 75 mL via INTRAVENOUS

## 2018-05-13 NOTE — Pre-Procedure Instructions (Signed)
Earl EvenerWilliam E Martin  05/13/2018      [facility] [place] [place] [address] Phone: [phone] Fax: [phone]    Your procedure is scheduled on Wed., Dec. 4, 2019 from 8:30AM-4:09PM  [facility] "A" at 6:30AM  Call this number if you have problems the morning of surgery:  [phone]   Remember:  Do not eat or drink after midnight on Dec. 3rd    Take these medicines the morning of surgery with A SIP OF WATER: Digoxin (LANOXIN) and Metoprolol (LOPRESSOR)   If needed: NitroGLYCERIN (NITROSTAT)  Hold Xarelto 7 days (05/11/18) prior to surgery  As of today, stop taking all Other Aspirin Products, Vitamins, Fish oils, and Herbal medications. Also stop all NSAIDS i.e. Advil, Ibuprofen, Motrin, Aleve, Anaprox, Naproxen, BC, Goody Powders, and all Supplements.    Do not wear jewelry.  Do not wear lotions, powders, colognes, or deodorant.  Do not shave 48 hours prior to surgery.  Men may shave face.  Do not bring valuables to the hospital.  [facility] is not responsible for any belongings or valuables.  Contacts, dentures or bridgework may not be worn into surgery.  Leave your suitcase in the car.  After surgery it may be brought to your room.  For patients admitted to the hospital, discharge time will be determined by your treatment team.  Patients discharged the day of surgery will not be allowed to drive home.   Special instructions:  [place]- Preparing For Surgery  Before surgery, you can play an important role. Because skin is not sterile, your skin needs to be as free of germs as possible. You can reduce the number of germs on your skin by washing with CHG (chlorahexidine gluconate) Soap before surgery.  CHG is an antiseptic cleaner which kills germs and bonds with the skin to continue killing germs even after washing.    Oral Hygiene is also  important to reduce your risk of infection.  Remember - BRUSH YOUR TEETH THE MORNING OF SURGERY WITH YOUR REGULAR TOOTHPASTE  Please do not use if you have an allergy to CHG or antibacterial soaps. If your skin becomes reddened/irritated stop using the CHG.  Do not shave (including legs and underarms) for at least 48 hours prior to first CHG shower. It is OK to shave your face.  Please follow these instructions carefully.   1. Shower the NIGHT BEFORE SURGERY and the MORNING OF SURGERY with CHG.   2. If you chose to wash your hair, wash your hair first as usual with your normal shampoo.  3. After you shampoo, rinse your hair and body thoroughly to remove the shampoo.  4. Use CHG as you would any other liquid soap. You can apply CHG directly to the skin and wash gently with a scrungie or a clean washcloth.   5. Apply the CHG Soap to your body ONLY FROM THE NECK DOWN.  Do not use on open wounds or open sores. Avoid contact with your eyes, ears, mouth and genitals (private parts). Wash Face and genitals (private parts)  with your normal soap.  6. Wash thoroughly, paying special attention to the area where your surgery will be performed.  7. Thoroughly rinse your body with warm water from the neck down.  8. DO NOT shower/wash with your normal soap after using and rinsing off the CHG Soap.  9. Pat yourself dry  with a CLEAN TOWEL.  10. Wear CLEAN PAJAMAS to bed the night before surgery, wear comfortable clothes the morning of surgery  11. Place CLEAN SHEETS on your bed the night of your first shower and DO NOT SLEEP WITH PETS.  Day of Surgery:  Do not apply any deodorants/lotions.  Please wear clean clothes to the hospital/surgery center.   Remember to brush your teeth WITH YOUR REGULAR TOOTHPASTE.  Please read over the following fact sheets that you were given. Pain Booklet, Coughing and Deep Breathing, MRSA Information and Surgical Site Infection Prevention

## 2018-05-16 ENCOUNTER — Ambulatory Visit: Payer: BLUE CROSS/BLUE SHIELD | Admitting: Thoracic Surgery (Cardiothoracic Vascular Surgery)

## 2018-05-16 ENCOUNTER — Encounter: Payer: Self-pay | Admitting: Thoracic Surgery (Cardiothoracic Vascular Surgery)

## 2018-05-16 ENCOUNTER — Other Ambulatory Visit: Payer: Self-pay

## 2018-05-16 ENCOUNTER — Encounter (HOSPITAL_COMMUNITY)
Admission: RE | Admit: 2018-05-16 | Discharge: 2018-05-16 | Disposition: A | Discharge status: Home / Self Care | Payer: BLUE CROSS/BLUE SHIELD | Source: Ambulatory Visit | Attending: Thoracic Surgery (Cardiothoracic Vascular Surgery) | Admitting: Thoracic Surgery (Cardiothoracic Vascular Surgery)

## 2018-05-16 ENCOUNTER — Ambulatory Visit (HOSPITAL_COMMUNITY)
Admission: RE | Admit: 2018-05-16 | Discharge: 2018-05-16 | Disposition: A | Discharge status: Home / Self Care | Payer: BLUE CROSS/BLUE SHIELD | Source: Ambulatory Visit | Attending: Thoracic Surgery (Cardiothoracic Vascular Surgery) | Admitting: Thoracic Surgery (Cardiothoracic Vascular Surgery)

## 2018-05-16 ENCOUNTER — Ambulatory Visit (HOSPITAL_BASED_OUTPATIENT_CLINIC_OR_DEPARTMENT_OTHER)
Admission: RE | Admit: 2018-05-16 | Discharge: 2018-05-16 | Disposition: A | Discharge status: Home / Self Care | Payer: BLUE CROSS/BLUE SHIELD | Source: Ambulatory Visit | Attending: Thoracic Surgery (Cardiothoracic Vascular Surgery) | Admitting: Thoracic Surgery (Cardiothoracic Vascular Surgery)

## 2018-05-16 ENCOUNTER — Encounter (HOSPITAL_COMMUNITY): Payer: Self-pay

## 2018-05-16 VITALS — BP 118/85 | HR 65 | Resp 20 | Ht 72.0 in | Wt 194.0 lb

## 2018-05-16 DIAGNOSIS — I2510 Atherosclerotic heart disease of native coronary artery without angina pectoris: Secondary | ICD-10-CM

## 2018-05-16 DIAGNOSIS — I712 Thoracic aortic aneurysm, without rupture, unspecified: Secondary | ICD-10-CM

## 2018-05-16 DIAGNOSIS — Z01818 Encounter for other preprocedural examination: Secondary | ICD-10-CM | POA: Insufficient documentation

## 2018-05-16 DIAGNOSIS — I340 Nonrheumatic mitral (valve) insufficiency: Secondary | ICD-10-CM | POA: Diagnosis not present

## 2018-05-16 DIAGNOSIS — I4819 Other persistent atrial fibrillation: Secondary | ICD-10-CM | POA: Diagnosis not present

## 2018-05-16 DIAGNOSIS — E785 Hyperlipidemia, unspecified: Secondary | ICD-10-CM | POA: Insufficient documentation

## 2018-05-16 DIAGNOSIS — I10 Essential (primary) hypertension: Secondary | ICD-10-CM | POA: Diagnosis not present

## 2018-05-16 LAB — COMPREHENSIVE METABOLIC PANEL
ALBUMIN: 3.5 g/dL (ref 3.5–5.0)
ALT: 24 U/L (ref 0–44)
ANION GAP: 12 (ref 5–15)
AST: 30 U/L (ref 15–41)
Alkaline Phosphatase: 58 U/L (ref 38–126)
BILIRUBIN TOTAL: 1.4 mg/dL — AB (ref 0.3–1.2)
BUN: 9 mg/dL (ref 6–20)
CHLORIDE: 102 mmol/L (ref 98–111)
CO2: 19 mmol/L — ABNORMAL LOW (ref 22–32)
Calcium: 8.8 mg/dL — ABNORMAL LOW (ref 8.9–10.3)
Creatinine, Ser: 0.88 mg/dL (ref 0.61–1.24)
GFR calc Af Amer: >60 mL/min (ref >60)
GFR calc non Af Amer: >60 mL/min (ref >60)
GLUCOSE: 102 mg/dL — AB (ref 70–99)
POTASSIUM: 4.1 mmol/L (ref 3.5–5.1)
Sodium: 133 mmol/L — ABNORMAL LOW (ref 135–145)
TOTAL PROTEIN: 6.8 g/dL (ref 6.5–8.1)

## 2018-05-16 LAB — BLOOD GAS, ARTERIAL
ACID-BASE DEFICIT: 2.1 mmol/L — AB (ref 0.0–2.0)
BICARBONATE: 21.2 mmol/L (ref 20.0–28.0)
Drawn by: 42180
FIO2: 21
O2 Saturation: 98.6 %
PCO2 ART: 30.3 mmHg — AB (ref 32.0–48.0)
PH ART: 7.459 — AB (ref 7.350–7.450)
Patient temperature: 98.6
pO2, Arterial: 110 mmHg — ABNORMAL HIGH (ref 83.0–108.0)

## 2018-05-16 LAB — PULMONARY FUNCTION TEST
DL/VA % pred: 72 %
DL/VA: 3.45 ml/min/mmHg/L
DLCO unc % pred: 57 %
DLCO unc: 20.21 ml/min/mmHg
FEF 25-75 Post: 0.7 L/sec
FEF 25-75 Pre: 0.97 L/sec
FEF2575-%Change-Post: -27 %
FEF2575-%Pred-Post: 21 %
FEF2575-%Pred-Pre: 29 %
FEV1-%Change-Post: -4 %
FEV1-%PRED-POST: 50 %
FEV1-%PRED-PRE: 52 %
FEV1-PRE: 2.07 L
FEV1-Post: 1.98 L
FEV1FVC-%Change-Post: 6 %
FEV1FVC-%Pred-Pre: 79 %
FEV6-%Change-Post: -7 %
FEV6-%PRED-PRE: 65 %
FEV6-%Pred-Post: 60 %
FEV6-POST: 3 L
FEV6-PRE: 3.24 L
FEV6FVC-%Change-Post: 2 %
FEV6FVC-%PRED-POST: 101 %
FEV6FVC-%PRED-PRE: 98 %
FVC-%Change-Post: -9 %
FVC-%PRED-PRE: 66 %
FVC-%Pred-Post: 59 %
FVC-POST: 3.09 L
FVC-PRE: 3.43 L
PRE FEV6/FVC RATIO: 95 %
Post FEV1/FVC ratio: 64 %
Post FEV6/FVC ratio: 97 %
Pre FEV1/FVC ratio: 60 %
RV % pred: 119 %
RV: 2.75 L
TLC % PRED: 86 %
TLC: 6.41 L

## 2018-05-16 LAB — CBC
HEMATOCRIT: 45.8 % (ref 39.0–52.0)
HEMOGLOBIN: 14.7 g/dL (ref 13.0–17.0)
MCH: 28.8 pg (ref 26.0–34.0)
MCHC: 32.1 g/dL (ref 30.0–36.0)
MCV: 89.8 fL (ref 80.0–100.0)
NRBC: 0 % (ref 0.0–0.2)
PLATELETS: 166 10*3/uL (ref 150–400)
RBC: 5.1 MIL/uL (ref 4.22–5.81)
RDW: 13.2 % (ref 11.5–15.5)
WBC: 6.3 10*3/uL (ref 4.0–10.5)

## 2018-05-16 LAB — SURGICAL PCR SCREEN
MRSA, PCR: NEGATIVE
STAPHYLOCOCCUS AUREUS: NEGATIVE

## 2018-05-16 LAB — URINALYSIS, ROUTINE W REFLEX MICROSCOPIC
Bacteria, UA: NONE SEEN
Bilirubin Urine: NEGATIVE
Glucose, UA: NEGATIVE mg/dL
Hgb urine dipstick: NEGATIVE
Ketones, ur: NEGATIVE mg/dL
LEUKOCYTES UA: NEGATIVE
Nitrite: NEGATIVE
Protein, ur: 30 mg/dL — AB
SPECIFIC GRAVITY, URINE: 1.009 (ref 1.005–1.030)
pH: 7 (ref 5.0–8.0)

## 2018-05-16 LAB — TYPE AND SCREEN
ABO/RH(D): A POS
Antibody Screen: NEGATIVE

## 2018-05-16 LAB — APTT: APTT: 34 s (ref 24–36)

## 2018-05-16 LAB — PROTIME-INR
INR: 1.16
PROTHROMBIN TIME: 14.7 s (ref 11.4–15.2)

## 2018-05-16 LAB — HEMOGLOBIN A1C
HEMOGLOBIN A1C: 5.4 % (ref 4.8–5.6)
Mean Plasma Glucose: 108.28 mg/dL

## 2018-05-16 MED ORDER — ALBUTEROL SULFATE (2.5 MG/3ML) 0.083% IN NEBU
2.5000 mg | INHALATION_SOLUTION | Freq: Once | RESPIRATORY_TRACT | Status: AC
  Administered 2018-05-16: 2.5 mg via RESPIRATORY_TRACT

## 2018-05-16 NOTE — Progress Notes (Addendum)
PCP - Dr. Metro Kung. Moreira  Cardiologist - Dr. Yates DecampJay Ganji  Chest x-ray - 05/16/18  EKG - 05/16/18  Stress Test - Denies  ECHO - 04/22/18 (E)  Cardiac Cath - 04/22/18 (E)  AICD- na PM- na LOOP- na  Sleep Study - Yes- Positive CPAP - None  LABS- 05/16/18: CBC, CMP, PT, PTT, ABG, T/S, UA, PCR  ASA- Denies Xarelto- LD-11/26  HA1C- 05/16/18   Anesthesia- No  Pt denies having chest pain, sob, or fever at this time. All instructions explained to the pt, with a verbal understanding of the material. Pt agrees to go over the instructions while at home for a better understanding. The opportunity to ask questions was provided.

## 2018-05-16 NOTE — Progress Notes (Signed)
[address]       [place] [address]             [phone]     CARDIOTHORACIC SURGERY OFFICE NOTE  Referring Provider is Yates Decamp, MD PCP is Ralene Ok, MD   HPI:  Patient is a 57 year old male with history of coronary artery disease status post inferolateral wall myocardial infarction in 2012 status post multivessel PCI and stenting at that time, mitral regurgitation, hypertension, long-standing persistent atrial fibrillation, obstructive sleep apnea, abdominal aortic aneurysm status post endovascular repair in 2016, and long-standing tobacco abuse who returns to the office today with tentative plans to proceed with mitral valve repair, coronary artery bypass grafting, and Maze procedure this week.  He was originally seen in consultation on April 25, 2018.  After making matters over he made a decision to proceed with surgery.  He underwent CT angiography and returns to the office today.  He stopped taking Xarelto last week in anticipation of his surgery.  He reports no new problems or complaints.   Current Outpatient Medications  Medication Sig Dispense Refill  . Ascorbic Acid (VITAMIN C) 1000 MG tablet Take 1,000 mg by mouth daily.    . digoxin (LANOXIN) 0.25 MG tablet Take 250 mcg by mouth daily.     Marland Kitchen lisinopril (PRINIVIL,ZESTRIL) 40 MG tablet Take 40 mg by mouth daily.     Marland Kitchen lovastatin (MEVACOR) 40 MG tablet Take 40 mg by mouth at bedtime.    . metoprolol (LOPRESSOR) 50 MG tablet Take 2 tablets (100 mg total) by mouth 2 (two) times daily. 30 tablet 3  . Multiple Vitamin (MULTIVITAMIN) capsule Take 1 capsule by mouth daily.    . nitroGLYCERIN (NITROSTAT) 0.4 MG SL tablet Place 0.4 mg under the tongue every 5 (five) minutes as needed for chest pain.    Marland Kitchen omega-3 acid ethyl esters (LOVAZA) 1 g capsule Take 1 g by mouth daily.    . Rivaroxaban (XARELTO) 20 MG TABS tablet Take 20 mg by mouth daily with supper.     . zolpidem (AMBIEN) 10 MG tablet Take 10  mg by mouth at bedtime.      No current facility-administered medications for this visit.       Physical Exam:   BP 118/85   Pulse 65   Resp 20   Ht 6' (1.829 m)   Wt 194 lb (88 kg)   SpO2 99% Comment: RA  BMI 26.31 kg/m   General:  Well-appearing  Chest:   Clear to auscultation  CV:   Irregular rate and rhythm with systolic murmur  Incisions:  n/a  Abdomen:  Soft nontender  Extremities:  Warm and well-perfused  Diagnostic Tests:  CT ANGIOGRAPHY CHEST, ABDOMEN AND PELVIS  TECHNIQUE: Multidetector CT imaging through the chest, abdomen and pelvis was performed using the standard protocol during bolus administration of intravenous contrast. Multiplanar reconstructed images and MIPs were obtained and reviewed to evaluate the vascular anatomy.  CONTRAST:  75mL ISOVUE-370 IOPAMIDOL (ISOVUE-370) INJECTION 76%  COMPARISON:  CT scan of July 08, 2015 and November 26, 2014.  FINDINGS: CTA CHEST FINDINGS  Cardiovascular: Atherosclerosis of thoracic aorta is noted without aneurysm or dissection. Great vessels are widely patent without significant stenosis. Coronary calcifications are noted suggesting coronary artery disease. No pericardial effusion is noted. Severe left atrial enlargement is noted consistent with history of mitral valve insufficiency.  Mediastinum/Nodes: No enlarged mediastinal, hilar, or axillary lymph nodes. Thyroid gland, trachea, and esophagus  demonstrate no significant findings.  Lungs/Pleura: Lungs are clear. No pleural effusion or pneumothorax.  Musculoskeletal: No chest wall abnormality. No acute or significant osseous findings.  Review of the MIP images confirms the above findings.  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 Probable small type 2 endoleak measuring 5 x 3 mm is noted best seen on image number 211 of series 9, most likely supplied  by patent inferior mesenteric artery. Excluded aneurysmal sac measures 4.3 cm which is significantly smaller compared to prior exam.  Celiac: Patent without evidence of aneurysm, dissection, vasculitis or significant stenosis.  SMA: Patent without evidence of aneurysm, dissection, vasculitis or significant stenosis.  Renals: Both renal arteries are patent without evidence of aneurysm, dissection, vasculitis, fibromuscular dysplasia or significant stenosis.  IMA: Patent and appears to be cysts supplying small type 2 endoleak.  Inflow: Atherosclerosis is noted without significant stenosis.  Veins: No obvious venous abnormality within the limitations of this arterial phase study.  Review of the MIP images confirms the above findings.  NON-VASCULAR  Hepatobiliary: No focal liver abnormality is seen. No gallstones, gallbladder wall thickening, or biliary dilatation.  Pancreas: Unremarkable. No pancreatic ductal dilatation or surrounding inflammatory changes.  Spleen: Normal in size without focal abnormality.  Adrenals/Urinary Tract: Adrenal glands are unremarkable. Kidneys are normal, without renal calculi, focal lesion, or hydronephrosis. Bladder is unremarkable.  Stomach/Bowel: The stomach appears normal. There is no evidence of bowel obstruction or inflammation.  Lymphatic: No significant adenopathy is noted.  Reproductive: Stable mild prostatic enlargement is noted.  Other: No abdominal wall hernia or abnormality. No abdominopelvic ascites.  Musculoskeletal: No acute or significant osseous findings.  Review of the MIP images confirms the above findings.  IMPRESSION: No evidence of thoracic aortic aneurysm or dissection is noted.  Severe left atrial enlargement is noted consistent with history of mitral insufficiency.  Coronary artery calcifications are noted suggesting coronary artery disease.  Status post endograft repair of infrarenal  abdominal aortic aneurysm. Small type 2 endoleak is noted measuring 5 mm which most likely supplied by patent inferior mesenteric artery. Excluded aneurysmal sac has maximum measured AP diameter 4.2 cm which is significantly smaller compared to prior exam.  Aortic Atherosclerosis (ICD10-I70.0).   Electronically Signed   By: Lupita Raider, M.D.   On: 05/04/2018 12:27   Impression:  Patient has stage D severe symptomatic mitral regurgitation with long-standing persistent atrial fibrillation and multivessel coronary artery disease.  He describes stable symptoms of exertional shortness of breath and fatigue consistent with chronic combined systolic and diastolic congestive heart failure, New York Heart Association functional class II.  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  I have personally reviewed the patient's recent transthoracic and transesophageal echocardiograms, diagnostic cardiac catheterization, and CT angiogram.  Echocardiograms document the presence of mild to moderate left ventricular systolic dysfunction with hypokinesis of the posterior lateral wall.  Ejection fraction has been estimated between 55 and 60% in the setting of severe mitral regurgitation.  There is severe left atrial chamber enlargement.  Left ventricular size appears close to normal.  Functional anatomy of the mitral valve is probably a combination of primary and secondary disease with a combination of type I dysfunction related to severe annular enlargement and type IIIb dysfunction related to tethering of the posterior leaflet.  This creates an overriding anterior leaflet with a somewhat eccentric jet of regurgitation directed  around the posterior aspect of the left atrium.  There is severe mitral regurgitation with regurgitant volume estimated greater than 60 mL per beat and flow reversal documented in the pulmonary veins.   The patient's mitral regurgitation is continued to progress despite close follow-up with optimal medical therapy.  Patient also has long-standing persistent atrial fibrillation.  Diagnostic cardiac catheterization demonstrates moderate disease in the proximal left anterior descending coronary artery with what was felt to be 50% stenosis just beyond the terminal portion of the previous stent.  This lesion is presumably not flow-limiting but certainly appears angiographically significant.  There is 100% chronic occlusion of the mid right coronary artery.  There is some left-to-right collateral filling of the terminal branch of the distal right coronary system.  However, whether or not the terminal branches of the distal right coronary would be graftable at the time of surgery is somewhat difficult to say.  I agree the patient needs elective mitral valve repair.  He would likely benefit from concomitant Maze procedure.  He may or may not benefit from concomitant coronary artery bypass grafting.  CT angiography reveals patent stent graft in the abdominal aorta and iliac vessels with type II endoleak.  There is moderate atherosclerotic disease in the common and external iliac arteries which might increased risks associated with an attempt at femoral artery cannulation for cardiopulmonary bypass.   Plan:  The patient was again counseled at length regarding the indications, risks and potential benefits of mitral valve repair.  The rationale for elective surgery has been explained, including a comparison between surgery and continued medical therapy with close follow-up.  The likelihood of successful and durable valve repair has been discussed with particular reference to the findings of their recent echocardiogram.  Based upon these findings and previous experience, I have quoted them a greater than 90 percent likelihood of successful valve repair.  The relative risks and benefits of performing a maze procedure at  the time of their surgery  was discussed at length, including the expected likelihood of long term freedom from recurrent symptomatic atrial fibrillation and/or atrial flutter.  Alternative surgical approaches have been discussed including a comparison between conventional sternotomy and minimally-invasive techniques.  The relative risks and benefits of each have been reviewed as they pertain to the patient's specific circumstances, and all of their questions have been addressed.  The relative risks and benefits of concomitant coronary artery bypass grafting was discussed including the fact that conventional median sternotomy would be required if surgical revascularization of the patient's right coronary artery system were to be planned.    We plan to proceed with mitral valve repair, coronary artery bypass grafting, and Maze procedure on Wednesday, May 18, 2018 via conventional median sternotomy. The patient understands and accepts all potential risks of surgery including but not limited to risk of death, stroke or other neurologic complication, myocardial infarction, congestive heart failure, respiratory failure, renal failure, bleeding requiring transfusion and/or reexploration, arrhythmia, infection or other wound complications, pneumonia, pleural and/or pericardial effusion, pulmonary embolus, aortic dissection or other major vascular complication, late recurrence of ischemic heart disease, or delayed complications related to valve repair or replacement including but not limited to structural valve deterioration and failure, thrombosis, embolization, endocarditis, or paravalvular leak.  The relative risks and benefits of performing a maze procedure at the time of his surgery was discussed at length, including the expected likelihood of long term freedom from recurrent symptomatic atrial fibrillation and/or atrial flutter.  The patient additionally provides consent for  long term follow up following surgery  including participation in the [employer] database.    Expectations for the patient's postoperative convalescence have been discussed.    Because the patient lives alone and does not have anybody who can stay with him for a temporary period of time during his recovery, he understands that he will likely need short-term placement in some type of skilled nursing facility for rehabilitation for at least a week or 2 after his initial hospital discharge.  Under the circumstances this will allow [place] at least his early [place].  If he does well he [place] then [place] [facility] where he will need to have someone to monitor his medications including warfarin anticoagulation.  All of his questions have been answered.   I spent in excess of 15 minutes during the conduct of this office consultation and >50% of this time involved direct face-to-face encounter with the patient for counseling and/or coordination of their care.    Salvatore Decentlarence H. Cornelius Moraswen, MD 05/16/2018 4:00 PM

## 2018-05-16 NOTE — Progress Notes (Signed)
Pre-op Cardiac Surgery  Carotid Findings:  1-39% ICA stenosis. Vertebral artery flow is antegrade.  Upper Extremity Right Left  Brachial Pressures 125T 127T  Radial Waveforms T T  Ulnar Waveforms T T  Palmar Arch (Allen's Test) WNL Doppler signal remains normal with radial compression and obliterates with ulnar compression

## 2018-05-16 NOTE — Patient Instructions (Signed)
   Continue taking all current medications without change through the day before surgery.  Have nothing to eat or drink after midnight the night before surgery.  On the morning of surgery take only metoprolol with a sip of water.    

## 2018-05-17 MED ORDER — PHENYLEPHRINE HCL-NACL 20-0.9 MG/250ML-% IV SOLN
30.0000 ug/min | INTRAVENOUS | Status: DC
  Filled 2018-05-17: qty 250

## 2018-05-17 MED ORDER — VANCOMYCIN HCL 10 G IV SOLR
1500.0000 mg | INTRAVENOUS | Status: AC
  Administered 2018-05-18: 1500 mg via INTRAVENOUS
  Filled 2018-05-17: qty 1500

## 2018-05-17 MED ORDER — MAGNESIUM SULFATE 50 % IJ SOLN
40.0000 meq | INTRAMUSCULAR | Status: DC
  Filled 2018-05-17: qty 9.85

## 2018-05-17 MED ORDER — NITROGLYCERIN IN D5W 200-5 MCG/ML-% IV SOLN
2.0000 ug/min | INTRAVENOUS | Status: DC
  Filled 2018-05-17: qty 250

## 2018-05-17 MED ORDER — MILRINONE LACTATE IN DEXTROSE 20-5 MG/100ML-% IV SOLN
0.3000 ug/kg/min | INTRAVENOUS | Status: AC
  Administered 2018-05-18: .2 ug/kg/min via INTRAVENOUS
  Filled 2018-05-17: qty 100

## 2018-05-17 MED ORDER — PLASMA-LYTE 148 IV SOLN
INTRAVENOUS | Status: AC
  Administered 2018-05-18: 500 mL
  Filled 2018-05-17: qty 2.5

## 2018-05-17 MED ORDER — KENNESTONE BLOOD CARDIOPLEGIA (KBC) MANNITOL SYRINGE (20%, 32ML)
32.0000 mL | Freq: Once | INTRAVENOUS | Status: DC
  Filled 2018-05-17: qty 32

## 2018-05-17 MED ORDER — TRANEXAMIC ACID 1000 MG/10ML IV SOLN
1.5000 mg/kg/h | INTRAVENOUS | Status: DC
  Filled 2018-05-17: qty 25

## 2018-05-17 MED ORDER — KENNESTONE BLOOD CARDIOPLEGIA VIAL
13.0000 mL | Freq: Once | Status: DC
  Filled 2018-05-17: qty 13

## 2018-05-17 MED ORDER — GLUTARALDEHYDE 0.625% SOAKING SOLUTION
TOPICAL | Status: DC
  Filled 2018-05-17: qty 50

## 2018-05-17 MED ORDER — INSULIN REGULAR(HUMAN) IN NACL 100-0.9 UT/100ML-% IV SOLN
INTRAVENOUS | Status: DC
  Filled 2018-05-17: qty 100

## 2018-05-17 MED ORDER — SODIUM CHLORIDE 0.9 % IV SOLN
1.5000 g | INTRAVENOUS | Status: AC
  Administered 2018-05-18: .75 g via INTRAVENOUS
  Administered 2018-05-18: 1.5 g via INTRAVENOUS
  Filled 2018-05-17: qty 1.5

## 2018-05-17 MED ORDER — SODIUM CHLORIDE 0.9 % IV SOLN
750.0000 mg | INTRAVENOUS | Status: DC
  Filled 2018-05-17: qty 750

## 2018-05-17 MED ORDER — DEXMEDETOMIDINE HCL IN NACL 400 MCG/100ML IV SOLN
0.1000 ug/kg/h | INTRAVENOUS | Status: DC
  Filled 2018-05-17: qty 100

## 2018-05-17 MED ORDER — TRANEXAMIC ACID (OHS) BOLUS VIA INFUSION
15.0000 mg/kg | INTRAVENOUS | Status: AC
  Administered 2018-05-18: 1320 mg via INTRAVENOUS
  Filled 2018-05-17: qty 1320

## 2018-05-17 MED ORDER — TRANEXAMIC ACID (OHS) PUMP PRIME SOLUTION
2.0000 mg/kg | INTRAVENOUS | Status: DC
  Filled 2018-05-17: qty 1.76

## 2018-05-17 MED ORDER — DOPAMINE-DEXTROSE 3.2-5 MG/ML-% IV SOLN
0.0000 ug/kg/min | INTRAVENOUS | Status: DC
  Filled 2018-05-17: qty 250

## 2018-05-17 MED ORDER — EPINEPHRINE PF 1 MG/ML IJ SOLN
0.0000 ug/min | INTRAVENOUS | Status: DC
  Filled 2018-05-17: qty 4

## 2018-05-17 MED ORDER — POTASSIUM CHLORIDE 2 MEQ/ML IV SOLN
80.0000 meq | INTRAVENOUS | Status: DC
  Filled 2018-05-17: qty 40

## 2018-05-17 MED ORDER — NOREPINEPHRINE 4 MG/250ML-% IV SOLN
0.0000 ug/min | INTRAVENOUS | Status: DC
  Filled 2018-05-17: qty 250

## 2018-05-17 MED ORDER — VANCOMYCIN HCL 1000 MG IV SOLR
INTRAVENOUS | Status: AC
  Administered 2018-05-18: 1000 mL
  Filled 2018-05-17: qty 1000

## 2018-05-17 MED ORDER — SODIUM CHLORIDE 0.9 % IV SOLN
INTRAVENOUS | Status: DC
  Filled 2018-05-17: qty 30

## 2018-05-17 NOTE — Anesthesia Preprocedure Evaluation (Addendum)
Anesthesia Evaluation  Patient identified by MRN, date of birth, ID band Patient awake    Reviewed: Allergy & Precautions, NPO status , Patient's Chart, lab work & pertinent test results  History of Anesthesia Complications Negative for: history of anesthetic complications  Airway Mallampati: III  TM Distance: >3 FB Neck ROM: Full    Dental  (+) Dental Advisory Given   Pulmonary sleep apnea (does not use CPAP) , Current Smoker,    breath sounds clear to auscultation       Cardiovascular hypertension, Pt. on medications and Pt. on home beta blockers (-) angina+ CAD, + Cardiac Stents and + Peripheral Vascular Disease (s/p aortic stent graft)  + dysrhythmias Atrial Fibrillation + Valvular Problems/Murmurs (severe MR) MR  Rhythm:Irregular Rate:Normal + Systolic murmurs 16/06/909/8/19 ECHO: EF 55-60%, severe MR with post leaflet restriction, and overriding anterior leaflet   Neuro/Psych  Headaches,    GI/Hepatic negative GI ROS, Neg liver ROS,   Endo/Other  negative endocrine ROS  Renal/GU negative Renal ROS     Musculoskeletal negative musculoskeletal ROS (+)   Abdominal   Peds  Hematology xarelto   Anesthesia Other Findings   Reproductive/Obstetrics                            Anesthesia Physical Anesthesia Plan  ASA: IV  Anesthesia Plan: General   Post-op Pain Management:    Induction: Intravenous  PONV Risk Score and Plan: 1 and Treatment may vary due to age or medical condition  Airway Management Planned: Oral ETT  Additional Equipment: Arterial line, PA Cath, TEE and Ultrasound Guidance Line Placement  Intra-op Plan:   Post-operative Plan: Post-operative intubation/ventilation  Informed Consent: I have reviewed the patients History and Physical, chart, labs and discussed the procedure including the risks, benefits and alternatives for the proposed anesthesia with the patient or  authorized representative who has indicated his/her understanding and acceptance.   Dental advisory given  Plan Discussed with: CRNA and Surgeon  Anesthesia Plan Comments: (Plan routine monitors, A-line, PA cath, GETA with TEE and post op analgesia)       Anesthesia Quick Evaluation

## 2018-05-18 ENCOUNTER — Encounter (HOSPITAL_COMMUNITY)
Admission: RE | Disposition: A | Discharge status: Home / Self Care | Payer: Self-pay | Source: Home / Self Care | Attending: Thoracic Surgery (Cardiothoracic Vascular Surgery)

## 2018-05-18 ENCOUNTER — Inpatient Hospital Stay (HOSPITAL_COMMUNITY): Payer: BLUE CROSS/BLUE SHIELD | Admitting: Certified Registered Nurse Anesthetist

## 2018-05-18 ENCOUNTER — Inpatient Hospital Stay (HOSPITAL_COMMUNITY): Payer: BLUE CROSS/BLUE SHIELD

## 2018-05-18 ENCOUNTER — Other Ambulatory Visit (HOSPITAL_COMMUNITY): Payer: BLUE CROSS/BLUE SHIELD

## 2018-05-18 ENCOUNTER — Inpatient Hospital Stay (HOSPITAL_COMMUNITY)
Admission: RE | Admit: 2018-05-18 | Discharge: 2018-05-28 | DRG: 220 | Disposition: A | Discharge status: Home / Self Care | Payer: BLUE CROSS/BLUE SHIELD | Attending: Thoracic Surgery (Cardiothoracic Vascular Surgery) | Admitting: Thoracic Surgery (Cardiothoracic Vascular Surgery)

## 2018-05-18 ENCOUNTER — Encounter (HOSPITAL_COMMUNITY): Payer: Self-pay | Admitting: Certified Registered"

## 2018-05-18 ENCOUNTER — Other Ambulatory Visit: Payer: Self-pay

## 2018-05-18 DIAGNOSIS — I081 Rheumatic disorders of both mitral and tricuspid valves: Principal | ICD-10-CM | POA: Diagnosis present

## 2018-05-18 DIAGNOSIS — Z951 Presence of aortocoronary bypass graft: Secondary | ICD-10-CM

## 2018-05-18 DIAGNOSIS — E7800 Pure hypercholesterolemia, unspecified: Secondary | ICD-10-CM | POA: Diagnosis present

## 2018-05-18 DIAGNOSIS — X58XXXA Exposure to other specified factors, initial encounter: Secondary | ICD-10-CM | POA: Diagnosis present

## 2018-05-18 DIAGNOSIS — Z79899 Other long term (current) drug therapy: Secondary | ICD-10-CM

## 2018-05-18 DIAGNOSIS — I484 Atypical atrial flutter: Secondary | ICD-10-CM | POA: Diagnosis not present

## 2018-05-18 DIAGNOSIS — Z955 Presence of coronary angioplasty implant and graft: Secondary | ICD-10-CM | POA: Diagnosis not present

## 2018-05-18 DIAGNOSIS — E8770 Fluid overload, unspecified: Secondary | ICD-10-CM | POA: Diagnosis not present

## 2018-05-18 DIAGNOSIS — Z809 Family history of malignant neoplasm, unspecified: Secondary | ICD-10-CM | POA: Diagnosis not present

## 2018-05-18 DIAGNOSIS — G4733 Obstructive sleep apnea (adult) (pediatric): Secondary | ICD-10-CM | POA: Diagnosis present

## 2018-05-18 DIAGNOSIS — J95811 Postprocedural pneumothorax: Secondary | ICD-10-CM | POA: Diagnosis not present

## 2018-05-18 DIAGNOSIS — Z9689 Presence of other specified functional implants: Secondary | ICD-10-CM

## 2018-05-18 DIAGNOSIS — D62 Acute posthemorrhagic anemia: Secondary | ICD-10-CM | POA: Diagnosis not present

## 2018-05-18 DIAGNOSIS — Z8249 Family history of ischemic heart disease and other diseases of the circulatory system: Secondary | ICD-10-CM

## 2018-05-18 DIAGNOSIS — I4811 Longstanding persistent atrial fibrillation: Secondary | ICD-10-CM | POA: Diagnosis present

## 2018-05-18 DIAGNOSIS — E871 Hypo-osmolality and hyponatremia: Secondary | ICD-10-CM | POA: Diagnosis not present

## 2018-05-18 DIAGNOSIS — G8929 Other chronic pain: Secondary | ICD-10-CM | POA: Diagnosis present

## 2018-05-18 DIAGNOSIS — J9811 Atelectasis: Secondary | ICD-10-CM

## 2018-05-18 DIAGNOSIS — I10 Essential (primary) hypertension: Secondary | ICD-10-CM | POA: Diagnosis present

## 2018-05-18 DIAGNOSIS — Z7901 Long term (current) use of anticoagulants: Secondary | ICD-10-CM | POA: Diagnosis not present

## 2018-05-18 DIAGNOSIS — J939 Pneumothorax, unspecified: Secondary | ICD-10-CM

## 2018-05-18 DIAGNOSIS — T797XXA Traumatic subcutaneous emphysema, initial encounter: Secondary | ICD-10-CM | POA: Diagnosis not present

## 2018-05-18 DIAGNOSIS — I272 Pulmonary hypertension, unspecified: Secondary | ICD-10-CM | POA: Diagnosis present

## 2018-05-18 DIAGNOSIS — I340 Nonrheumatic mitral (valve) insufficiency: Secondary | ICD-10-CM

## 2018-05-18 DIAGNOSIS — R011 Cardiac murmur, unspecified: Secondary | ICD-10-CM | POA: Diagnosis present

## 2018-05-18 DIAGNOSIS — D72829 Elevated white blood cell count, unspecified: Secondary | ICD-10-CM | POA: Diagnosis not present

## 2018-05-18 DIAGNOSIS — Z09 Encounter for follow-up examination after completed treatment for conditions other than malignant neoplasm: Secondary | ICD-10-CM

## 2018-05-18 DIAGNOSIS — D696 Thrombocytopenia, unspecified: Secondary | ICD-10-CM | POA: Diagnosis not present

## 2018-05-18 DIAGNOSIS — Z8679 Personal history of other diseases of the circulatory system: Secondary | ICD-10-CM

## 2018-05-18 DIAGNOSIS — J9382 Other air leak: Secondary | ICD-10-CM | POA: Diagnosis not present

## 2018-05-18 DIAGNOSIS — I252 Old myocardial infarction: Secondary | ICD-10-CM | POA: Diagnosis not present

## 2018-05-18 DIAGNOSIS — Z9889 Other specified postprocedural states: Secondary | ICD-10-CM

## 2018-05-18 DIAGNOSIS — I4819 Other persistent atrial fibrillation: Secondary | ICD-10-CM | POA: Diagnosis not present

## 2018-05-18 DIAGNOSIS — F17210 Nicotine dependence, cigarettes, uncomplicated: Secondary | ICD-10-CM | POA: Diagnosis present

## 2018-05-18 DIAGNOSIS — E878 Other disorders of electrolyte and fluid balance, not elsewhere classified: Secondary | ICD-10-CM | POA: Diagnosis not present

## 2018-05-18 DIAGNOSIS — I2510 Atherosclerotic heart disease of native coronary artery without angina pectoris: Secondary | ICD-10-CM | POA: Diagnosis present

## 2018-05-18 DIAGNOSIS — Z833 Family history of diabetes mellitus: Secondary | ICD-10-CM

## 2018-05-18 DIAGNOSIS — Z4682 Encounter for fitting and adjustment of non-vascular catheter: Secondary | ICD-10-CM

## 2018-05-18 DIAGNOSIS — E785 Hyperlipidemia, unspecified: Secondary | ICD-10-CM | POA: Diagnosis present

## 2018-05-18 HISTORY — PX: TEE WITHOUT CARDIOVERSION: SHX5443

## 2018-05-18 HISTORY — DX: Personal history of other diseases of the circulatory system: Z86.79

## 2018-05-18 HISTORY — DX: Presence of aortocoronary bypass graft: Z95.1

## 2018-05-18 HISTORY — DX: Other specified postprocedural states: Z98.890

## 2018-05-18 HISTORY — PX: CORONARY ARTERY BYPASS GRAFT: SHX141

## 2018-05-18 HISTORY — PX: MITRAL VALVE REPAIR: SHX2039

## 2018-05-18 HISTORY — PX: MAZE: SHX5063

## 2018-05-18 LAB — POCT I-STAT 3, ART BLOOD GAS (G3+)
ACID-BASE DEFICIT: 7 mmol/L — AB (ref 0.0–2.0)
Acid-base deficit: 2 mmol/L (ref 0.0–2.0)
Acid-base deficit: 3 mmol/L — ABNORMAL HIGH (ref 0.0–2.0)
Acid-base deficit: 5 mmol/L — ABNORMAL HIGH (ref 0.0–2.0)
Acid-base deficit: 6 mmol/L — ABNORMAL HIGH (ref 0.0–2.0)
Acid-base deficit: 7 mmol/L — ABNORMAL HIGH (ref 0.0–2.0)
Bicarbonate: 24.7 mmol/L (ref 20.0–28.0)
Bicarbonate: 24.2 mmol/L (ref 20.0–28.0)
Bicarbonate: 20.5 mmol/L (ref 20.0–28.0)
Bicarbonate: 19.5 mmol/L — ABNORMAL LOW (ref 20.0–28.0)
Bicarbonate: 19.4 mmol/L — ABNORMAL LOW (ref 20.0–28.0)
Bicarbonate: 18.1 mmol/L — ABNORMAL LOW (ref 20.0–28.0)
O2 Saturation: 100 %
O2 Saturation: 100 %
O2 Saturation: 92 %
O2 Saturation: 95 %
O2 Saturation: 94 %
O2 Saturation: 94 %
PCO2 ART: 37.3 mmHg (ref 32.0–48.0)
Patient temperature: 36.6
Patient temperature: 36
Patient temperature: 37.2
Patient temperature: 37.3
TCO2: 26 mmol/L (ref 22–32)
TCO2: 26 mmol/L (ref 22–32)
TCO2: 22 mmol/L (ref 22–32)
TCO2: 20 mmol/L — ABNORMAL LOW (ref 22–32)
TCO2: 21 mmol/L — ABNORMAL LOW (ref 22–32)
TCO2: 19 mmol/L — AB (ref 22–32)
pCO2 arterial: 50.1 mmHg — ABNORMAL HIGH (ref 32.0–48.0)
pCO2 arterial: 51.6 mmHg — ABNORMAL HIGH (ref 32.0–48.0)
pCO2 arterial: 48.5 mmHg — ABNORMAL HIGH (ref 32.0–48.0)
pCO2 arterial: 31.4 mmHg — ABNORMAL LOW (ref 32.0–48.0)
pCO2 arterial: 33.6 mmHg (ref 32.0–48.0)
pH, Arterial: 7.301 — ABNORMAL LOW (ref 7.350–7.450)
pH, Arterial: 7.278 — ABNORMAL LOW (ref 7.350–7.450)
pH, Arterial: 7.233 — ABNORMAL LOW (ref 7.350–7.450)
pH, Arterial: 7.397 (ref 7.350–7.450)
pH, Arterial: 7.326 — ABNORMAL LOW (ref 7.350–7.450)
pH, Arterial: 7.342 — ABNORMAL LOW (ref 7.350–7.450)
pO2, Arterial: 358 mmHg — ABNORMAL HIGH (ref 83.0–108.0)
pO2, Arterial: 261 mmHg — ABNORMAL HIGH (ref 83.0–108.0)
pO2, Arterial: 74 mmHg — ABNORMAL LOW (ref 83.0–108.0)
pO2, Arterial: 73 mmHg — ABNORMAL LOW (ref 83.0–108.0)
pO2, Arterial: 78 mmHg — ABNORMAL LOW (ref 83.0–108.0)
pO2, Arterial: 76 mmHg — ABNORMAL LOW (ref 83.0–108.0)

## 2018-05-18 LAB — CBC
HCT: 38 % — ABNORMAL LOW (ref 39.0–52.0)
HEMATOCRIT: 45 % (ref 39.0–52.0)
HEMOGLOBIN: 14.7 g/dL (ref 13.0–17.0)
Hemoglobin: 12.6 g/dL — ABNORMAL LOW (ref 13.0–17.0)
MCH: 29.6 pg (ref 26.0–34.0)
MCH: 29.7 pg (ref 26.0–34.0)
MCHC: 32.7 g/dL (ref 30.0–36.0)
MCHC: 33.2 g/dL (ref 30.0–36.0)
MCV: 90.5 fL (ref 80.0–100.0)
MCV: 89.6 fL (ref 80.0–100.0)
Platelets: 108 10*3/uL — ABNORMAL LOW (ref 150–400)
Platelets: 92 10*3/uL — ABNORMAL LOW (ref 150–400)
RBC: 4.97 MIL/uL (ref 4.22–5.81)
RBC: 4.24 MIL/uL (ref 4.22–5.81)
RDW: 13.2 % (ref 11.5–15.5)
RDW: 13.2 % (ref 11.5–15.5)
WBC: 16.5 10*3/uL — ABNORMAL HIGH (ref 4.0–10.5)
WBC: 13.4 10*3/uL — ABNORMAL HIGH (ref 4.0–10.5)
nRBC: 0 % (ref 0.0–0.2)
nRBC: 0 % (ref 0.0–0.2)

## 2018-05-18 LAB — POCT I-STAT, CHEM 8
BUN: 9 mg/dL (ref 6–20)
BUN: 10 mg/dL (ref 6–20)
BUN: 8 mg/dL (ref 6–20)
BUN: 8 mg/dL (ref 6–20)
BUN: 8 mg/dL (ref 6–20)
BUN: 8 mg/dL (ref 6–20)
BUN: 8 mg/dL (ref 6–20)
CHLORIDE: 101 mmol/L (ref 98–111)
CREATININE: 0.5 mg/dL — AB (ref 0.61–1.24)
Calcium, Ion: 1.19 mmol/L (ref 1.15–1.40)
Calcium, Ion: 1.23 mmol/L (ref 1.15–1.40)
Calcium, Ion: 1.05 mmol/L — ABNORMAL LOW (ref 1.15–1.40)
Calcium, Ion: 1.13 mmol/L — ABNORMAL LOW (ref 1.15–1.40)
Calcium, Ion: 1.13 mmol/L — ABNORMAL LOW (ref 1.15–1.40)
Calcium, Ion: 1.14 mmol/L — ABNORMAL LOW (ref 1.15–1.40)
Calcium, Ion: 1.08 mmol/L — ABNORMAL LOW (ref 1.15–1.40)
Chloride: 103 mmol/L (ref 98–111)
Chloride: 102 mmol/L (ref 98–111)
Chloride: 103 mmol/L (ref 98–111)
Chloride: 103 mmol/L (ref 98–111)
Chloride: 105 mmol/L (ref 98–111)
Chloride: 107 mmol/L (ref 98–111)
Creatinine, Ser: 0.5 mg/dL — ABNORMAL LOW (ref 0.61–1.24)
Creatinine, Ser: 0.5 mg/dL — ABNORMAL LOW (ref 0.61–1.24)
Creatinine, Ser: 0.5 mg/dL — ABNORMAL LOW (ref 0.61–1.24)
Creatinine, Ser: 0.4 mg/dL — ABNORMAL LOW (ref 0.61–1.24)
Creatinine, Ser: 0.5 mg/dL — ABNORMAL LOW (ref 0.61–1.24)
Creatinine, Ser: 0.4 mg/dL — ABNORMAL LOW (ref 0.61–1.24)
Glucose, Bld: 100 mg/dL — ABNORMAL HIGH (ref 70–99)
Glucose, Bld: 110 mg/dL — ABNORMAL HIGH (ref 70–99)
Glucose, Bld: 112 mg/dL — ABNORMAL HIGH (ref 70–99)
Glucose, Bld: 121 mg/dL — ABNORMAL HIGH (ref 70–99)
Glucose, Bld: 124 mg/dL — ABNORMAL HIGH (ref 70–99)
Glucose, Bld: 104 mg/dL — ABNORMAL HIGH (ref 70–99)
Glucose, Bld: 107 mg/dL — ABNORMAL HIGH (ref 70–99)
HCT: 38 % — ABNORMAL LOW (ref 39.0–52.0)
HCT: 40 % (ref 39.0–52.0)
HCT: 35 % — ABNORMAL LOW (ref 39.0–52.0)
HCT: 31 % — ABNORMAL LOW (ref 39.0–52.0)
HCT: 33 % — ABNORMAL LOW (ref 39.0–52.0)
HCT: 34 % — ABNORMAL LOW (ref 39.0–52.0)
HCT: 39 % (ref 39.0–52.0)
HEMOGLOBIN: 12.9 g/dL — AB (ref 13.0–17.0)
HEMOGLOBIN: 13.6 g/dL (ref 13.0–17.0)
HEMOGLOBIN: 11.6 g/dL — AB (ref 13.0–17.0)
Hemoglobin: 11.9 g/dL — ABNORMAL LOW (ref 13.0–17.0)
Hemoglobin: 10.5 g/dL — ABNORMAL LOW (ref 13.0–17.0)
Hemoglobin: 11.2 g/dL — ABNORMAL LOW (ref 13.0–17.0)
Hemoglobin: 13.3 g/dL (ref 13.0–17.0)
POTASSIUM: 3.9 mmol/L (ref 3.5–5.1)
Potassium: 3.8 mmol/L (ref 3.5–5.1)
Potassium: 3.9 mmol/L (ref 3.5–5.1)
Potassium: 4 mmol/L (ref 3.5–5.1)
Potassium: 4.1 mmol/L (ref 3.5–5.1)
Potassium: 4.2 mmol/L (ref 3.5–5.1)
Potassium: 4.2 mmol/L (ref 3.5–5.1)
Sodium: 136 mmol/L (ref 135–145)
Sodium: 137 mmol/L (ref 135–145)
Sodium: 137 mmol/L (ref 135–145)
Sodium: 136 mmol/L (ref 135–145)
Sodium: 136 mmol/L (ref 135–145)
Sodium: 137 mmol/L (ref 135–145)
Sodium: 138 mmol/L (ref 135–145)
TCO2: 26 mmol/L (ref 22–32)
TCO2: 27 mmol/L (ref 22–32)
TCO2: 27 mmol/L (ref 22–32)
TCO2: 27 mmol/L (ref 22–32)
TCO2: 28 mmol/L (ref 22–32)
TCO2: 27 mmol/L (ref 22–32)
TCO2: 22 mmol/L (ref 22–32)

## 2018-05-18 LAB — CREATININE, SERUM
Creatinine, Ser: 0.61 mg/dL (ref 0.61–1.24)
GFR calc Af Amer: >60 mL/min (ref >60)
GFR calc non Af Amer: >60 mL/min (ref >60)

## 2018-05-18 LAB — MAGNESIUM: Magnesium: 2.6 mg/dL — ABNORMAL HIGH (ref 1.7–2.4)

## 2018-05-18 LAB — GLUCOSE, CAPILLARY
GLUCOSE-CAPILLARY: 161 mg/dL — AB (ref 70–99)
Glucose-Capillary: 111 mg/dL — ABNORMAL HIGH (ref 70–99)
Glucose-Capillary: 113 mg/dL — ABNORMAL HIGH (ref 70–99)
Glucose-Capillary: 122 mg/dL — ABNORMAL HIGH (ref 70–99)
Glucose-Capillary: 61 mg/dL — ABNORMAL LOW (ref 70–99)
Glucose-Capillary: 85 mg/dL (ref 70–99)

## 2018-05-18 LAB — HEMOGLOBIN AND HEMATOCRIT, BLOOD
HEMATOCRIT: 31.5 % — AB (ref 39.0–52.0)
HEMOGLOBIN: 10.4 g/dL — AB (ref 13.0–17.0)

## 2018-05-18 LAB — APTT: aPTT: 45 seconds — ABNORMAL HIGH (ref 24–36)

## 2018-05-18 LAB — PROTIME-INR
INR: 1.53
Prothrombin Time: 18.2 seconds — ABNORMAL HIGH (ref 11.4–15.2)

## 2018-05-18 LAB — PLATELET COUNT: Platelets: 116 10*3/uL — ABNORMAL LOW (ref 150–400)

## 2018-05-18 SURGERY — REPAIR, MITRAL VALVE
Anesthesia: General | Site: Chest

## 2018-05-18 MED ORDER — ARTIFICIAL TEARS OPHTHALMIC OINT
TOPICAL_OINTMENT | OPHTHALMIC | Status: AC
  Filled 2018-05-18: qty 3.5

## 2018-05-18 MED ORDER — ONDANSETRON HCL 4 MG/2ML IJ SOLN
INTRAMUSCULAR | Status: AC
  Filled 2018-05-18: qty 2

## 2018-05-18 MED ORDER — SODIUM CHLORIDE 0.9 % IV SOLN: INTRAVENOUS | Status: DC

## 2018-05-18 MED ORDER — SODIUM CHLORIDE 0.9 % IV SOLN
INTRAVENOUS | Status: DC | PRN
  Administered 2018-05-18: 13:00:00 via INTRAVENOUS

## 2018-05-18 MED ORDER — TRANEXAMIC ACID 1000 MG/10ML IV SOLN
INTRAVENOUS | Status: DC | PRN
  Administered 2018-05-18: 1.5 mg/kg/h via INTRAVENOUS

## 2018-05-18 MED ORDER — ROCURONIUM BROMIDE 50 MG/5ML IV SOSY
PREFILLED_SYRINGE | INTRAVENOUS | Status: AC
  Filled 2018-05-18: qty 10

## 2018-05-18 MED ORDER — SODIUM CHLORIDE 0.45 % IV SOLN: INTRAVENOUS | Status: DC | PRN

## 2018-05-18 MED ORDER — ALBUMIN HUMAN 5 % IV SOLN
INTRAVENOUS | Status: DC | PRN
  Administered 2018-05-18: 15:00:00 via INTRAVENOUS

## 2018-05-18 MED ORDER — ASPIRIN 81 MG PO CHEW: 324.0000 mg | CHEWABLE_TABLET | Freq: Every day | ORAL | Status: DC

## 2018-05-18 MED ORDER — SODIUM CHLORIDE 0.9% FLUSH
3.0000 mL | INTRAVENOUS | Status: DC | PRN
  Administered 2018-05-19: 3 mL via INTRAVENOUS
  Filled 2018-05-18: qty 3

## 2018-05-18 MED ORDER — SODIUM CHLORIDE 0.9 % IV SOLN: 250.0000 mL | INTRAVENOUS | Status: DC

## 2018-05-18 MED ORDER — LACTATED RINGERS IV SOLN
INTRAVENOUS | Status: DC
  Administered 2018-05-19: 13:00:00 via INTRAVENOUS

## 2018-05-18 MED ORDER — INSULIN REGULAR(HUMAN) IN NACL 100-0.9 UT/100ML-% IV SOLN: INTRAVENOUS | Status: DC

## 2018-05-18 MED ORDER — OXYCODONE HCL 5 MG PO TABS
5.0000 mg | ORAL_TABLET | ORAL | Status: DC | PRN
  Administered 2018-05-18: 10 mg via ORAL
  Administered 2018-05-19 (×2): 5 mg via ORAL
  Administered 2018-05-19: 10 mg via ORAL
  Administered 2018-05-19: 5 mg via ORAL
  Administered 2018-05-19 – 2018-05-20 (×3): 10 mg via ORAL
  Administered 2018-05-20 (×2): 5 mg via ORAL
  Administered 2018-05-20: 10 mg via ORAL
  Administered 2018-05-20 – 2018-05-21 (×4): 5 mg via ORAL
  Administered 2018-05-22: 10 mg via ORAL
  Administered 2018-05-22 (×2): 5 mg via ORAL
  Administered 2018-05-22 – 2018-05-23 (×2): 10 mg via ORAL
  Administered 2018-05-23: 5 mg via ORAL
  Administered 2018-05-23 – 2018-05-27 (×21): 10 mg via ORAL
  Administered 2018-05-27: 5 mg via ORAL
  Administered 2018-05-27: 10 mg via ORAL
  Administered 2018-05-27: 5 mg via ORAL
  Administered 2018-05-28 (×4): 10 mg via ORAL
  Filled 2018-05-18: qty 1
  Filled 2018-05-18 (×5): qty 2
  Filled 2018-05-18 (×2): qty 1
  Filled 2018-05-18 (×4): qty 2
  Filled 2018-05-18 (×2): qty 1
  Filled 2018-05-18 (×2): qty 2
  Filled 2018-05-18: qty 1
  Filled 2018-05-18: qty 2
  Filled 2018-05-18: qty 1
  Filled 2018-05-18 (×7): qty 2
  Filled 2018-05-18 (×3): qty 1
  Filled 2018-05-18: qty 2
  Filled 2018-05-18: qty 1
  Filled 2018-05-18 (×7): qty 2
  Filled 2018-05-18 (×3): qty 1
  Filled 2018-05-18 (×7): qty 2
  Filled 2018-05-18: qty 1
  Filled 2018-05-18: qty 2
  Filled 2018-05-18 (×2): qty 1

## 2018-05-18 MED ORDER — LACTATED RINGERS IV SOLN
INTRAVENOUS | Status: DC | PRN
  Administered 2018-05-18 (×2): via INTRAVENOUS

## 2018-05-18 MED ORDER — FENTANYL CITRATE (PF) 250 MCG/5ML IJ SOLN
INTRAMUSCULAR | Status: AC
  Filled 2018-05-18: qty 5

## 2018-05-18 MED ORDER — PROTAMINE SULFATE 10 MG/ML IV SOLN
INTRAVENOUS | Status: AC
  Filled 2018-05-18: qty 25

## 2018-05-18 MED ORDER — MIDAZOLAM HCL (PF) 10 MG/2ML IJ SOLN
INTRAMUSCULAR | Status: AC
  Filled 2018-05-18: qty 2

## 2018-05-18 MED ORDER — PHENYLEPHRINE 40 MCG/ML (10ML) SYRINGE FOR IV PUSH (FOR BLOOD PRESSURE SUPPORT)
PREFILLED_SYRINGE | INTRAVENOUS | Status: DC | PRN
  Administered 2018-05-18 (×2): 80 ug via INTRAVENOUS
  Administered 2018-05-18: 40 ug via INTRAVENOUS

## 2018-05-18 MED ORDER — DOCUSATE SODIUM 100 MG PO CAPS
200.0000 mg | ORAL_CAPSULE | Freq: Every day | ORAL | Status: DC
  Administered 2018-05-19 – 2018-05-27 (×8): 200 mg via ORAL
  Filled 2018-05-18 (×8): qty 2

## 2018-05-18 MED ORDER — INSULIN REGULAR BOLUS VIA INFUSION
0.0000 [IU] | Freq: Three times a day (TID) | INTRAVENOUS | Status: DC
  Filled 2018-05-18: qty 10

## 2018-05-18 MED ORDER — LACTATED RINGERS IV SOLN
INTRAVENOUS | Status: DC | PRN
  Administered 2018-05-18 (×2): via INTRAVENOUS

## 2018-05-18 MED ORDER — TRAMADOL HCL 50 MG PO TABS
50.0000 mg | ORAL_TABLET | ORAL | Status: DC | PRN
  Administered 2018-05-19 – 2018-05-21 (×5): 100 mg via ORAL
  Administered 2018-05-22: 50 mg via ORAL
  Administered 2018-05-22 – 2018-05-23 (×5): 100 mg via ORAL
  Filled 2018-05-18 (×6): qty 2
  Filled 2018-05-18: qty 1
  Filled 2018-05-18 (×4): qty 2

## 2018-05-18 MED ORDER — CHLORHEXIDINE GLUCONATE 0.12 % MT SOLN
15.0000 mL | OROMUCOSAL | Status: AC
  Administered 2018-05-18: 15 mL via OROMUCOSAL

## 2018-05-18 MED ORDER — MIDAZOLAM HCL 5 MG/5ML IJ SOLN
INTRAMUSCULAR | Status: DC | PRN
  Administered 2018-05-18: 3 mg via INTRAVENOUS
  Administered 2018-05-18: 2 mg via INTRAVENOUS
  Administered 2018-05-18: 4 mg via INTRAVENOUS
  Administered 2018-05-18: 2 mg via INTRAVENOUS
  Administered 2018-05-18: 1 mg via INTRAVENOUS

## 2018-05-18 MED ORDER — DEXTROSE 50 % IV SOLN
INTRAVENOUS | Status: AC
  Administered 2018-05-18: 25 mL
  Filled 2018-05-18: qty 50

## 2018-05-18 MED ORDER — SODIUM CHLORIDE 0.9 % IV SOLN
INTRAVENOUS | Status: DC | PRN
  Administered 2018-05-18: 40 ug/min via INTRAVENOUS

## 2018-05-18 MED ORDER — BISACODYL 10 MG RE SUPP: 10.0000 mg | Freq: Every day | RECTAL | Status: DC

## 2018-05-18 MED ORDER — SODIUM CHLORIDE 0.9 % IV SOLN
INTRAVENOUS | Status: DC
  Administered 2018-05-18: 15:00:00 via INTRAVENOUS

## 2018-05-18 MED ORDER — VANCOMYCIN HCL IN DEXTROSE 1-5 GM/200ML-% IV SOLN
1000.0000 mg | Freq: Once | INTRAVENOUS | Status: AC
  Administered 2018-05-18: 1000 mg via INTRAVENOUS
  Filled 2018-05-18: qty 200

## 2018-05-18 MED ORDER — CHLORHEXIDINE GLUCONATE 0.12 % MT SOLN
OROMUCOSAL | Status: AC
  Administered 2018-05-18: 15 mL via OROMUCOSAL
  Filled 2018-05-18: qty 15

## 2018-05-18 MED ORDER — SODIUM CHLORIDE 0.9% FLUSH
3.0000 mL | Freq: Two times a day (BID) | INTRAVENOUS | Status: DC
  Administered 2018-05-19 – 2018-05-21 (×3): 3 mL via INTRAVENOUS

## 2018-05-18 MED ORDER — BISACODYL 5 MG PO TBEC
10.0000 mg | DELAYED_RELEASE_TABLET | Freq: Every day | ORAL | Status: DC
  Administered 2018-05-19 – 2018-05-27 (×5): 10 mg via ORAL
  Filled 2018-05-18 (×8): qty 2

## 2018-05-18 MED ORDER — ASPIRIN EC 325 MG PO TBEC
325.0000 mg | DELAYED_RELEASE_TABLET | Freq: Every day | ORAL | Status: DC
  Administered 2018-05-19: 325 mg via ORAL
  Filled 2018-05-18: qty 1

## 2018-05-18 MED ORDER — FAMOTIDINE IN NACL 20-0.9 MG/50ML-% IV SOLN
20.0000 mg | Freq: Two times a day (BID) | INTRAVENOUS | Status: DC
  Administered 2018-05-18: 20 mg via INTRAVENOUS

## 2018-05-18 MED ORDER — PHENYLEPHRINE 40 MCG/ML (10ML) SYRINGE FOR IV PUSH (FOR BLOOD PRESSURE SUPPORT)
PREFILLED_SYRINGE | INTRAVENOUS | Status: AC
  Filled 2018-05-18: qty 10

## 2018-05-18 MED ORDER — PANTOPRAZOLE SODIUM 40 MG PO TBEC
40.0000 mg | DELAYED_RELEASE_TABLET | Freq: Every day | ORAL | Status: DC
  Administered 2018-05-20 – 2018-05-28 (×8): 40 mg via ORAL
  Filled 2018-05-18 (×9): qty 1

## 2018-05-18 MED ORDER — ACETAMINOPHEN 160 MG/5ML PO SOLN: 1000.0000 mg | Freq: Four times a day (QID) | ORAL | Status: DC

## 2018-05-18 MED ORDER — METOPROLOL TARTRATE 5 MG/5ML IV SOLN: 2.5000 mg | INTRAVENOUS | Status: DC | PRN

## 2018-05-18 MED ORDER — CHLORHEXIDINE GLUCONATE 0.12 % MT SOLN
15.0000 mL | Freq: Once | OROMUCOSAL | Status: AC
  Administered 2018-05-18: 15 mL via OROMUCOSAL

## 2018-05-18 MED ORDER — ROCURONIUM BROMIDE 50 MG/5ML IV SOSY
PREFILLED_SYRINGE | INTRAVENOUS | Status: AC
  Filled 2018-05-18: qty 5

## 2018-05-18 MED ORDER — MIDAZOLAM HCL 2 MG/2ML IJ SOLN: 2.0000 mg | INTRAMUSCULAR | Status: DC | PRN

## 2018-05-18 MED ORDER — ACETAMINOPHEN 500 MG PO TABS
1000.0000 mg | ORAL_TABLET | Freq: Four times a day (QID) | ORAL | Status: AC
  Administered 2018-05-18 – 2018-05-23 (×19): 1000 mg via ORAL
  Filled 2018-05-18 (×20): qty 2

## 2018-05-18 MED ORDER — LIDOCAINE HCL (PF) 1 % IJ SOLN
INTRAMUSCULAR | Status: AC
  Filled 2018-05-18: qty 5

## 2018-05-18 MED ORDER — SODIUM CHLORIDE (PF) 0.9 % IJ SOLN
OROMUCOSAL | Status: DC | PRN
  Administered 2018-05-18 (×4): 4 mL via TOPICAL

## 2018-05-18 MED ORDER — MAGNESIUM SULFATE 4 GM/100ML IV SOLN
4.0000 g | Freq: Once | INTRAVENOUS | Status: AC
  Administered 2018-05-18: 4 g via INTRAVENOUS
  Filled 2018-05-18: qty 100

## 2018-05-18 MED ORDER — LACTATED RINGERS IV SOLN
500.0000 mL | Freq: Once | INTRAVENOUS | Status: AC | PRN
  Administered 2018-05-18: 500 mL via INTRAVENOUS

## 2018-05-18 MED ORDER — MILRINONE LACTATE IN DEXTROSE 20-5 MG/100ML-% IV SOLN
0.0000 ug/kg/min | INTRAVENOUS | Status: DC
  Administered 2018-05-19: 0.2 ug/kg/min via INTRAVENOUS
  Administered 2018-05-19: 0.3 ug/kg/min via INTRAVENOUS
  Filled 2018-05-18 (×2): qty 100

## 2018-05-18 MED ORDER — PROPOFOL 10 MG/ML IV BOLUS
INTRAVENOUS | Status: AC
  Filled 2018-05-18: qty 20

## 2018-05-18 MED ORDER — SODIUM CHLORIDE 0.9 % IV SOLN
INTRAVENOUS | Status: DC | PRN
  Administered 2018-05-18: 0.4 ug/kg/h via INTRAVENOUS

## 2018-05-18 MED ORDER — DEXMEDETOMIDINE HCL IN NACL 200 MCG/50ML IV SOLN
0.0000 ug/kg/h | INTRAVENOUS | Status: DC
  Administered 2018-05-18: 0.5 ug/kg/h via INTRAVENOUS
  Filled 2018-05-18: qty 50

## 2018-05-18 MED ORDER — ONDANSETRON HCL 4 MG/2ML IJ SOLN
INTRAMUSCULAR | Status: DC | PRN
  Administered 2018-05-18: 4 mg via INTRAVENOUS

## 2018-05-18 MED ORDER — ONDANSETRON HCL 4 MG/2ML IJ SOLN: 4.0000 mg | Freq: Four times a day (QID) | INTRAMUSCULAR | Status: DC | PRN

## 2018-05-18 MED ORDER — SODIUM BICARBONATE 8.4 % IV SOLN
50.0000 meq | Freq: Once | INTRAVENOUS | Status: AC
  Administered 2018-05-18: 50 meq via INTRAVENOUS

## 2018-05-18 MED ORDER — ROCURONIUM BROMIDE 50 MG/5ML IV SOSY
PREFILLED_SYRINGE | INTRAVENOUS | Status: DC | PRN
  Administered 2018-05-18: 50 mg via INTRAVENOUS
  Administered 2018-05-18: 100 mg via INTRAVENOUS
  Administered 2018-05-18 (×2): 50 mg via INTRAVENOUS

## 2018-05-18 MED ORDER — SODIUM CHLORIDE 0.9 % IV SOLN
INTRAVENOUS | Status: DC | PRN
  Administered 2018-05-18: .8 [IU]/h via INTRAVENOUS

## 2018-05-18 MED ORDER — ARTIFICIAL TEARS OPHTHALMIC OINT
TOPICAL_OINTMENT | OPHTHALMIC | Status: DC | PRN
  Administered 2018-05-18: 1 via OPHTHALMIC

## 2018-05-18 MED ORDER — INSULIN ASPART 100 UNIT/ML ~~LOC~~ SOLN
0.0000 [IU] | SUBCUTANEOUS | Status: DC
  Administered 2018-05-18 – 2018-05-19 (×2): 2 [IU] via SUBCUTANEOUS

## 2018-05-18 MED ORDER — MIDAZOLAM HCL 2 MG/2ML IJ SOLN
INTRAMUSCULAR | Status: AC
  Filled 2018-05-18: qty 2

## 2018-05-18 MED ORDER — ALBUMIN HUMAN 5 % IV SOLN
250.0000 mL | INTRAVENOUS | Status: DC | PRN
  Administered 2018-05-18 (×2): 12.5 g via INTRAVENOUS
  Filled 2018-05-18: qty 500

## 2018-05-18 MED ORDER — ACETAMINOPHEN 650 MG RE SUPP
650.0000 mg | Freq: Once | RECTAL | Status: AC
  Administered 2018-05-18: 650 mg via RECTAL

## 2018-05-18 MED ORDER — LACTATED RINGERS IV SOLN: INTRAVENOUS | Status: DC

## 2018-05-18 MED ORDER — CHLORHEXIDINE GLUCONATE 4 % EX LIQD: 30.0000 mL | CUTANEOUS | Status: DC

## 2018-05-18 MED ORDER — POTASSIUM CHLORIDE 10 MEQ/50ML IV SOLN: 10.0000 meq | INTRAVENOUS | Status: AC

## 2018-05-18 MED ORDER — HEPARIN SODIUM (PORCINE) 1000 UNIT/ML IJ SOLN
INTRAMUSCULAR | Status: DC | PRN
  Administered 2018-05-18: 30 mL via INTRAVENOUS

## 2018-05-18 MED ORDER — PROTAMINE SULFATE 10 MG/ML IV SOLN
INTRAVENOUS | Status: DC | PRN
  Administered 2018-05-18: 100 mg via INTRAVENOUS
  Administered 2018-05-18: 20 mg via INTRAVENOUS
  Administered 2018-05-18: 30 mg via INTRAVENOUS
  Administered 2018-05-18: 50 mg via INTRAVENOUS
  Administered 2018-05-18: 100 mg via INTRAVENOUS

## 2018-05-18 MED ORDER — LIDOCAINE 2% (20 MG/ML) 5 ML SYRINGE
INTRAMUSCULAR | Status: AC
  Filled 2018-05-18: qty 5

## 2018-05-18 MED ORDER — PROPOFOL 10 MG/ML IV BOLUS
INTRAVENOUS | Status: DC | PRN
  Administered 2018-05-18: 70 mg via INTRAVENOUS

## 2018-05-18 MED ORDER — FENTANYL CITRATE (PF) 250 MCG/5ML IJ SOLN
INTRAMUSCULAR | Status: DC | PRN
  Administered 2018-05-18: 150 ug via INTRAVENOUS
  Administered 2018-05-18: 50 ug via INTRAVENOUS
  Administered 2018-05-18: 500 ug via INTRAVENOUS
  Administered 2018-05-18: 50 ug via INTRAVENOUS
  Administered 2018-05-18 (×2): 250 ug via INTRAVENOUS

## 2018-05-18 MED ORDER — ACETAMINOPHEN 160 MG/5ML PO SOLN: 650.0000 mg | Freq: Once | ORAL | Status: AC

## 2018-05-18 MED ORDER — PHENYLEPHRINE HCL-NACL 20-0.9 MG/250ML-% IV SOLN
0.0000 ug/min | INTRAVENOUS | Status: DC
  Administered 2018-05-18: 60 ug/min via INTRAVENOUS
  Administered 2018-05-19: 15 ug/min via INTRAVENOUS
  Filled 2018-05-18 (×2): qty 250

## 2018-05-18 MED ORDER — SODIUM CHLORIDE 0.9 % IV SOLN
1.5000 g | Freq: Two times a day (BID) | INTRAVENOUS | Status: AC
  Administered 2018-05-18 – 2018-05-20 (×4): 1.5 g via INTRAVENOUS
  Filled 2018-05-18 (×4): qty 1.5

## 2018-05-18 MED ORDER — HEPARIN SODIUM (PORCINE) 1000 UNIT/ML IJ SOLN
INTRAMUSCULAR | Status: AC
  Filled 2018-05-18: qty 1

## 2018-05-18 MED ORDER — NITROGLYCERIN IN D5W 200-5 MCG/ML-% IV SOLN: 0.0000 ug/min | INTRAVENOUS | Status: DC

## 2018-05-18 MED ORDER — MORPHINE SULFATE (PF) 2 MG/ML IV SOLN
1.0000 mg | INTRAVENOUS | Status: DC | PRN
  Administered 2018-05-18: 2 mg via INTRAVENOUS
  Administered 2018-05-19 – 2018-05-25 (×3): 4 mg via INTRAVENOUS
  Filled 2018-05-18 (×4): qty 2

## 2018-05-18 MED ORDER — METOPROLOL TARTRATE 12.5 MG HALF TABLET
12.5000 mg | ORAL_TABLET | Freq: Once | ORAL | Status: AC
  Administered 2018-05-18: 12.5 mg via ORAL
  Filled 2018-05-18: qty 1

## 2018-05-18 MED ORDER — SODIUM CHLORIDE 0.9 % IR SOLN
Status: DC | PRN
  Administered 2018-05-18: 5000 mL

## 2018-05-18 SURGICAL SUPPLY — 155 items
ADAPTER CARDIO PERF ANTE/RETRO (ADAPTER) ×6 IMPLANT
APPLICATOR COTTON TIP 6IN STRL (MISCELLANEOUS) IMPLANT
ARTICLIP LAA PROCLIP II 45 (Clip) ×3 IMPLANT
ATTRACTOMAT 16X20 MAGNETIC DRP (DRAPES) ×3 IMPLANT
BAG DECANTER FOR FLEXI CONT (MISCELLANEOUS) ×9 IMPLANT
BANDAGE ACE 4X5 VEL STRL LF (GAUZE/BANDAGES/DRESSINGS) ×3 IMPLANT
BANDAGE ACE 6X5 VEL STRL LF (GAUZE/BANDAGES/DRESSINGS) ×3 IMPLANT
BASKET HEART (ORDER IN 25'S) (MISCELLANEOUS) ×1
BASKET HEART (ORDER IN 25S) (MISCELLANEOUS) ×2 IMPLANT
BLADE CLIPPER SURG (BLADE) IMPLANT
BLADE STERNUM SYSTEM 6 (BLADE) ×6 IMPLANT
BLADE SURG 11 STRL SS (BLADE) ×3 IMPLANT
BNDG GAUZE ELAST 4 BULKY (GAUZE/BANDAGES/DRESSINGS) ×3 IMPLANT
CANISTER SUCT 3000ML PPV (MISCELLANEOUS) ×6 IMPLANT
CANN PRFSN 3/8X14X24FR PCFC (MISCELLANEOUS)
CANN PRFSN 3/8XCNCT ST RT ANG (MISCELLANEOUS)
CANNULA EZ GLIDE AORTIC 21FR (CANNULA) ×3 IMPLANT
CANNULA FEM VENOUS REMOTE 22FR (CANNULA) ×3 IMPLANT
CANNULA GUNDRY RCSP 15FR (MISCELLANEOUS) ×3 IMPLANT
CANNULA PRFSN 3/8X14X24FR PCFC (MISCELLANEOUS) IMPLANT
CANNULA PRFSN 3/8XCNCT RT ANG (MISCELLANEOUS) IMPLANT
CANNULA VEN MTL TIP RT (MISCELLANEOUS)
CANNULA VENNOUS METAL TIP 20FR (CANNULA) ×6 IMPLANT
CATH CPB KIT OWEN (MISCELLANEOUS) ×3 IMPLANT
CATH FOLEY 2WAY SLVR  5CC 14FR (CATHETERS)
CATH FOLEY 2WAY SLVR 5CC 14FR (CATHETERS) IMPLANT
CATH THORACIC 28FR RT ANG (CATHETERS) IMPLANT
CATH THORACIC 36FR (CATHETERS) ×3 IMPLANT
CLAMP ISOLATOR SYNERGY LG (MISCELLANEOUS) ×3 IMPLANT
CLAMP OLL ABLATION (MISCELLANEOUS) ×3 IMPLANT
CLIP FOGARTY SPRING 6M (CLIP) IMPLANT
CLIP RETRACTION 3.0MM CORONARY (MISCELLANEOUS) ×3 IMPLANT
CLIP VESOCCLUDE MED 24/CT (CLIP) IMPLANT
CLIP VESOCCLUDE SM WIDE 24/CT (CLIP) IMPLANT
CONN 1/2X1/2X1/2  BEN (MISCELLANEOUS) ×1
CONN 1/2X1/2X1/2 BEN (MISCELLANEOUS) ×2 IMPLANT
CONN 3/8X1/2 ST GISH (MISCELLANEOUS) ×6 IMPLANT
COVER SURGICAL LIGHT HANDLE (MISCELLANEOUS) ×6 IMPLANT
COVER WAND RF STERILE (DRAPES) ×6 IMPLANT
CRADLE DONUT ADULT HEAD (MISCELLANEOUS) ×6 IMPLANT
DERMABOND ADVANCED (GAUZE/BANDAGES/DRESSINGS) ×1
DERMABOND ADVANCED .7 DNX12 (GAUZE/BANDAGES/DRESSINGS) ×2 IMPLANT
DEVICE ATRICLIP LAA PRCLPII 45 (Clip) ×2 IMPLANT
DEVICE SUT CK QUICK LOAD MINI (Prosthesis & Implant Heart) ×3 IMPLANT
DRAIN CHANNEL 32F RND 10.7 FF (WOUND CARE) ×9 IMPLANT
DRAPE BILATERAL SPLIT (DRAPES) IMPLANT
DRAPE CARDIOVASCULAR INCISE (DRAPES) ×1
DRAPE CV SPLIT W-CLR ANES SCRN (DRAPES) ×3 IMPLANT
DRAPE INCISE IOBAN 66X45 STRL (DRAPES) ×6 IMPLANT
DRAPE PERI GROIN 82X75IN TIB (DRAPES) ×3 IMPLANT
DRAPE SLUSH/WARMER DISC (DRAPES) ×6 IMPLANT
DRAPE SRG 135X102X78XABS (DRAPES) ×2 IMPLANT
DRSG AQUACEL AG ADV 3.5X14 (GAUZE/BANDAGES/DRESSINGS) ×3 IMPLANT
DRSG COVADERM 4X14 (GAUZE/BANDAGES/DRESSINGS) ×6 IMPLANT
ELECT BLADE 4.0 EZ CLEAN MEGAD (MISCELLANEOUS) ×3
ELECT REM PT RETURN 9FT ADLT (ELECTROSURGICAL) ×6
ELECTRODE BLDE 4.0 EZ CLN MEGD (MISCELLANEOUS) ×2 IMPLANT
ELECTRODE REM PT RTRN 9FT ADLT (ELECTROSURGICAL) ×4 IMPLANT
FELT TEFLON 1X6 (MISCELLANEOUS) ×6 IMPLANT
GAUZE SPONGE 4X4 12PLY STRL (GAUZE/BANDAGES/DRESSINGS) ×6 IMPLANT
GLOVE BIO SURGEON STRL SZ 6 (GLOVE) IMPLANT
GLOVE BIO SURGEON STRL SZ 6.5 (GLOVE) IMPLANT
GLOVE BIO SURGEON STRL SZ7 (GLOVE) IMPLANT
GLOVE BIO SURGEON STRL SZ7.5 (GLOVE) ×3 IMPLANT
GLOVE BIOGEL PI IND STRL 6.5 (GLOVE) ×20 IMPLANT
GLOVE BIOGEL PI INDICATOR 6.5 (GLOVE) ×10
GLOVE ORTHO TXT STRL SZ7.5 (GLOVE) ×6 IMPLANT
GOWN STRL REUS W/ TWL LRG LVL3 (GOWN DISPOSABLE) ×16 IMPLANT
GOWN STRL REUS W/TWL LRG LVL3 (GOWN DISPOSABLE) ×8
HEMOSTAT POWDER SURGIFOAM 1G (HEMOSTASIS) ×12 IMPLANT
INSERT FOGARTY XLG (MISCELLANEOUS) ×6 IMPLANT
IV NS IRRIG 3000ML ARTHROMATIC (IV SOLUTION) ×3 IMPLANT
KIT BASIN OR (CUSTOM PROCEDURE TRAY) ×3 IMPLANT
KIT DILATOR VASC 18G NDL (KITS) ×3 IMPLANT
KIT DRAINAGE VACCUM ASSIST (KITS) ×3 IMPLANT
KIT SUCTION CATH 14FR (SUCTIONS) ×21 IMPLANT
KIT SUT CK MINI COMBO 4X17 (Prosthesis & Implant Heart) ×3 IMPLANT
KIT TURNOVER KIT B (KITS) ×3 IMPLANT
KIT VASOVIEW HEMOPRO 2 VH 4000 (KITS) ×3 IMPLANT
KIT VASOVIEW HEMOPRO VH 3000 (KITS) ×3 IMPLANT
LEAD PACING MYOCARDI (MISCELLANEOUS) ×3 IMPLANT
LINE VENT (MISCELLANEOUS) ×3 IMPLANT
LOOP VESSEL SUPERMAXI WHITE (MISCELLANEOUS) ×6 IMPLANT
MARKER GRAFT CORONARY BYPASS (MISCELLANEOUS) ×9 IMPLANT
NS IRRIG 1000ML POUR BTL (IV SOLUTION) ×15 IMPLANT
PACK E OPEN HEART (SUTURE) ×3 IMPLANT
PACK OPEN HEART (CUSTOM PROCEDURE TRAY) ×3 IMPLANT
PAD ARMBOARD 7.5X6 YLW CONV (MISCELLANEOUS) ×12 IMPLANT
PAD ELECT DEFIB RADIOL ZOLL (MISCELLANEOUS) ×3 IMPLANT
PENCIL BUTTON HOLSTER BLD 10FT (ELECTRODE) ×6 IMPLANT
PROBE CRYO2-ABLATION MALLABLE (MISCELLANEOUS) ×3 IMPLANT
PUNCH AORTIC ROTATE  4.5MM 8IN (MISCELLANEOUS) ×3 IMPLANT
PUNCH AORTIC ROTATE 4.0MM (MISCELLANEOUS) IMPLANT
PUNCH AORTIC ROTATE 4.5MM 8IN (MISCELLANEOUS) IMPLANT
PUNCH AORTIC ROTATE 5MM 8IN (MISCELLANEOUS) IMPLANT
RING MITRAL MEMO 4D 34 (Prosthesis & Implant Heart) ×3 IMPLANT
SET CARDIOPLEGIA MPS 5001102 (MISCELLANEOUS) ×3 IMPLANT
SET IRRIG TUBING LAPAROSCOPIC (IRRIGATION / IRRIGATOR) ×3 IMPLANT
SOLUTION ANTI FOG 6CC (MISCELLANEOUS) ×3 IMPLANT
SPONGE LAP 18X18 RF (DISPOSABLE) ×3 IMPLANT
SPONGE LAP 18X18 X RAY DECT (DISPOSABLE) IMPLANT
SPONGE LAP 4X18 RFD (DISPOSABLE) ×3 IMPLANT
SUCKER INTRACARDIAC WEIGHTED (SUCKER) ×3 IMPLANT
SUT BONE WAX W31G (SUTURE) ×6 IMPLANT
SUT ETHIBOND (SUTURE) ×6 IMPLANT
SUT ETHIBOND 2 0 SH (SUTURE) ×5 IMPLANT
SUT ETHIBOND 2 0 SH 36X2 (SUTURE) ×4 IMPLANT
SUT ETHIBOND 2 0 V4 (SUTURE) IMPLANT
SUT ETHIBOND 2 0V4 GREEN (SUTURE) IMPLANT
SUT ETHIBOND 2-0 RB-1 WHT (SUTURE) ×6 IMPLANT
SUT ETHIBOND 4 0 TF (SUTURE) IMPLANT
SUT ETHIBOND 5 0 C 1 30 (SUTURE) ×3 IMPLANT
SUT ETHIBOND X763 2 0 SH 1 (SUTURE) ×12 IMPLANT
SUT MNCRL AB 3-0 PS2 18 (SUTURE) ×12 IMPLANT
SUT MNCRL AB 4-0 PS2 18 (SUTURE) IMPLANT
SUT PDS AB 1 CTX 36 (SUTURE) ×12 IMPLANT
SUT PROLENE 2 0 SH DA (SUTURE) IMPLANT
SUT PROLENE 3 0 SH 1 (SUTURE) ×3 IMPLANT
SUT PROLENE 3 0 SH DA (SUTURE) ×3 IMPLANT
SUT PROLENE 3 0 SH1 36 (SUTURE) ×12 IMPLANT
SUT PROLENE 4 0 RB 1 (SUTURE) ×3
SUT PROLENE 4 0 SH DA (SUTURE) ×6 IMPLANT
SUT PROLENE 4-0 RB1 .5 CRCL 36 (SUTURE) ×6 IMPLANT
SUT PROLENE 5 0 C 1 36 (SUTURE) IMPLANT
SUT PROLENE 6 0 C 1 30 (SUTURE) ×6 IMPLANT
SUT PROLENE 7.0 RB 3 (SUTURE) ×15 IMPLANT
SUT PROLENE 8 0 BV175 6 (SUTURE) IMPLANT
SUT PROLENE BLUE 7 0 (SUTURE) ×3 IMPLANT
SUT PROLENE POLY MONO (SUTURE) IMPLANT
SUT SILK  1 MH (SUTURE) ×2
SUT SILK 1 MH (SUTURE) ×4 IMPLANT
SUT STEEL 6MS V (SUTURE) IMPLANT
SUT STEEL STERNAL CCS#1 18IN (SUTURE) IMPLANT
SUT STEEL SZ 6 DBL 3X14 BALL (SUTURE) IMPLANT
SUT VIC AB 1 CTX 36 (SUTURE)
SUT VIC AB 1 CTX36XBRD ANBCTR (SUTURE) IMPLANT
SUT VIC AB 2-0 CT1 27 (SUTURE) ×1
SUT VIC AB 2-0 CT1 TAPERPNT 27 (SUTURE) ×2 IMPLANT
SUT VIC AB 2-0 CTX 27 (SUTURE) IMPLANT
SUT VIC AB 3-0 SH 27 (SUTURE)
SUT VIC AB 3-0 SH 27X BRD (SUTURE) IMPLANT
SUT VIC AB 3-0 X1 27 (SUTURE) ×3 IMPLANT
SUT VICRYL 4-0 PS2 18IN ABS (SUTURE) IMPLANT
SYSTEM SAHARA CHEST DRAIN ATS (WOUND CARE) ×6 IMPLANT
TAPE CLOTH SURG 4X10 WHT LF (GAUZE/BANDAGES/DRESSINGS) ×3 IMPLANT
TAPE PAPER 2X10 WHT MICROPORE (GAUZE/BANDAGES/DRESSINGS) ×3 IMPLANT
TOWEL GREEN STERILE (TOWEL DISPOSABLE) ×3 IMPLANT
TOWEL GREEN STERILE FF (TOWEL DISPOSABLE) ×3 IMPLANT
TRAY FOLEY SLVR 14FR TEMP STAT (SET/KITS/TRAYS/PACK) IMPLANT
TRAY FOLEY SLVR 16FR TEMP STAT (SET/KITS/TRAYS/PACK) ×3 IMPLANT
TUBING INSUFFLATION (TUBING) ×3 IMPLANT
TUBING INSUFFLATION 10FT LAP (TUBING) ×3 IMPLANT
TUBING MEDICAL 3X8X3X32 (MISCELLANEOUS) ×3 IMPLANT
UNDERPAD 30X30 (UNDERPADS AND DIAPERS) ×3 IMPLANT
WATER STERILE IRR 1000ML POUR (IV SOLUTION) ×6 IMPLANT

## 2018-05-18 NOTE — Progress Notes (Signed)
  Echocardiogram 2D Echocardiogram has been performed.  Gerda DissArthur L Camaron Cammack 05/18/2018, 1:23 PM

## 2018-05-18 NOTE — Progress Notes (Signed)
RT started Heart protocol wean. Pt settings PS of 10 above PEEP with a PEEP of 5 and FIO2 of 40%. Pt was able to lift head off of pillow and hold, squeeze hands, move feet upon request. Pt tolerating wean thus far. RT will continue to monitor

## 2018-05-18 NOTE — Brief Op Note (Signed)
05/18/2018  12:46 PM  PATIENT:  Earl EvenerWilliam E Martin  57 y.o. male  PRE-OPERATIVE DIAGNOSIS:  MR CAD AFIB  POST-OPERATIVE DIAGNOSIS:  MR CAD AFIB  PROCEDURE:  Procedure(s) with comments: MITRAL VALVE REPAIR (MVR) (N/A) - Using Memo 4D Semirigid Annuloplasty Ring Size 34 CORONARY ARTERY BYPASS GRAFTING (CABG), ON PUMP, TIMES ONE, USING ENDOSCOPICALLY HARVESTED RIGHT GREATER SAPHENOUS VEIN (N/A) MAZE (N/A) TRANSESOPHAGEAL ECHOCARDIOGRAM (TEE) (N/A)  SURGEON:  Surgeon(s) and Role:    Purcell Nails* Xela Oregel H, MD - Primary  PHYSICIAN ASSISTANT: WAYNE GOLD PA-C  ASSISTANTS: DIANA ACUNA RNFA   ANESTHESIA:   none  EBL:  550  BLOOD ADMINISTERED:none  DRAINS: MEDIASTINAL CHEST TUBES   LOCAL MEDICATIONS USED:  NONE  SPECIMEN:  No Specimen  DISPOSITION OF SPECIMEN:  N/A  COUNTS:  YES  TOURNIQUET:  * No tourniquets in log *  DICTATION: .Dragon Dictation  PLAN OF CARE: Admit to inpatient   PATIENT DISPOSITION:  ICU - intubated and hemodynamically stable.   Delay start of Pharmacological VTE agent (>24hrs) due to surgical blood loss or risk of bleeding: yes  COMPLICATIONS: NO KNOWN

## 2018-05-18 NOTE — Anesthesia Procedure Notes (Signed)
Procedure Name: Intubation Date/Time: 05/18/2018 9:02 AM Performed by: Julian ReilWelty, Cristianna Cyr F, CRNA Pre-anesthesia Checklist: Patient identified, Emergency Drugs available, Suction available and Patient being monitored Patient Re-evaluated:Patient Re-evaluated prior to induction Oxygen Delivery Method: Circle system utilized Preoxygenation: Pre-oxygenation with 100% oxygen Induction Type: IV induction Ventilation: Mask ventilation without difficulty, Oral airway inserted - appropriate to patient size and Two handed mask ventilation required Laryngoscope Size: Glidescope and 4 Grade View: Grade I Tube type: Oral Tube size: 8.0 mm Number of attempts: 1 Airway Equipment and Method: Stylet and Video-laryngoscopy Placement Confirmation: ETT inserted through vocal cords under direct vision,  positive ETCO2 and breath sounds checked- equal and bilateral Secured at: 22 cm Tube secured with: Tape Dental Injury: Teeth and Oropharynx as per pre-operative assessment  Comments: Glidescope electively used due to recessed chin.

## 2018-05-18 NOTE — Progress Notes (Signed)
Patient ID: Earl EvenerWilliam E Nardo, male   DOB: 02/27/1961, 57 y.o.   MRN: [id number] EVENING ROUNDS NOTE :     [address]       [place] [id number]             [phone]                 Day of Surgery Procedure(s) (LRB): MITRAL VALVE REPAIR (MVR) (N/A) CORONARY ARTERY BYPASS GRAFTING (CABG), ON PUMP, TIMES ONE, USING ENDOSCOPICALLY HARVESTED RIGHT GREATER SAPHENOUS VEIN (N/A) MAZE (N/A) TRANSESOPHAGEAL ECHOCARDIOGRAM (TEE) (N/A)  Total Length of Stay:  LOS: 0 days  BP 99/78   Pulse 80   Temp (!) 96.6 F (35.9 C)   Resp 18   SpO2 100%   .Intake/Output      12/03 0701 - 12/04 0700 12/04 0701 - 12/05 0700   I.V.  21303082.5   Blood  1428   IV Piggyback  1152.3   Total Intake  5662.8   Urine  1380   Emesis/NG output  0   Blood  2400   Chest Tube  420   Total Output  4200   Net  +1462.8          . sodium chloride Stopped (05/18/18 1738)  . sodium chloride 100 mL/hr at 05/18/18 1800  . [START ON 05/19/2018] sodium chloride    . sodium chloride 10 mL/hr at 05/18/18 1510  . albumin human 12.5 g (05/18/18 1649)  . cefUROXime (ZINACEF)  IV 200 mL/hr at 05/18/18 1800  . dexmedetomidine (PRECEDEX) IV infusion 0.5 mcg/kg/hr (05/18/18 1629)  . famotidine (PEPCID) IV Stopped (05/18/18 1621)  . insulin    . lactated ringers    . lactated ringers    . lactated ringers 20 mL/hr at 05/18/18 1800  . magnesium sulfate 20 mL/hr at 05/18/18 1800  . milrinone 0.3 mcg/kg/min (05/18/18 1800)  . nitroGLYCERIN    . phenylephrine (NEO-SYNEPHRINE) Adult infusion 70 mcg/min (05/18/18 1800)  . vancomycin       Lab Results  Component Value Date   WBC 16.5 (H) 05/18/2018   HGB 14.7 05/18/2018   HCT 45.0 05/18/2018   PLT 108 (L) 05/18/2018   GLUCOSE 104 (H) 05/18/2018   CHOL 172 08/22/2009   TRIG 73.0 08/22/2009   HDL 59.90 08/22/2009   LDLCALC 98 08/22/2009   ALT 24 05/16/2018   AST 30 05/16/2018   NA 137 05/18/2018   K 4.2 05/18/2018   CL 105 05/18/2018   CREATININE  0.40 (L) 05/18/2018   BUN 8 05/18/2018   CO2 19 (L) 05/16/2018   TSH  12/23/2007    1.879 (NOTE)Please note change in reference range(s).Test methodology is 3rd generation TSH   INR 1.53 05/18/2018   HGBA1C 5.4 05/16/2018   Chest xray shows resolution of right ptx  Remains sedated on vent 420 from chest tubes since surgery CI 1.6   Delight OvensEdward B Jawanza Zambito MD  Beeper [phone] Office [phone] 05/18/2018 6:32 PM

## 2018-05-18 NOTE — Progress Notes (Signed)
Recruitment maneuver done due to decrease sat to 88% on 100%. SAT increased to 100%

## 2018-05-18 NOTE — Op Note (Signed)
CARDIOTHORACIC SURGERY OPERATIVE NOTE  Date of Procedure:  05/18/2018  Preoperative Diagnosis:   Severe Mitral Regurgitation  Recurrent Persistent Atrial Fibrillation  Multi-vessel Coronary Artery Disease  Postoperative Diagnosis: Same  Procedure:   Mitral Valve Repair  Sorin Memo 4D ring annuloplasty (size 34mm, model #4DM-34, serial #[id number])  Coronary Artery Bypass Grafting x 1  Saphenous Vein Graft to Right Posterolateral Branch Coronary Artery  Endoscopic Vein Harvest from Right Thigh    Maze Procedure   complete bilateral atrial lesion set using bipolar radiofrequency and cryothermy ablation  clipping of left atrial appendage (Atricure Pro245 left atrial clip size 45mm)    Surgeon: Salvatore Decent. Cornelius Moras, MD  Assistant: Rowe Clack, PA-C  Anesthesia: Germaine Pomfret, MD  Operative Findings:  Type IIIb mitral valve dysfunction with severe mitral regurgitation  Mild left ventricular systolic dysfunction  Good quality saphenous vein conduit for grafting  Good quality target vessel for grafting  No residual mitral regurgitation after successful valve repair               BRIEF CLINICAL NOTE AND INDICATIONS FOR SURGERY  Patient is a 57 year old male with history of coronary artery disease status post inferolateral wall myocardial infarction in 2012 status post multivessel PCI and stenting at that time, mitral regurgitation, hypertension, long-standing persistent atrial fibrillation, obstructive sleep apnea, abdominal aortic aneurysm status post endovascular repair in 2016, and long-standing tobacco abuse who has been referred for surgical consultation to discuss treatment options for management of severe symptomatic mitral regurgitation.  Patient's cardiac history dates back to 2012 when he presented with an acute inferolateral wall myocardial infarction. He was found to have multivessel coronary artery disease and underwent PCI and stenting of the  right coronary artery in the left anterior descending coronary artery at that time. He has been followed regularly ever since by Dr. Jacinto Halim. He developed atrial fibrillation and failed an attempt at DC cardioversion. He has been treated with rate control and long term anticoagulation using Xarelto. Follow-up echocardiograms documented the presence of mitral regurgitation which has persisted and gotten worse in follow-up despite optimal medical therapy with close follow-up. Recent follow-up transthoracic echocardiogram performed March 29, 2018 at Dr. Verl Dicker office revealed borderline low left ventricular systolic function with calculated ejection fraction estimated 53% in the setting of severe mitral regurgitation. There was massive left atrial chamber enlargement. There was at least grade 3 mitral regurgitation. There was mild to moderate tricuspid regurgitation. There were findings suggestive of moderate to severe pulmonary hypertension. He subsequentlyunderwent transesophageal echocardiogram and diagnostic cardiac catheterization by Dr. Jacinto Halim April 22, 2018. TEE confirmed the presence of severe mitral regurgitation with regurgitant volume calculated greater than 60 mLand flow reversal in the pulmonary veins. There was massive left atrial chamber enlargement. Left ventricular function was mildly reduced. Catheterization revealed multivessel coronary artery disease with what was felt to be approximately 50% stenosis of the proximal left anterior descending coronary artery just beyond the distal portion of the previous stent. There was nonobstructive disease in the left circumflex system. There was 100% chronic occlusion of the distal right coronary artery. There was left-to-right collateral filling of the terminal branches of the right coronary artery. There was mild to moderate pulmonary hypertension with preserved cardiac output. Cardio thoracic surgical consultation was requested.  The  patient has been seen in consultation and counseled at length regarding the indications, risks and potential benefits of surgery.  All questions have been answered, and the patient provides full informed consent for the operation as  described.    DETAILS OF THE OPERATIVE PROCEDURE  Preparation:  The patient is brought to the operating room on the above mentioned date and central monitoring was established by the anesthesia team including placement of Swan-Ganz catheter and radial arterial line.  The patient had moderate pulmonary hypertension.  The patient is placed in the supine position on the operating table.  Intravenous antibiotics are administered. General endotracheal anesthesia is induced uneventfully. A Foley catheter is placed.  Baseline transesophageal echocardiogram was performed.  Findings were notable for low normal left ventricular systolic dysfunction with severe inferior wall hypokinesis or akinesis.  There was severe mitral regurgitation.  Left ventricular ejection fraction was normal in the setting of severe mitral regurgitation.  Functional anatomy of the mitral valve appeared consistent with type IIIb dysfunction causing systolic restriction of the posterior leaflet.  There was annular dilatation.  Both the anterior and posterior leaflets appeared normal.  There was severe left atrial enlargement.  The aortic valve appeared normal.  Right ventricular function was normal.  There is trivial tricuspid regurgitation.  The patient's chest, abdomen, both groins, and both lower extremities are prepared and draped in a sterile manner. A time out procedure is performed.   Surgical Approach and Conduit Harvest:  The greater saphenous vein is obtained from the patient's right thigh using endoscopic vein harvest technique. The saphenous vein is notably good quality conduit. After removal of the saphenous vein, the small surgical incisions in the lower extremity are closed with absorbable  suture.  A median sternotomy incision was performed and the pericardium is opened. The ascending aorta is normal in appearance.    Extracorporeal Cardiopulmonary Bypass and Myocardial Protection:  The right common femoral vein is cannulated using the Seldinger technique and a guidewire advanced into the right atrium using TEE guidance.  The patient is heparinized systemically and the femoral vein cannulated using a 22 Fr long femoral venous cannula.  The ascending aorta is cannulated for cardiopulmonary bypass.  Adequate heparinization is verified.   A retrograde cardioplegia cannula is placed through the right atrium into the coronary sinus.   The entire pre-bypass portion of the operation was notable for stable hemodynamics.  Cardiopulmonary bypass was begun and the surface of the heart is inspected.  A second venous cannula is placed directly into the superior vena cava.   A cardioplegia cannula is placed in the ascending aorta.  A temperature probe was placed in the interventricular septum.  The patient is cooled to 32C systemic temperature.   The aortic cross clamp is applied and cardioplegia is delivered initially in an antegrade fashion through the aortic root using modified [place] cold blood cardioplegia (Kennestone blood cardioplegia protocol).   The initial cardioplegic arrest is rapid with early diastolic arrest.  Repeat doses of cardioplegia are administered at 90 minutes and every 30 minutes thereafter through the subsequently placed vein graft and through the coronary sinus catheter in order to maintain completely flat electrocardiogram.  Myocardial protection was felt to be excellent.   Coronary Artery Bypass Grafting:  The posterolateral branch of the distal right coronary artery was grafted using a reversed saphenous vein graft in an end-to-side fashion.  At the site of distal anastomosis the target vessel was good quality and measured approximately 1.8 mm in diameter.   Maze  Procedure (left atrial lesion set):  The AtriCure Synergy bipolar radiofrequency ablation clamp is used for all radiofrequency ablation lesions for the maze procedure.  The Atricure CryoICE nitrous oxide cryothermy system is  utilized for all cryothermy ablation lesions.   The heart is retracted towards the surgeon's side and the left sided pulmonary veins exposed.  An elliptical ablation lesion is created around the base of the left sided pulmonary veins.  A similar elliptical lesion was created around the base of the left atrial appendage.  The left atrial appendage was obliterated using an left atrial appendage clip (Atricure Pro245 left atrial clip size 45mm).  The heart was replaced into the pericardial sac.  An elliptical ablation lesion is created around the base of the right sided pulmonary veins.   A left atriotomy incision was performed through the interatrial groove and extended partially across the back wall of the left atrium after opening the oblique sinus inferiorly.  The floor of the left atrium and the mitral valve were exposed using a self-retaining retractor.  The mitral valve was inspected.  The mitral valve leaflets appeared essentially normal.  The mitral annulus was dilated and the overall size of the mitral valve relatively large.  The left atrium was very large.  A bipolar ablation lesion was placed across the dome of the left atrium from the cephalad apex of the atriotomy incision to reach the cephalad apex of the elliptical lesion around the left sided pulmonary veins.  A similar bipolar lesion was placed across the back wall of the left atrium from the caudad apex of the atriotomy incision to reach the caudad apex of the elliptical lesion around the left sided pulmonary veins, thereby completing a box.  Finally another bipolar lesion was placed across the back wall of the left atrium from the caudad apex of the atriotomy incision towards the posterior mitral valve annulus.  This  lesion was completed along the endocardial surface onto the posterior mitral annulus with a 3 minute duration cryothermy lesion, followed by a second cryothermy lesion along the posterior epicardial surface of the left atrium across the coronary sinus with the probe extending up the epicardial surface of the lateral wall of the right atrium. This completes the entire left side lesion set of the Cox maze procedure.   Mitral Valve Repair:  The mitral valve was inspected and notable for essentially normal leaflet appearance with minimal leaflet sclerosis.  There was minimal annular calcification with exception of an area of calcification posterior to the annulus in the P2 P3 region.  The subvalvular apparatus appeared normal.  The valve itself was relatively large and the mitral annulus was very dilated.  Ring annuloplasty was performed using interrupted 2-0 Ethibond horizontal mattress sutures placed circumferentially around the mitral annulus.  The mitral valve is sized to 38 mm annuloplasty ring.  Given the ischemic etiology annuloplasty is downsized and a 34 mm annuloplasty ring chosen.  Ring annuloplasty is performed using a Sorin Memo 4D annuloplasty ring (size 34 mm, ref #4DM-34, serial #[id number]).  The ring is lowered into place uneventfully and all sutures secured using Cor-knot device.  The valve is inspected and appears to be functioning normally.  There is a broad symmetrical line of coaptation with no residual leak.  Line of coaptation is checked using blue ink test to ensure plenty of overlap.  Rewarming is begun.  The atriotomy was closed using a 2-layer closure of running 3-0 Prolene suture after placing a sump drain across the mitral valve to serve as a left ventricular vent.  The single proximal saphenous vein anastomosis is performed directly to the ascending aorta prior to removal of the aortic cross-clamp.  One final dose  of warm retrograde "reanimation dose" cardioplegia was  administered retrograde through the coronary sinus catheter while all air was evacuated through the aortic root.  The aortic cross clamp was removed after a total cross clamp time of 128 minutes.   Maze Procedure (right atrial lesion set):  The retrograde cardioplegia cannula was removed and the small hole in the right atrium extended a short distance.  The AtriCure Synergy bipolar radiofrequency ablation clamp is utilized to create a series of linear lesions in the right atrium, each with one limb of the clamp along the endocardial surface and the other along the epicardial surface. The first lesion is placed from the posterior apex of the atriotomy incision and along the lateral wall of the right atrium to reach the lateral aspect of the superior vena cava, connecting with the cryothermy lesion placed previously across the coronary sinus and up the epicardial surface of the lateral right atrium.  A second lesion is placed in the opposite direction from the posterior apex of the atriotomy incision along the lateral wall to reach the lateral aspect of the inferior vena cava. A third lesion is placed from the midportion of the atriotomy incision extending at a right angle to reach the tip of the right atrial appendage. A fourth lesion is placed from the anterior apex of the atriotomy incision in an anterior and inferior direction to reach the acute margin of the heart. Finally, the cryotherapy probe is utilized to complete the right atrial lesion set by placing the probe along the endocardial surface of the right atrium from the anterior apex of the atriotomy incision to reach the tricuspid annulus at the 2:00 position. The right atriotomy incision is closed with a 2 layer closure of running 4-0 Prolene suture.   Procedure Completion:  Epicardial pacing wires are fixed to the right ventricular outflow tract and to the right atrial appendage. The patient is rewarmed to 37C temperature. The aortic and left  ventricular vents are removed.  The superior vena cava cannula is removed.  The patient is weaned and disconnected from cardiopulmonary bypass.  The patient's rhythm at separation from bypass was AV paced.  The patient was weaned from cardioplegic bypass without any inotropic support. Total cardiopulmonary bypass time for the operation was 173 minutes.    Followup transesophageal echocardiogram performed after separation from bypass revealed well-seated annuloplasty ring in the mitral position.  The valve is functioning normally.  There was no residual mitral regurgitation.  Mean transvalvular gradient across the mitral valve was estimated less than 3 mmHg.  Left ventricular function was unchanged from preoperatively, although ejection fraction appeared somewhat decreased now that there was no mitral regurgitation.  After separation from bypass low-dose milrinone infusion is begun.  The aortic cannula was removed uneventfully. Protamine was administered to reverse the anticoagulation. The femoral venous cannula was removed and manual pressure held on the groin for 30 minutes.  The mediastinum and pleural space were inspected for hemostasis and irrigated with saline solution. The mediastinum and the left pleural space were drained using 3 chest tubes placed through separate stab incisions inferiorly.  The soft tissues anterior to the aorta were reapproximated loosely. The sternum is closed with double strength sternal wire. The soft tissues anterior to the sternum were closed in multiple layers and the skin is closed with a running subcuticular skin closure.   The post-bypass portion of the operation was notable for stable rhythm and hemodynamics.   No blood products were administered during the operation.  Patient Disposition:  The patient tolerated the procedure well and is transported to the surgical intensive care in stable condition. There are no intraoperative complications. All sponge instrument  and needle counts are verified correct at completion of the operation.     Salvatore Decent. Cornelius Moras MD 05/18/2018 2:25 PM

## 2018-05-18 NOTE — Interval H&P Note (Signed)
History and Physical Interval Note:  05/18/2018 7:33 AM  Earl Martin  has presented today for surgery, with the diagnosis of MR CAD AFIB  The various methods of treatment have been discussed with the patient and family. After consideration of risks, benefits and other options for treatment, the patient has consented to  Procedure(s) with comments: MITRAL VALVE REPAIR (MVR) (N/A) - Glutaraldehyde CORONARY ARTERY BYPASS GRAFTING (CABG) (N/A) MAZE (N/A) TRANSESOPHAGEAL ECHOCARDIOGRAM (TEE) (N/A) as a surgical intervention .  The patient's history has been reviewed, patient examined, no change in status, stable for surgery.  I have reviewed the patient's chart and labs.  Questions were answered to the patient's satisfaction.     Purcell Nailslarence H Jaylea Plourde

## 2018-05-18 NOTE — Anesthesia Procedure Notes (Signed)
Arterial Line Insertion Start/End12/09/2017 8:00 AM, 05/18/2018 8:05 AM Performed by: Arta Brucessey, Jeanise Durfey, MD, anesthesiologist  Patient location: OR. Preanesthetic checklist: patient identified, IV checked, risks and benefits discussed, surgical consent, monitors and equipment checked, pre-op evaluation, timeout performed and anesthesia consent Lidocaine 1% used for infiltration Right, radial was placed Catheter size: 20 G Hand hygiene performed  and maximum sterile barriers used   Attempts: 2 Procedure performed without using ultrasound guided technique. Following insertion, dressing applied and Biopatch. Post procedure assessment: normal  Patient tolerated the procedure well with no immediate complications.

## 2018-05-18 NOTE — Anesthesia Postprocedure Evaluation (Signed)
Anesthesia Post Note  Patient: Earl Martin  Procedure(s) Performed: MITRAL VALVE REPAIR (MVR) (N/A Chest) CORONARY ARTERY BYPASS GRAFTING (CABG), ON PUMP, TIMES ONE, USING ENDOSCOPICALLY HARVESTED RIGHT GREATER SAPHENOUS VEIN (N/A Chest) MAZE (N/A ) TRANSESOPHAGEAL ECHOCARDIOGRAM (TEE) (N/A )     Patient location during evaluation: SICU Anesthesia Type: General Level of consciousness: sedated and patient remains intubated per anesthesia plan Pain management: pain level controlled Vital Signs Assessment: post-procedure vital signs reviewed and stable Respiratory status: patient remains intubated per anesthesia plan and patient on ventilator - see flowsheet for VS (required additional chest tube for PTX on CXR ) Cardiovascular status: stable (still paced) Postop Assessment: no apparent nausea or vomiting Anesthetic complications: no    Last Vitals:  Vitals:   05/18/18 1815 05/18/18 1845  BP:    Pulse: 80 80  Resp: 18 18  Temp: (!) 35.9 C (!) 36 C  SpO2: 100% 100%    Last Pain:  Vitals:   05/18/18 1700  PainSc: 0-No pain                 Earl Martin,Earl Martin

## 2018-05-18 NOTE — Procedures (Signed)
Extubation Procedure Note  Patient Details:   Name: Earl Martin DOB: 01/02/1961 MRN: [id number]   Airway Documentation:    Vent end date: 05/18/18 Vent end time: 2124   Evaluation  O2 sats: stable throughout Complications: No apparent complications Patient did tolerate procedure well. Bilateral Breath Sounds: Clear, Diminished   Yes  Pt positive cuff leak, suctioned, VC 2.25, NIF -28. Pt able to verbalize name and location post extubation. No stridor noted post extubation. Pt placed on 6LNC with humidity. Pt tolerating well. RT will continue to monitor.    Reginia NaasWendy M Daishon Chui 05/18/2018, 9:30 PM

## 2018-05-18 NOTE — OR Nursing (Signed)
Forty-five minute call to 2H charge nurse at 1345. Spoke to [place].

## 2018-05-18 NOTE — Progress Notes (Signed)
RT started Heart protocol wean. Pt settings 700vt/rate 4/Peep of 5 w/PS of 10/ FIO2 of 40%. Pt was able to lift head off of pillow and hold, squeeze hands, move feet upon request. Pt tolerating wean thus far. RT will continue to monitor

## 2018-05-18 NOTE — OR Nursing (Signed)
Twenty minute call to [facility] at 1415. Spoke to [place]. Cath Lab also notified of timing.

## 2018-05-18 NOTE — Anesthesia Procedure Notes (Signed)
Central Venous Catheter Insertion Performed by: Arta Brucessey, Waleed Dettman, MD, anesthesiologist Start/End12/09/2017 7:50 AM, 05/18/2018 8:00 AM Patient location: Pre-op. Preanesthetic checklist: patient identified, IV checked, risks and benefits discussed, surgical consent, monitors and equipment checked, pre-op evaluation, timeout performed and anesthesia consent Position: Trendelenburg Lidocaine 1% used for infiltration and patient sedated Hand hygiene performed  and maximum sterile barriers used  Catheter size: 8.5 Fr PA cath was placed.Sheath introducer Swan type:thermodilution Procedure performed using ultrasound guided technique. Ultrasound Notes:anatomy identified, needle tip was noted to be adjacent to the nerve/plexus identified, no ultrasound evidence of intravascular and/or intraneural injection and image(s) printed for medical record Attempts: 1 Following insertion, line sutured, dressing applied and Biopatch. Post procedure assessment: blood return through all ports, free fluid flow and no air  Patient tolerated the procedure well with no immediate complications.

## 2018-05-18 NOTE — Anesthesia Procedure Notes (Signed)
Arterial Line Insertion Start/End12/09/2017 7:37 AM, 05/18/2018 7:53 AM Performed by: Adair LaundryPaxton, Lynn A, CRNA, CRNA  Patient location: Pre-op. Preanesthetic checklist: patient identified, IV checked, risks and benefits discussed, surgical consent, monitors and equipment checked and pre-op evaluation Lidocaine 1% used for infiltration and patient sedated Left, radial was placed Catheter size: 20 G Hand hygiene performed  and maximum sterile barriers used   Attempts: 4 Procedure performed without using ultrasound guided technique. Post procedure assessment: normal  Patient tolerated the procedure well with no immediate complications. Additional procedure comments: Attempts for left radial arterial line placement, unsuccessful x 2 Shaley Leavens, CRNA and x 2 Luanna SalkPaxton, Scientist, clinical (histocompatibility and immunogenetics)CRNA.  Small hematoma noted.  Pressure held and pressure dressing applied..Marland Kitchen

## 2018-05-18 NOTE — H&P (Signed)
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 images for CARDIAC CATHETERIZATION   Link to Procedure Log   Procedure Log    Hemo Data    Most Recent Value  Fick Cardiac Output 5.23 L/min  Fick Cardiac Output Index 2.55 (L/min)/BSA  RA A Wave 5 mmHg  RA V Wave 8 mmHg  RA Mean 6 mmHg  RV Systolic Pressure 46 mmHg  RV Diastolic Pressure -2 mmHg  RV EDP 5 mmHg  PA Systolic Pressure 47 mmHg  PA Diastolic Pressure 22 mmHg  PA Mean 31 mmHg  PW A Wave 13 mmHg  PW V Wave 19 mmHg  PW Mean 14 mmHg  AO Systolic Pressure 86  mmHg  AO Diastolic Pressure 59 mmHg  AO Mean 70 mmHg  LV Systolic Pressure 86 mmHg  LV Diastolic Pressure 3 mmHg  LV EDP 11 mmHg  AOp Systolic Pressure 87 mmHg  AOp Diastolic Pressure 57 mmHg  AOp Mean Pressure 70 mmHg  LVp Systolic Pressure 87 mmHg  LVp Diastolic Pressure 3 mmHg  LVp EDP Pressure 10 mmHg  QP/QS 1  TPVR Index 12.16 HRUI  TSVR Index 25.5 HRUI  PVR SVR Ratio 0.29  TPVR/TSVR Ratio 0.48     CT ANGIOGRAPHY CHEST, ABDOMEN AND PELVIS  TECHNIQUE: Multidetector CT imaging through the chest, abdomen and pelvis was performed using the standard protocol during bolus administration of intravenous contrast. Multiplanar reconstructed images and MIPs were obtained and reviewed to evaluate the vascular anatomy.  CONTRAST: 75mL ISOVUE-370 IOPAMIDOL (ISOVUE-370) INJECTION 76%  COMPARISON: CT scan of July 08, 2015 and November 26, 2014.  FINDINGS: CTA CHEST FINDINGS  Cardiovascular: Atherosclerosis of thoracic  aorta is noted without aneurysm or dissection. Great vessels are widely patent without significant stenosis. Coronary calcifications are noted suggesting coronary artery disease. No pericardial effusion is noted. Severe left atrial enlargement is noted consistent with history of mitral valve insufficiency.  Mediastinum/Nodes: No enlarged mediastinal, hilar, or axillary lymph nodes. Thyroid gland, trachea, and esophagus demonstrate no significant findings.  Lungs/Pleura: Lungs are clear. No pleural effusion or pneumothorax.  Musculoskeletal: No chest wall abnormality. No acute or significant osseous findings.  Review of the MIP images confirms the above findings.  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 Probable small type 2 endoleak measuring 5 x 3 mm is noted best seen on image number 211 of series 9, most likely supplied by patent inferior mesenteric artery.  Excluded aneurysmal sac measures 4.3 cm which is significantly smaller compared to prior exam.  Celiac: Patent without evidence of aneurysm, dissection, vasculitis or significant stenosis.  SMA: Patent without evidence of aneurysm, dissection, vasculitis or significant stenosis.  Renals: Both renal arteries are patent without evidence of aneurysm, dissection, vasculitis, fibromuscular dysplasia or significant stenosis.  IMA: Patent and appears to be cysts supplying small type 2 endoleak.  Inflow: Atherosclerosis is noted without significant stenosis.  Veins: No obvious venous abnormality within the limitations of this arterial phase study.  Review of the MIP images confirms the above findings.  NON-VASCULAR  Hepatobiliary: No focal liver abnormality is seen. No gallstones, gallbladder wall thickening, or biliary dilatation.  Pancreas: Unremarkable. No pancreatic ductal dilatation or surrounding inflammatory changes.  Spleen: Normal in size without focal abnormality.  Adrenals/Urinary Tract: Adrenal glands are unremarkable. Kidneys are normal, without renal calculi, focal lesion, or hydronephrosis. Bladder is unremarkable.  Stomach/Bowel: The stomach appears normal. There is no evidence of bowel obstruction or inflammation.  Lymphatic: No significant adenopathy is noted.  Reproductive: Stable mild prostatic enlargement is noted.  Other: No abdominal wall hernia or abnormality. No abdominopelvic ascites.  Musculoskeletal: No acute or significant osseous findings.  Review of the MIP images confirms the above findings.  IMPRESSION: No evidence of thoracic aortic aneurysm or dissection is noted.  Severe left atrial enlargement is noted consistent with history of mitral insufficiency.  Coronary artery calcifications are noted suggesting coronary artery disease.  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 Small type  2 endoleak is noted measuring 5 mm which most likely supplied by patent inferior mesenteric artery. Excluded aneurysmal sac has maximum measured AP diameter 4.2 cm which is significantly smaller compared to prior exam.  Aortic Atherosclerosis (ICD10-I70.0).   Electronically Signed By: Lupita Raider, M.D. On: 05/04/2018 12:27   Impression:  Patient has stage D severe symptomatic mitral regurgitation with long-standing persistent atrial fibrillation and multivessel coronary artery disease. He describes stable symptoms of exertional shortness of breath and fatigue consistent with chronic combined systolic and diastolic congestive heart failure, New York Heart Association functional class II. 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 I have personally reviewed the patient's recent transthoracic and transesophageal echocardiograms, diagnostic cardiac catheterization, and CT angiogram. Echocardiograms document the presence of mild to moderate left ventricular systolic dysfunction with hypokinesis of the posterior lateral wall. Ejection fraction has been estimated between 55 and 60% in the setting of severe mitral regurgitation. There is severe left atrial chamber enlargement. Left ventricular size appears close to normal. Functional anatomy of the mitral valve is probably a combination  of primary and secondary disease with a combination of type I dysfunction related to severe annular enlargement and type IIIb dysfunction related to tethering of the posterior leaflet. This creates an overriding anterior leaflet with a somewhat eccentric jet of regurgitation directed around the posterior aspect of the left atrium. There is severe mitral regurgitation with regurgitant volume estimated greater than 60 mL per beat and flow reversal documented in the pulmonary veins. The patient's mitral regurgitation  is continued to progress despite close follow-up with optimal medical therapy. Patient also has long-standing persistent atrial fibrillation. Diagnostic cardiac catheterization demonstrates moderate disease in the proximal left anterior descending coronary artery with what was felt to be 50% stenosis just beyond the terminal portion of the previous stent. This lesion is presumably not flow-limiting but certainly appears angiographically significant. There is 100% chronic occlusion of the mid right coronary artery. There is some left-to-right collateral filling of the terminal branch of the distal right coronary system. However, whether or not the terminal branches of the distal right coronary would be graftable at the time of surgery is somewhat difficult to say. Iagree the patient needs elective mitral valve repair. He would likely benefit from concomitant Maze procedure. He may or may not benefit from concomitant coronary artery bypass grafting.  CT angiography reveals patent stent graft in the abdominal aorta and iliac vessels with type II endoleak.  There is moderate atherosclerotic disease in the common and external iliac arteries which might increased risks associated with an attempt at femoral artery cannulation for cardiopulmonary bypass.   Plan:  The patient was again counseled at length regarding the indications, risks and potential benefits of mitral valve repair. The rationale for elective surgery has been explained, including a comparison between surgery and continued medical therapy with close follow-up. The likelihood of successful and durable valve repair has been discussed with particular reference to the findings of their recent echocardiogram. Based upon these findings and previous experience, I have quoted them a greater than 90percent likelihood of successful valve repair. The relative risks and benefits of performing a maze procedure at the time of their surgery was  discussed at length, including the expected likelihood of long term freedom from recurrent symptomatic atrial fibrillation and/or atrial flutter. Alternative surgical approaches have been discussed including a comparison between conventional sternotomy and minimally-invasive techniques. The relative risks and benefits of each have been reviewed as they pertain to the patient's specific circumstances, and all of their questions have been addressed. The relative risks and benefits of concomitant coronary artery bypass grafting was discussed including the fact that conventional median sternotomy would be required if surgical revascularization of the patient's right coronary artery system were to be planned.   We plan to proceed with mitral valve repair, coronary artery bypass grafting, and Maze procedure on Wednesday, May 18, 2018 via conventional median sternotomy. The patient understands and accepts all potential risks of surgery including but not limited to risk of death, stroke or other neurologic complication, myocardial infarction, congestive heart failure, respiratory failure, renal failure, bleeding requiring transfusion and/or reexploration, arrhythmia, infection or other wound complications, pneumonia, pleural and/or pericardial effusion, pulmonary embolus, aortic dissection or other major vascular complication, late recurrence of ischemic heart disease, or delayed complications related to valve repair or replacement including but not limited to structural valve deterioration and failure, thrombosis, embolization, endocarditis, or paravalvular leak.  The relative risks and benefits of performing a maze procedure at the time of his surgery was discussed at length, including the expected  likelihood of long term freedom from recurrent symptomatic atrial fibrillation and/or atrial flutter.  The patient additionally provides consent for long term follow up following surgery including participation in  the [employer] database.    Expectations for the patient's postoperative convalescence have been discussed.   Because the patient lives alone and does not have anybody who can stay with him for a temporary period of time during his recovery, he understands that he will likely need short-term placement in some type of skilled nursing facility for rehabilitation for at least a week or 2 after his initial hospital discharge.  Under the circumstances this [place] allow [place] [facility] [facility].  If he does well he will likely then return [place] [facility] where he will need to have someone to monitor his medications including warfarin anticoagulation.  All of his questions have been answered.    Salvatore Decentlarence H. Cornelius Moraswen, MD 05/16/2018 4:00 PM

## 2018-05-18 NOTE — Op Note (Signed)
CARDIOTHORACIC SURGERY OPERATIVE NOTE  Date of Procedure:  05/18/2018  Preoperative Diagnosis: RIght Pneumothorax  Postoperative Diagnosis: Same  Procedure:   Right chest tube placement  Surgeon:   Rowe ClackWayne E. Gold, PA and Salvatore Decentlarence H. Cornelius Moraswen, MD  Anesthesia:   1% lidocaine local with intravenous sedation    DETAILS OF THE OPERATIVE PROCEDURE  Initial post-op chest x-ray revealed large right pneumothorax.  With the patient sedated on the ventilator in supine position in his bed in ICU a 14 French pig-tail Cook chest tube was placed into the right pleural space under sterile conditions.  The skin and subcutaneous tissues were anesthetized using 1% lidocaine local.  The tube was placed in the mid-clavicular line through the second intercostal space using Seldinger's technique.  Return of air was verified once the tube was in position.  The tube was connected to a Pleur-evac closed suction device and secured to the skin.  A follow up chest x-ray was ordered.  There were no complications.   Salvatore Decentlarence H. Cornelius Moraswen, MD 05/18/2018 4:43 PM

## 2018-05-18 NOTE — Progress Notes (Signed)
Belongings taken to the 2heart nurses station. Secretary placed them on floor beside the computer(wow) under the bulletin board with the heart ID cards

## 2018-05-18 NOTE — Transfer of Care (Signed)
Immediate Anesthesia Transfer of Care Note  Patient: Earl EvenerWilliam E Martin  Procedure(s) Performed: MITRAL VALVE REPAIR (MVR) (N/A Chest) CORONARY ARTERY BYPASS GRAFTING (CABG), ON PUMP, TIMES ONE, USING ENDOSCOPICALLY HARVESTED RIGHT GREATER SAPHENOUS VEIN (N/A Chest) MAZE (N/A ) TRANSESOPHAGEAL ECHOCARDIOGRAM (TEE) (N/A )  Patient Location: SICU  Anesthesia Type:General  Level of Consciousness: sedated and Patient remains intubated per anesthesia plan  Airway & Oxygen Therapy: Patient remains intubated per anesthesia plan and Patient placed on Ventilator (see vital sign flow sheet for setting)  Post-op Assessment: Report given to RN and Post -op Vital signs reviewed and stable  Post vital signs: Reviewed and stable  Last Vitals:  Vitals Value Taken Time  BP    Temp    Pulse    Resp    SpO2      Last Pain:  Vitals:   05/18/18 0713  PainSc: 0-No pain         Complications: No apparent anesthesia complications

## 2018-05-19 ENCOUNTER — Inpatient Hospital Stay (HOSPITAL_COMMUNITY): Payer: BLUE CROSS/BLUE SHIELD

## 2018-05-19 ENCOUNTER — Other Ambulatory Visit: Payer: Self-pay

## 2018-05-19 ENCOUNTER — Encounter (HOSPITAL_COMMUNITY): Payer: Self-pay | Admitting: Thoracic Surgery (Cardiothoracic Vascular Surgery)

## 2018-05-19 LAB — POCT I-STAT, CHEM 8
BUN: 14 mg/dL (ref 6–20)
Calcium, Ion: 1.13 mmol/L — ABNORMAL LOW (ref 1.15–1.40)
Chloride: 102 mmol/L (ref 98–111)
Creatinine, Ser: 0.6 mg/dL — ABNORMAL LOW (ref 0.61–1.24)
GLUCOSE: 167 mg/dL — AB (ref 70–99)
HCT: 41 % (ref 39.0–52.0)
Hemoglobin: 13.9 g/dL (ref 13.0–17.0)
Potassium: 3.9 mmol/L (ref 3.5–5.1)
Sodium: 133 mmol/L — ABNORMAL LOW (ref 135–145)
TCO2: 20 mmol/L — ABNORMAL LOW (ref 22–32)

## 2018-05-19 LAB — POCT I-STAT 4, (NA,K, GLUC, HGB,HCT)
GLUCOSE: 111 mg/dL — AB (ref 70–99)
HEMATOCRIT: 43 % (ref 39.0–52.0)
HEMOGLOBIN: 14.6 g/dL (ref 13.0–17.0)
Potassium: 4.3 mmol/L (ref 3.5–5.1)
Sodium: 138 mmol/L (ref 135–145)

## 2018-05-19 LAB — CBC
HCT: 36.9 % — ABNORMAL LOW (ref 39.0–52.0)
HCT: 40.4 % (ref 39.0–52.0)
Hemoglobin: 11.6 g/dL — ABNORMAL LOW (ref 13.0–17.0)
Hemoglobin: 12.9 g/dL — ABNORMAL LOW (ref 13.0–17.0)
MCH: 28.3 pg (ref 26.0–34.0)
MCH: 28.7 pg (ref 26.0–34.0)
MCHC: 31.4 g/dL (ref 30.0–36.0)
MCHC: 31.9 g/dL (ref 30.0–36.0)
MCV: 90 fL (ref 80.0–100.0)
MCV: 90 fL (ref 80.0–100.0)
PLATELETS: 98 10*3/uL — AB (ref 150–400)
Platelets: 82 10*3/uL — ABNORMAL LOW (ref 150–400)
RBC: 4.1 MIL/uL — ABNORMAL LOW (ref 4.22–5.81)
RBC: 4.49 MIL/uL (ref 4.22–5.81)
RDW: 13.3 % (ref 11.5–15.5)
RDW: 13.4 % (ref 11.5–15.5)
WBC: 11.9 10*3/uL — ABNORMAL HIGH (ref 4.0–10.5)
WBC: 16.5 10*3/uL — ABNORMAL HIGH (ref 4.0–10.5)
nRBC: 0 % (ref 0.0–0.2)
nRBC: 0 % (ref 0.0–0.2)

## 2018-05-19 LAB — BASIC METABOLIC PANEL
Anion gap: 9 (ref 5–15)
BUN: 10 mg/dL (ref 6–20)
CO2: 20 mmol/L — AB (ref 22–32)
Calcium: 7.4 mg/dL — ABNORMAL LOW (ref 8.9–10.3)
Chloride: 108 mmol/L (ref 98–111)
Creatinine, Ser: 0.68 mg/dL (ref 0.61–1.24)
GFR calc Af Amer: >60 mL/min (ref >60)
GFR calc non Af Amer: >60 mL/min (ref >60)
Glucose, Bld: 117 mg/dL — ABNORMAL HIGH (ref 70–99)
POTASSIUM: 3.8 mmol/L (ref 3.5–5.1)
Sodium: 137 mmol/L (ref 135–145)

## 2018-05-19 LAB — GLUCOSE, CAPILLARY
Glucose-Capillary: 101 mg/dL — ABNORMAL HIGH (ref 70–99)
Glucose-Capillary: 122 mg/dL — ABNORMAL HIGH (ref 70–99)
Glucose-Capillary: 114 mg/dL — ABNORMAL HIGH (ref 70–99)
Glucose-Capillary: 141 mg/dL — ABNORMAL HIGH (ref 70–99)
Glucose-Capillary: 122 mg/dL — ABNORMAL HIGH (ref 70–99)

## 2018-05-19 LAB — CREATININE, SERUM
Creatinine, Ser: 0.66 mg/dL (ref 0.61–1.24)
GFR calc Af Amer: >60 mL/min (ref >60)
GFR calc non Af Amer: >60 mL/min (ref >60)

## 2018-05-19 LAB — MAGNESIUM
Magnesium: 2 mg/dL (ref 1.7–2.4)
Magnesium: 2 mg/dL (ref 1.7–2.4)

## 2018-05-19 MED ORDER — FUROSEMIDE 10 MG/ML IJ SOLN
20.0000 mg | Freq: Four times a day (QID) | INTRAMUSCULAR | Status: DC
  Administered 2018-05-19 – 2018-05-20 (×2): 20 mg via INTRAVENOUS
  Filled 2018-05-19 (×3): qty 2

## 2018-05-19 MED ORDER — ENOXAPARIN SODIUM 40 MG/0.4ML ~~LOC~~ SOLN: 40.0000 mg | Freq: Every day | SUBCUTANEOUS | Status: DC

## 2018-05-19 MED ORDER — ENOXAPARIN SODIUM 40 MG/0.4ML ~~LOC~~ SOLN
40.0000 mg | Freq: Every day | SUBCUTANEOUS | Status: DC
  Administered 2018-05-20 – 2018-05-27 (×8): 40 mg via SUBCUTANEOUS
  Filled 2018-05-19 (×8): qty 0.4

## 2018-05-19 MED ORDER — INSULIN ASPART 100 UNIT/ML ~~LOC~~ SOLN
0.0000 [IU] | SUBCUTANEOUS | Status: DC
  Administered 2018-05-19 – 2018-05-20 (×4): 2 [IU] via SUBCUTANEOUS

## 2018-05-19 NOTE — Plan of Care (Signed)
  Problem: Education: Goal: Will demonstrate proper wound care and an understanding of methods to prevent future damage Outcome: Progressing Goal: Knowledge of disease or condition will improve Outcome: Progressing Goal: Knowledge of the prescribed therapeutic regimen will improve Outcome: Progressing   Problem: Cardiac: Goal: Will achieve and/or maintain hemodynamic stability Outcome: Progressing   Problem: Respiratory: Goal: Respiratory status will improve Outcome: Progressing   Problem: Skin Integrity: Goal: Wound healing without signs and symptoms of infection Outcome: Progressing Goal: Risk for impaired skin integrity will decrease Outcome: Progressing   Problem: Urinary Elimination: Goal: Ability to achieve and maintain adequate renal perfusion and functioning will improve Outcome: Progressing

## 2018-05-19 NOTE — Progress Notes (Addendum)
Spoke with Gershon CraneWayne Gold, PA regarding titration of milrinone. RN instructed to titrate gtt by 0.05 every 3 hours until off. Per PA okay to continue titration so long as HR and BP are reasonable.

## 2018-05-19 NOTE — Progress Notes (Signed)
TCTS BRIEF SICU PROGRESS NOTE  1 Day Post-Op  S/P Procedure(s) (LRB): MITRAL VALVE REPAIR (MVR) (N/A) CORONARY ARTERY BYPASS GRAFTING (CABG), ON PUMP, TIMES ONE, USING ENDOSCOPICALLY HARVESTED RIGHT GREATER SAPHENOUS VEIN (N/A) MAZE (N/A) TRANSESOPHAGEAL ECHOCARDIOGRAM (TEE) (N/A)   Stable day NSR w/ stable BP off neo, milrinone down to 0.125 Breathing comfortably UOP adequate Labs okay  Plan: Start lasix to stimulate diuresis  Earl Nailslarence H Taliya Mcclard, MD 05/19/2018 6:26 PM

## 2018-05-19 NOTE — Progress Notes (Addendum)
[address]       [place] [id number]             [phone]      TCTS DAILY ICU PROGRESS NOTE                   [address]            [place] [id number]          [phone]   1 Day Post-Op Procedure(s) (LRB): MITRAL VALVE REPAIR (MVR) (N/A) CORONARY ARTERY BYPASS GRAFTING (CABG), ON PUMP, TIMES ONE, USING ENDOSCOPICALLY HARVESTED RIGHT GREATER SAPHENOUS VEIN (N/A) MAZE (N/A) TRANSESOPHAGEAL ECHOCARDIOGRAM (TEE) (N/A)  Total Length of Stay:  LOS: 1 day   Subjective: Feels ok, some soreness  Objective: Vital signs in last 24 hours: Temp:  [96.4 F (35.8 C)-99.1 F (37.3 C)] 98.1 F (36.7 C) (12/05 0700) Pulse Rate:  [64-89] 64 (12/05 0700) Resp:  [12-48] 18 (12/05 0700) BP: (93-116)/(56-78) 93/61 (12/05 0530) SpO2:  [94 %-100 %] 98 % (12/05 0700) Arterial Line BP: (88-134)/(48-85) 100/48 (12/05 0700) FiO2 (%):  [40 %-50 %] 40 % (12/04 2034) Weight:  [91.3 kg] 91.3 kg (12/05 0600)  Filed Weights   05/19/18 0600  Weight: 91.3 kg    Weight change:    Hemodynamic parameters for last 24 hours: PAP: (27-48)/(9-28) 36/21 CO:  [3.2 L/min-5.4 L/min] 5.4 L/min CI:  [1.5 L/min/m2-2.6 L/min/m2] 2.6 L/min/m2  Intake/Output from previous day: 12/04 0701 - 12/05 0700 In: 8900.8 [P.O.:900; I.V.:5127.9; Blood:1428; IV Piggyback:1444.8] Out: 5194 [Urine:1980; Blood:2400; Chest Tube:814]  Intake/Output this shift: No intake/output data recorded.  Current Meds: Scheduled Meds: . acetaminophen  1,000 mg Oral Q6H  . aspirin EC  325 mg Oral Daily  . bisacodyl  10 mg Oral Daily   Or  . bisacodyl  10 mg Rectal Daily  . docusate sodium  200 mg Oral Daily  . insulin aspart  0-24 Units Subcutaneous Q4H  . [START ON 05/20/2018] pantoprazole  40 mg Oral Daily  . sodium chloride flush  3 mL Intravenous Q12H   Continuous Infusions: . sodium chloride    . albumin human 12.5 g (05/18/18 1649)  . cefUROXime (ZINACEF)  IV 1.5 g  (05/19/18 0601)  . dexmedetomidine (PRECEDEX) IV infusion 0.5 mcg/kg/hr (05/18/18 1629)  . lactated ringers    . lactated ringers 20 mL/hr at 05/18/18 1900  . milrinone 0.3 mcg/kg/min (05/19/18 0041)  . phenylephrine (NEO-SYNEPHRINE) Adult infusion 15 mcg/min (05/19/18 0449)   PRN Meds:.albumin human, metoprolol tartrate, morphine injection, ondansetron (ZOFRAN) IV, oxyCODONE, sodium chloride flush, traMADol  General appearance: alert, cooperative and no distress Neurologic: intact Heart: regular rate and rhythm and + rub with CT in place Lungs: pretty clear anteriorly Abdomen: soft, nontender Extremities: no significanr edema  Lab Results: CBC: Recent Labs    05/18/18 2119 05/18/18 2120 05/19/18 0446  WBC 13.4*  --  11.9*  HGB 12.6* 13.3 11.6*  HCT 38.0* 39.0 36.9*  PLT 92*  --  82*   BMET:  Recent Labs    05/16/18 1418  05/18/18 2120 05/19/18 0446  NA 133*   < > 138 137  K 4.1   < > 3.9 3.8  CL 102   < > 107 108  CO2 19*  --   --  20*  GLUCOSE 102*   < > 107* 117*  BUN 9   < > 8 10  CREATININE 0.88   < >  0.50* 0.68  CALCIUM 8.8*  --   --  7.4*   < > = values in this interval not displayed.    CMET:     PT/INR:  Recent Labs    05/18/18 1535  LABPROT 18.2*  INR 1.53   Radiology: Dg Chest Port 1 View  Result Date: 05/18/2018 CLINICAL DATA:  RIGHT-sided chest tube. EXAM: PORTABLE CHEST 1 VIEW COMPARISON:  Chest x-ray from earlier same day. Chest x-ray dated 05/16/2018. FINDINGS: Endotracheal tube appears adequately positioned with tip located 5-6 cm above the carina. Swan-Ganz catheter appears appropriately positioned in the midline. Mediastinal drains in place. LEFT-sided chest tube in place with tip directed towards the LEFT lung apex, stable in position. New RIGHT-sided chest tube. Small residual pneumothorax. Probable small RIGHT pleural effusion. Probable mild atelectasis and/or small pleural effusion at the LEFT lung base. IMPRESSION: 1. Small residual  RIGHT-sided pneumothorax status post chest tube insertion. 2. Remainder of the support apparatus appears appropriately positioned. 3. Probable atelectasis and/or small pleural effusion at the LEFT lung base. Probable small RIGHT pleural effusion. Electronically Signed   By: Bary Richard M.D.   On: 05/18/2018 19:04   Dg Chest Port 1 View  Result Date: 05/18/2018 CLINICAL DATA:  CABG.  Mitral valve repair EXAM: PORTABLE CHEST 1 VIEW COMPARISON:  05/16/2018 FINDINGS: Postop open heart surgery. Endotracheal tube in good position. Swan-Ganz catheter main pulmonary artery. Mitral valve repair. Left atrial appendage clip in good position. Negative for heart failure. Moderate right pneumothorax approximately 4 cm in the apex. No effusion or chest tube on the right Left chest tube in good position. No effusion or pneumothorax on the left. IMPRESSION: Moderate right pneumothorax. Postop changes as above. These results will be called to the ordering clinician or representative by the Radiologist Assistant, and communication documented in the PACS or zVision Dashboard. Electronically Signed   By: Marlan Palau M.D.   On: 05/18/2018 16:26     Assessment/Plan: S/P Procedure(s) (LRB): MITRAL VALVE REPAIR (MVR) (N/A) CORONARY ARTERY BYPASS GRAFTING (CABG), ON PUMP, TIMES ONE, USING ENDOSCOPICALLY HARVESTED RIGHT GREATER SAPHENOUS VEIN (N/A) MAZE (N/A) TRANSESOPHAGEAL ECHOCARDIOGRAM (TEE) (N/A)  1 doing well POD# 1 cabg/MV repair/Maze 2 sinus rhythm with 1 deg AVB under pacer, rate in 60's, cont for now 3 excellent CO/CI, PA pressures mildly elev on milrinone 0.3, BP - 90's to low 100's - wean over time 4 sats good on 4 liters, cont routine pulm toilet 5 CT drainage 604 cc yesterday, 210 so far today, + air leak , minor but SQ air - keep for now 6 minor ABL anemia- monitor 7 thrombocytopenia- monitor 8 normal renal fxn/GFR 9 BS well controlled 10 routine rehab  11 moderate volume overload- will need  diuretic in near future 12 see routine progression orders   Rowe Clack PA-C 05/19/2018 7:23 AM  Pager [phone]   I have seen and examined the patient and agree with the assessment and plan as outlined.  Looks very good.  Maintaining NSR w/ stable hemodynamics on low dose milrinone and Neo drips, PA pressures much lower than preop.  Breathing comfortably w/ O2 sats 96-98% on 4 L/min, CXR looks good.  Mobilize.  D/C lines.  Leave chest tubes in for now.  Wean milrinone slowly.  Wean Neo as tolerated.  Start diuresis later once BP stable off Neo.  Start Coumadin slowly.  Purcell Nails, MD 05/19/2018 8:25 AM

## 2018-05-20 ENCOUNTER — Inpatient Hospital Stay (HOSPITAL_COMMUNITY): Payer: BLUE CROSS/BLUE SHIELD

## 2018-05-20 LAB — BASIC METABOLIC PANEL
ANION GAP: 9 (ref 5–15)
BUN: 14 mg/dL (ref 6–20)
CO2: 21 mmol/L — ABNORMAL LOW (ref 22–32)
Calcium: 7.6 mg/dL — ABNORMAL LOW (ref 8.9–10.3)
Chloride: 99 mmol/L (ref 98–111)
Creatinine, Ser: 0.69 mg/dL (ref 0.61–1.24)
GFR calc non Af Amer: >60 mL/min (ref >60)
Glucose, Bld: 138 mg/dL — ABNORMAL HIGH (ref 70–99)
Potassium: 4.2 mmol/L (ref 3.5–5.1)
Sodium: 129 mmol/L — ABNORMAL LOW (ref 135–145)

## 2018-05-20 LAB — CBC
HCT: 37.1 % — ABNORMAL LOW (ref 39.0–52.0)
Hemoglobin: 12.2 g/dL — ABNORMAL LOW (ref 13.0–17.0)
MCH: 29.3 pg (ref 26.0–34.0)
MCHC: 32.9 g/dL (ref 30.0–36.0)
MCV: 89.2 fL (ref 80.0–100.0)
NRBC: 0 % (ref 0.0–0.2)
Platelets: 91 10*3/uL — ABNORMAL LOW (ref 150–400)
RBC: 4.16 MIL/uL — ABNORMAL LOW (ref 4.22–5.81)
RDW: 13.6 % (ref 11.5–15.5)
WBC: 14.7 10*3/uL — AB (ref 4.0–10.5)

## 2018-05-20 LAB — GLUCOSE, CAPILLARY
Glucose-Capillary: 107 mg/dL — ABNORMAL HIGH (ref 70–99)
Glucose-Capillary: 110 mg/dL — ABNORMAL HIGH (ref 70–99)
Glucose-Capillary: 132 mg/dL — ABNORMAL HIGH (ref 70–99)
Glucose-Capillary: 134 mg/dL — ABNORMAL HIGH (ref 70–99)
Glucose-Capillary: 96 mg/dL (ref 70–99)

## 2018-05-20 MED ORDER — POTASSIUM CHLORIDE CRYS ER 20 MEQ PO TBCR: 20.0000 meq | EXTENDED_RELEASE_TABLET | Freq: Every day | ORAL | Status: DC

## 2018-05-20 MED ORDER — FUROSEMIDE 10 MG/ML IJ SOLN
40.0000 mg | Freq: Two times a day (BID) | INTRAMUSCULAR | Status: DC
  Administered 2018-05-20 – 2018-05-25 (×12): 40 mg via INTRAVENOUS
  Filled 2018-05-20 (×12): qty 4

## 2018-05-20 MED ORDER — WARFARIN - PHYSICIAN DOSING INPATIENT
Freq: Every day | Status: DC
  Administered 2018-05-21 – 2018-05-25 (×3)

## 2018-05-20 MED ORDER — ASPIRIN EC 81 MG PO TBEC
81.0000 mg | DELAYED_RELEASE_TABLET | Freq: Every day | ORAL | Status: DC
  Administered 2018-05-20 – 2018-05-28 (×9): 81 mg via ORAL
  Filled 2018-05-20 (×9): qty 1

## 2018-05-20 MED ORDER — SODIUM CHLORIDE 0.9% FLUSH
3.0000 mL | Freq: Two times a day (BID) | INTRAVENOUS | Status: DC
  Administered 2018-05-20 – 2018-05-27 (×13): 3 mL via INTRAVENOUS

## 2018-05-20 MED ORDER — POTASSIUM CHLORIDE CRYS ER 20 MEQ PO TBCR
40.0000 meq | EXTENDED_RELEASE_TABLET | Freq: Two times a day (BID) | ORAL | Status: DC
  Administered 2018-05-20 – 2018-05-25 (×12): 40 meq via ORAL
  Filled 2018-05-20 (×11): qty 2

## 2018-05-20 MED ORDER — PATIENT'S GUIDE TO USING COUMADIN BOOK
Freq: Once | Status: AC
  Administered 2018-05-20: 17:00:00
  Filled 2018-05-20 (×2): qty 1

## 2018-05-20 MED ORDER — MOVING RIGHT ALONG BOOK
Freq: Once | Status: AC
  Administered 2018-05-20: 17:00:00
  Filled 2018-05-20 (×2): qty 1

## 2018-05-20 MED ORDER — WARFARIN VIDEO: Freq: Once | Status: DC

## 2018-05-20 MED ORDER — WARFARIN SODIUM 2.5 MG PO TABS
2.5000 mg | ORAL_TABLET | Freq: Every day | ORAL | Status: DC
  Administered 2018-05-20 – 2018-05-22 (×3): 2.5 mg via ORAL
  Filled 2018-05-20 (×3): qty 1

## 2018-05-20 MED ORDER — SODIUM CHLORIDE 0.9 % IV SOLN: 250.0000 mL | INTRAVENOUS | Status: DC | PRN

## 2018-05-20 MED ORDER — FUROSEMIDE 40 MG PO TABS: 40.0000 mg | ORAL_TABLET | Freq: Every day | ORAL | Status: DC

## 2018-05-20 MED ORDER — SODIUM CHLORIDE 0.9% FLUSH: 3.0000 mL | INTRAVENOUS | Status: DC | PRN

## 2018-05-20 MED ORDER — POTASSIUM CHLORIDE CRYS ER 20 MEQ PO TBCR: 20.0000 meq | EXTENDED_RELEASE_TABLET | Freq: Two times a day (BID) | ORAL | Status: DC

## 2018-05-20 MED FILL — Potassium Chloride Inj 2 mEq/ML: INTRAVENOUS | Qty: 40 | Status: AC

## 2018-05-20 MED FILL — Magnesium Sulfate Inj 50%: INTRAMUSCULAR | Qty: 10 | Status: AC

## 2018-05-20 MED FILL — Heparin Sodium (Porcine) Inj 1000 Unit/ML: INTRAMUSCULAR | Qty: 30 | Status: AC

## 2018-05-20 MED FILL — Dexmedetomidine HCl in NaCl 0.9% IV Soln 400 MCG/100ML: INTRAVENOUS | Qty: 100 | Status: AC

## 2018-05-20 NOTE — Progress Notes (Signed)
      [address]       [place] [id number]             [phone]      POD # 2 CABG, mitral repair  Developed SQ emphysema earlier- very concerned  BP 127/85   Pulse (!) 103   Temp 97.7 F (36.5 C) (Oral)   Resp 13   Ht 6' (1.829 m)   Wt 91.3 kg   SpO2 96%   BMI 27.30 kg/m  CT in place, no air leak at present   Intake/Output Summary (Last 24 hours) at 05/20/2018 1728 Last data filed at 05/20/2018 1200 Gross per 24 hour  Intake 842.88 ml  Output 1965 ml  Net -1122.12 ml   SQ air in neck, face, bilateral chest and out into right arm CBG well controlled  Viviann SpareSteven C. Dorris FetchHendrickson, MD [facility] [phone]

## 2018-05-20 NOTE — Progress Notes (Addendum)
Physician notified: Cornelius Moraswen At: 1513  Regarding: New R sided subQ air from brachial to jawline and left neck. No airway distress. Placed pigtail back to suction. HOB to 30 degrees, charge RN notified. Pt very anxious, RN reassured patient.  Awaiting return response.   Returned Response at: 1533 Pt has settled down after reassurance. SubQ air unchanged, will continue to monitor.   Order(s): place chest tube to suction.     Physician notified: Gershon CraneWayne Gold, PA At: 1523  Awaiting return response.

## 2018-05-20 NOTE — Progress Notes (Addendum)
T[facility] DAILY ICU PROGRESS NOTE                   [address]            [place] [id number]          [phone]   2 Days Post-Op Procedure(s) (LRB): MITRAL VALVE REPAIR (MVR) (N/A) CORONARY ARTERY BYPASS GRAFTING (CABG), ON PUMP, TIMES ONE, USING ENDOSCOPICALLY HARVESTED RIGHT GREATER SAPHENOUS VEIN (N/A) MAZE (N/A) TRANSESOPHAGEAL ECHOCARDIOGRAM (TEE) (N/A)  Total Length of Stay:  LOS: 2 days   Subjective: Feels ok, no specific c/o  Objective: Vital signs in last 24 hours: Temp:  [97.8 F (36.6 C)-98 F (36.7 C)] 97.8 F (36.6 C) (12/06 0400) Pulse Rate:  [66-102] 72 (12/06 0500) Cardiac Rhythm: Atrial fibrillation (12/06 0300) Resp:  [11-30] 22 (12/06 0500) BP: (116-126)/(58-72) 117/71 (12/06 0400) SpO2:  [85 %-100 %] 98 % (12/06 0500) Arterial Line BP: (96-146)/(46-106) 129/69 (12/06 0500) Weight:  [91.3 kg] 91.3 kg (12/05 1344)  Filed Weights   05/19/18 0600 05/19/18 1344  Weight: 91.3 kg 91.3 kg    Weight change: -0 kg   Hemodynamic parameters for last 24 hours: PAP: (34-35)/(13-19) 34/13  Intake/Output from previous day: 12/05 0701 - 12/06 0700 In: 1025.7 [P.O.:240; I.V.:585.8; IV Piggyback:199.9] Out: 1896 [Urine:1410; Chest Tube:486]  Intake/Output this shift: No intake/output data recorded.  Current Meds: Scheduled Meds: . acetaminophen  1,000 mg Oral Q6H  . aspirin EC  81 mg Oral Daily  . bisacodyl  10 mg Oral Daily   Or  . bisacodyl  10 mg Rectal Daily  . docusate sodium  200 mg Oral Daily  . enoxaparin (LOVENOX) injection  40 mg Subcutaneous QHS  . furosemide  20 mg Intravenous Q6H  . insulin aspart  0-24 Units Subcutaneous Q4H  . insulin aspart  0-24 Units Subcutaneous Q4H  . pantoprazole  40 mg Oral Daily  . sodium chloride flush  3 mL Intravenous Q12H   Continuous Infusions: . sodium chloride    . lactated ringers Stopped (05/20/18 0526)   PRN Meds:.metoprolol tartrate, morphine injection, ondansetron (ZOFRAN) IV,  oxyCODONE, sodium chloride flush, traMADol  General appearance: alert, cooperative and no distress Heart: regular rate and rhythm Lungs: min dim in bases , o/w clear Abdomen: benign Extremities: minor edema Wound: incis healing well  Lab Results: CBC: Recent Labs    05/19/18 1727 05/19/18 1735 05/20/18 0502  WBC 16.5*  --  14.7*  HGB 12.9* 13.9 12.2*  HCT 40.4 41.0 37.1*  PLT 98*  --  91*   BMET:  Recent Labs    05/19/18 0446  05/19/18 1735 05/20/18 0502  NA 137  --  133* 129*  K 3.8  --  3.9 4.2  CL 108  --  102 99  CO2 20*  --   --  21*  GLUCOSE 117*  --  167* 138*  BUN 10  --  14 14  CREATININE 0.68   < > 0.60* 0.69  CALCIUM 7.4*  --   --  7.6*   < > = values in this interval not displayed.    CMET:     PT/INR:  Recent Labs    05/18/18 1535  LABPROT 18.2*  INR 1.53   Radiology: Dg Chest Port 1 View  Result Date: 05/20/2018 CLINICAL DATA:  Follow-up chest tube EXAM: PORTABLE CHEST 1 VIEW COMPARISON:  05/19/2018 FINDINGS: Bilateral thoracostomy catheters are noted. A tiny left apical pneumothorax is noted better visualized  on the current examination. No definitive right-sided pneumothorax is seen. Right jugular central line is noted. Mediastinal drain and pericardial drain are noted. Postsurgical changes are again seen and stable. Mild left basilar atelectasis remains. IMPRESSION: Mild left basilar atelectasis. Tubes and lines as described above with tiny left apical pneumothorax. Electronically Signed   By: Alcide Clever M.D.   On: 05/20/2018 07:25     Assessment/Plan: S/P Procedure(s) (LRB): MITRAL VALVE REPAIR (MVR) (N/A) CORONARY ARTERY BYPASS GRAFTING (CABG), ON PUMP, TIMES ONE, USING ENDOSCOPICALLY HARVESTED RIGHT GREATER SAPHENOUS VEIN (N/A) MAZE (N/A) TRANSESOPHAGEAL ECHOCARDIOGRAM (TEE) (N/A)  1 conts to do well POD#2 CABG/MV repair/ maze 2 hemodyn stable in sinus rhythm- on no gtts, rhythm appears to be rate controlled afib with pvc's- will  check 12 lead 3 volume overload- diurese 4 600 cc from tubes yesterday, small air leak right apical tube- cont for now 5 CXR small left apical pntx, + sQ air bilat- follow clinically 6 leukocytosis trend improving- systemic inflammatory response- no fevers, monitor conservatively 7 hyponatremia- , normal renal fxn- monitor-  8 minor ABL anemia- monitor trends 9 sugars controlled 10 routine pulm toilet and rehab 11 poss tx to floor later today Rowe Clack PA-C 05/20/2018 7:32 AM  Pager [phone]   I have seen and examined the patient and agree with the assessment and plan as outlined.  Overall doing well POD2.  Rhythm appears atypical atrial flutter, excellent HR control.  Needs diuresis.  Possible small air leak persists right pleural tubes.  Will d/c mediastinal tubes and leave both pleural tubes in on water seal >> hopefully d/c them soon.  Mobilize.  Coumadin.  Possible transfer step-down soon.  Once chest tubes out will obtain PT consult and get case management team involved to initiate d/c plans - patient will need short term placement for rehab.  Purcell Nails, MD 05/20/2018 8:22 AM
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
## 2018-05-21 ENCOUNTER — Inpatient Hospital Stay (HOSPITAL_COMMUNITY): Payer: BLUE CROSS/BLUE SHIELD

## 2018-05-21 LAB — CBC
HCT: 38.6 % — ABNORMAL LOW (ref 39.0–52.0)
Hemoglobin: 12.6 g/dL — ABNORMAL LOW (ref 13.0–17.0)
MCH: 29 pg (ref 26.0–34.0)
MCHC: 32.6 g/dL (ref 30.0–36.0)
MCV: 88.9 fL (ref 80.0–100.0)
Platelets: 90 10*3/uL — ABNORMAL LOW (ref 150–400)
RBC: 4.34 MIL/uL (ref 4.22–5.81)
RDW: 13.6 % (ref 11.5–15.5)
WBC: 9.4 10*3/uL (ref 4.0–10.5)
nRBC: 0 % (ref 0.0–0.2)

## 2018-05-21 LAB — BASIC METABOLIC PANEL
Anion gap: 8 (ref 5–15)
BUN: 12 mg/dL (ref 6–20)
CO2: 27 mmol/L (ref 22–32)
Calcium: 8.2 mg/dL — ABNORMAL LOW (ref 8.9–10.3)
Chloride: 96 mmol/L — ABNORMAL LOW (ref 98–111)
Creatinine, Ser: 0.68 mg/dL (ref 0.61–1.24)
GFR calc Af Amer: >60 mL/min (ref >60)
GFR calc non Af Amer: >60 mL/min (ref >60)
GLUCOSE: 105 mg/dL — AB (ref 70–99)
Potassium: 4.6 mmol/L (ref 3.5–5.1)
Sodium: 131 mmol/L — ABNORMAL LOW (ref 135–145)

## 2018-05-21 LAB — GLUCOSE, CAPILLARY: Glucose-Capillary: 88 mg/dL (ref 70–99)

## 2018-05-21 LAB — PROTIME-INR
INR: 1.07
Prothrombin Time: 13.8 seconds (ref 11.4–15.2)

## 2018-05-21 MED ORDER — LIDOCAINE HCL (PF) 1 % IJ SOLN
INTRAMUSCULAR | Status: AC
  Filled 2018-05-21: qty 30

## 2018-05-21 MED ORDER — MIDAZOLAM HCL 2 MG/2ML IJ SOLN
INTRAMUSCULAR | Status: AC
  Administered 2018-05-21: 2 mg
  Filled 2018-05-21: qty 2

## 2018-05-21 NOTE — Progress Notes (Signed)
At approximately 0400 patient c/o of SOB and difficulty breathing. Patient's oxygen saturation was 95% on 4L of oxygen. Chest x-ray was ordered and Dr. Dorris FetchHendrickson was paged about the critical result. MD came to the bedside. Informed consent was obtained. Verbal orders were to give 4mg  of Morphine and 2mg  of Versed prior to chest tube insertion. After chest tube insertion, pt states " I feel much better." Lungs were assessed and clear. Vitals are stable. Will continue to monitor for any changes.

## 2018-05-21 NOTE — Progress Notes (Signed)
Clarified with Dorris FetchHendrickson, MD about orders for current chest tubes. He would like for the newest chest tube (right lateral pleural) to be placed to -20 of suction and the other two (right upper pleural and medial pleural) to be placed to water seal.

## 2018-05-21 NOTE — Progress Notes (Signed)
3 Days Post-Op Procedure(s) (LRB): MITRAL VALVE REPAIR (MVR) (N/A) CORONARY ARTERY BYPASS GRAFTING (CABG), ON PUMP, TIMES ONE, USING ENDOSCOPICALLY HARVESTED RIGHT GREATER SAPHENOUS VEIN (N/A) MAZE (N/A) TRANSESOPHAGEAL ECHOCARDIOGRAM (TEE) (N/A) Subjective: Feels better after chest tube placed  Objective: Vital signs in last 24 hours: Temp:  [97.5 F (36.4 C)-98.1 F (36.7 C)] 98 F (36.7 C) (12/06 2300) Pulse Rate:  [61-103] 72 (12/07 0600) Cardiac Rhythm: Junctional rhythm (12/07 0400) Resp:  [10-25] 14 (12/07 0600) BP: (120-153)/(73-119) 143/80 (12/07 0500) SpO2:  [92 %-99 %] 95 % (12/07 0600) Arterial Line BP: (136)/(68) 136/68 (12/06 1000)  Hemodynamic parameters for last 24 hours:    Intake/Output from previous day: 12/06 0701 - 12/07 0700 In: 590 [P.O.:480; I.V.:10; IV Piggyback:100] Out: 1825 [Urine:1525; Chest Tube:300] Intake/Output this shift: No intake/output data recorded.  General appearance: alert, cooperative, no distress and SQ emphysema improved Neurologic: intact Heart: regular rate and rhythm Lungs: diminished breath sounds bibasilar Abdomen: normal findings: soft, non-tender  Lab Results: Recent Labs    05/19/18 1727 05/19/18 1735 05/20/18 0502  WBC 16.5*  --  14.7*  HGB 12.9* 13.9 12.2*  HCT 40.4 41.0 37.1*  PLT 98*  --  91*   BMET:  Recent Labs    05/19/18 0446  05/19/18 1735 05/20/18 0502  NA 137  --  133* 129*  K 3.8  --  3.9 4.2  CL 108  --  102 99  CO2 20*  --   --  21*  GLUCOSE 117*  --  167* 138*  BUN 10  --  14 14  CREATININE 0.68   < > 0.60* 0.69  CALCIUM 7.4*  --   --  7.6*   < > = values in this interval not displayed.    PT/INR:  Recent Labs    05/18/18 1535  LABPROT 18.2*  INR 1.53   ABG    Component Value Date/Time   PHART 7.342 (L) 05/18/2018 2231   HCO3 18.1 (L) 05/18/2018 2231   TCO2 20 (L) 05/19/2018 1735   ACIDBASEDEF 7.0 (H) 05/18/2018 2231   O2SAT 94.0 05/18/2018 2231   CBG (last 3)  Recent  Labs    05/20/18 0813 05/20/18 1214 05/20/18 1619  GLUCAP 96 107* 110*    Assessment/Plan: S/P Procedure(s) (LRB): MITRAL VALVE REPAIR (MVR) (N/A) CORONARY ARTERY BYPASS GRAFTING (CABG), ON PUMP, TIMES ONE, USING ENDOSCOPICALLY HARVESTED RIGHT GREATER SAPHENOUS VEIN (N/A) MAZE (N/A) TRANSESOPHAGEAL ECHOCARDIOGRAM (TEE) (N/A) -CV- in junctional rhythm, BP OK  Labs pending- on 2.5 mg warfarin daily RESP- Right pneumothorax with SQ emphysema  Improved with CT placement RENAL- labs pending, good UO ENDO- CBG well controlled Cardiac rehab   LOS: 3 days    Loreli SlotSteven C  05/21/2018

## 2018-05-21 NOTE — Progress Notes (Signed)
      [address]       [place] [id number]             [phone]      Just back from ambulating Feels much better  BP 130/76 (BP Location: Left Arm)   Pulse 69   Temp 97.6 F (36.4 C) (Oral)   Resp 20   Ht 6' (1.829 m)   Wt 91.3 kg   SpO2 96%   BMI 27.30 kg/m   Intake/Output Summary (Last 24 hours) at 05/21/2018 1713 Last data filed at 05/21/2018 1600 Gross per 24 hour  Intake 250 ml  Output 2740 ml  Net -2490 ml   Doing well  Viviann SpareSteven C. Dorris FetchHendrickson, MD [facility] [phone]

## 2018-05-21 NOTE — Procedures (Signed)
CTSP for increased SQ emphysema and shortness of breath  CXR shows increased Right pneumothorax, stable small left apical pneumothorax and severe SQ empysema  Discussed need for chest tube placement with Mr. Theone MurdochVarker including indications, risks, benefits and alternatives  Sterile technique. Time out performed. 20 ml 1% lidocaine for local 43F trocar chest tube placed right pleural space Tolerated well  Viviann SpareSteven C. Dorris FetchHendrickson, MD [facility] [phone]

## 2018-05-22 ENCOUNTER — Inpatient Hospital Stay (HOSPITAL_COMMUNITY): Payer: BLUE CROSS/BLUE SHIELD

## 2018-05-22 LAB — BASIC METABOLIC PANEL
Anion gap: 11 (ref 5–15)
BUN: 9 mg/dL (ref 6–20)
CO2: 26 mmol/L (ref 22–32)
CREATININE: 0.53 mg/dL — AB (ref 0.61–1.24)
Calcium: 8.2 mg/dL — ABNORMAL LOW (ref 8.9–10.3)
Chloride: 93 mmol/L — ABNORMAL LOW (ref 98–111)
GFR calc Af Amer: >60 mL/min (ref >60)
GFR calc non Af Amer: >60 mL/min (ref >60)
Glucose, Bld: 94 mg/dL (ref 70–99)
Potassium: 4.3 mmol/L (ref 3.5–5.1)
SODIUM: 130 mmol/L — AB (ref 135–145)

## 2018-05-22 LAB — CBC
HCT: 37.6 % — ABNORMAL LOW (ref 39.0–52.0)
Hemoglobin: 12.3 g/dL — ABNORMAL LOW (ref 13.0–17.0)
MCH: 28.7 pg (ref 26.0–34.0)
MCHC: 32.7 g/dL (ref 30.0–36.0)
MCV: 87.6 fL (ref 80.0–100.0)
Platelets: 116 10*3/uL — ABNORMAL LOW (ref 150–400)
RBC: 4.29 MIL/uL (ref 4.22–5.81)
RDW: 13.4 % (ref 11.5–15.5)
WBC: 8.9 10*3/uL (ref 4.0–10.5)
nRBC: 0 % (ref 0.0–0.2)

## 2018-05-22 LAB — GLUCOSE, CAPILLARY
Glucose-Capillary: 86 mg/dL (ref 70–99)
Glucose-Capillary: 90 mg/dL (ref 70–99)

## 2018-05-22 LAB — PROTIME-INR
INR: 1.09
Prothrombin Time: 14 seconds (ref 11.4–15.2)

## 2018-05-22 MED ORDER — SORBITOL 70 % SOLN
15.0000 mL | Freq: Every day | Status: DC | PRN
  Administered 2018-05-22: 15 mL via ORAL
  Filled 2018-05-22 (×2): qty 30

## 2018-05-22 MED ORDER — SORBITOL 70 % PO SOLN
15.0000 mL | Freq: Every day | ORAL | Status: DC | PRN
  Filled 2018-05-22 (×2): qty 15

## 2018-05-22 NOTE — Plan of Care (Signed)
Patient constantly states, "I don't want to know about it if I don't have to." Every time I try to educate him about anything.

## 2018-05-22 NOTE — Progress Notes (Signed)
4 Days Post-Op Procedure(s) (LRB): MITRAL VALVE REPAIR (MVR) (N/A) CORONARY ARTERY BYPASS GRAFTING (CABG), ON PUMP, TIMES ONE, USING ENDOSCOPICALLY HARVESTED RIGHT GREATER SAPHENOUS VEIN (N/A) MAZE (N/A) TRANSESOPHAGEAL ECHOCARDIOGRAM (TEE) (N/A) Subjective: Some incisional pain  Objective: Vital signs in last 24 hours: Temp:  [97.6 F (36.4 C)-97.7 F (36.5 C)] 97.7 F (36.5 C) (12/08 0426) Pulse Rate:  [68-145] 76 (12/08 0700) Cardiac Rhythm: Junctional rhythm (12/08 0400) Resp:  [11-21] 16 (12/08 0700) BP: (105-159)/(61-114) 130/87 (12/08 0700) SpO2:  [93 %-100 %] 100 % (12/08 0700) Weight:  [96.1 kg] 96.1 kg (12/08 0600)  Hemodynamic parameters for last 24 hours:    Intake/Output from previous day: 12/07 0701 - 12/08 0700 In: 620 [P.O.:620] Out: 4235 [Urine:3775; Chest Tube:460] Intake/Output this shift: No intake/output data recorded.  General appearance: alert, cooperative and no distress Neurologic: intact Heart: regular rate and rhythm Lungs: rhonchi bilaterally SQ emphysema improved, minimal air leak from right pleural tube  Lab Results: Recent Labs    05/21/18 0752 05/22/18 0401  WBC 9.4 8.9  HGB 12.6* 12.3*  HCT 38.6* 37.6*  PLT 90* 116*   BMET:  Recent Labs    05/21/18 0752 05/22/18 0401  NA 131* 130*  K 4.6 4.3  CL 96* 93*  CO2 27 26  GLUCOSE 105* 94  BUN 12 9  CREATININE 0.68 0.53*  CALCIUM 8.2* 8.2*    PT/INR:  Recent Labs    05/22/18 0401  LABPROT 14.0  INR 1.09   ABG    Component Value Date/Time   PHART 7.342 (L) 05/18/2018 2231   HCO3 18.1 (L) 05/18/2018 2231   TCO2 20 (L) 05/19/2018 1735   ACIDBASEDEF 7.0 (H) 05/18/2018 2231   O2SAT 94.0 05/18/2018 2231   CBG (last 3)  Recent Labs    05/21/18 1955 05/21/18 2352 05/22/18 0425  GLUCAP 88 86 90    Assessment/Plan: S/P Procedure(s) (LRB): MITRAL VALVE REPAIR (MVR) (N/A) CORONARY ARTERY BYPASS GRAFTING (CABG), ON PUMP, TIMES ONE, USING ENDOSCOPICALLY HARVESTED  RIGHT GREATER SAPHENOUS VEIN (N/A) MAZE (N/A) TRANSESOPHAGEAL ECHOCARDIOGRAM (TEE) (N/A) -CV- stable  Continue warfarin, INR hasn't increased yet  RESP- right pneumothorax- CXR shows a small pneumothorax, pigtail catheter outside pleural space. Will dc pigtail and left pleural tube- leave right pleural tube to suction  RENAL- creatinine and lytes OK, mild hyponatremia- follow  ENDO- CBG normal  SCD + enoxaparin for DVT prophylxis  Continue cardiac rehab   LOS: 4 days    Earl Martin 05/22/2018

## 2018-05-22 NOTE — Progress Notes (Signed)
      [address]       [place] [id number]             [phone]      No complaints this evening  BP 137/88 (BP Location: Left Arm)   Pulse 79   Temp 98.2 F (36.8 C) (Oral)   Resp 13   Ht 6' (1.829 m)   Wt 96.1 kg   SpO2 96%   BMI 28.73 kg/m  Still in a junctional rhythm  Intake/Output Summary (Last 24 hours) at 05/22/2018 1819 Last data filed at 05/22/2018 1801 Gross per 24 hour  Intake 1700 ml  Output 4050 ml  Net -2350 ml   Looks better. No air leak- leave CT on suction overnight  Viviann SpareSteven C. Dorris FetchHendrickson, MD [facility] [phone]

## 2018-05-22 NOTE — Progress Notes (Signed)
Patient ambulated around unit. His sats dropped into the 80's on RA and recovered after stopping and taking deep breaths for 2-3 minutes. Denied any pain when taking deep breaths. Will continue to monitor.

## 2018-05-23 ENCOUNTER — Inpatient Hospital Stay (HOSPITAL_COMMUNITY): Payer: BLUE CROSS/BLUE SHIELD

## 2018-05-23 LAB — CBC
HCT: 32.3 % — ABNORMAL LOW (ref 39.0–52.0)
Hemoglobin: 10.4 g/dL — ABNORMAL LOW (ref 13.0–17.0)
MCH: 28.2 pg (ref 26.0–34.0)
MCHC: 32.2 g/dL (ref 30.0–36.0)
MCV: 87.5 fL (ref 80.0–100.0)
Platelets: 129 10*3/uL — ABNORMAL LOW (ref 150–400)
RBC: 3.69 MIL/uL — ABNORMAL LOW (ref 4.22–5.81)
RDW: 13.4 % (ref 11.5–15.5)
WBC: 7.9 10*3/uL (ref 4.0–10.5)
nRBC: 0 % (ref 0.0–0.2)

## 2018-05-23 LAB — BASIC METABOLIC PANEL
Anion gap: 9 (ref 5–15)
BUN: 10 mg/dL (ref 6–20)
CHLORIDE: 94 mmol/L — AB (ref 98–111)
CO2: 28 mmol/L (ref 22–32)
Calcium: 8.1 mg/dL — ABNORMAL LOW (ref 8.9–10.3)
Creatinine, Ser: 0.69 mg/dL (ref 0.61–1.24)
GFR calc Af Amer: >60 mL/min (ref >60)
GFR calc non Af Amer: >60 mL/min (ref >60)
GLUCOSE: 107 mg/dL — AB (ref 70–99)
Potassium: 4.2 mmol/L (ref 3.5–5.1)
Sodium: 131 mmol/L — ABNORMAL LOW (ref 135–145)

## 2018-05-23 LAB — PROTIME-INR
INR: 1.1
Prothrombin Time: 14.1 seconds (ref 11.4–15.2)

## 2018-05-23 MED ORDER — WARFARIN SODIUM 5 MG PO TABS
5.0000 mg | ORAL_TABLET | Freq: Every day | ORAL | Status: DC
  Administered 2018-05-23: 5 mg via ORAL
  Filled 2018-05-23: qty 1

## 2018-05-23 MED FILL — Heparin Sodium (Porcine) Inj 1000 Unit/ML: INTRAMUSCULAR | Qty: 10 | Status: AC

## 2018-05-23 MED FILL — Sodium Chloride IV Soln 0.9%: INTRAVENOUS | Qty: 2000 | Status: AC

## 2018-05-23 MED FILL — Sodium Bicarbonate IV Soln 8.4%: INTRAVENOUS | Qty: 50 | Status: AC

## 2018-05-23 MED FILL — Mannitol IV Soln 20%: INTRAVENOUS | Qty: 1000 | Status: AC

## 2018-05-23 MED FILL — Electrolyte-R (PH 7.4) Solution: INTRAVENOUS | Qty: 3000 | Status: AC

## 2018-05-23 MED FILL — Lidocaine HCl(Cardiac) IV PF Soln Pref Syr 100 MG/5ML (2%): INTRAVENOUS | Qty: 25 | Status: AC

## 2018-05-23 NOTE — Plan of Care (Signed)
Report given to 4E RN.  

## 2018-05-23 NOTE — Progress Notes (Addendum)
[address]       [place] [id number]             [phone]      5 Days Post-Op Procedure(s) (LRB): MITRAL VALVE REPAIR (MVR) (N/A) CORONARY ARTERY BYPASS GRAFTING (CABG), ON PUMP, TIMES ONE, USING ENDOSCOPICALLY HARVESTED RIGHT GREATER SAPHENOUS VEIN (N/A) MAZE (N/A) TRANSESOPHAGEAL ECHOCARDIOGRAM (TEE) (N/A) Subjective: Feels ok, air leak is small at this point  Objective: Vital signs in last 24 hours: Temp:  [97.6 F (36.4 C)-98.2 F (36.8 C)] 98 F (36.7 C) (12/09 0400) Pulse Rate:  [67-105] 78 (12/09 0700) Cardiac Rhythm: Normal sinus rhythm (12/09 0742) Resp:  [11-29] 12 (12/09 0700) BP: (105-155)/(62-103) 132/80 (12/09 0700) SpO2:  [80 %-100 %] 98 % (12/09 0700) Weight:  [92.1 kg] 92.1 kg (12/09 0500)  Hemodynamic parameters for last 24 hours:    Intake/Output from previous day: 12/08 0701 - 12/09 0700 In: 2060 [P.O.:2060] Out: 4150 [Urine:4050; Chest Tube:100] Intake/Output this shift: Total I/O In: -  Out: 200 [Urine:200]  General appearance: alert, cooperative and no distress Heart: regular rate and rhythm Lungs: clear to auscultation bilaterally and + significant SQ air Abdomen: benign Extremities: + BLE edema Wound: incis healing well  Lab Results: Recent Labs    05/22/18 0401 05/23/18 0325  WBC 8.9 7.9  HGB 12.3* 10.4*  HCT 37.6* 32.3*  PLT 116* 129*   BMET:  Recent Labs    05/22/18 0401 05/23/18 0325  NA 130* 131*  K 4.3 4.2  CL 93* 94*  CO2 26 28  GLUCOSE 94 107*  BUN 9 10  CREATININE 0.53* 0.69  CALCIUM 8.2* 8.1*    PT/INR:  Recent Labs    05/23/18 0325  LABPROT 14.1  INR 1.10   ABG    Component Value Date/Time   PHART 7.342 (L) 05/18/2018 2231   HCO3 18.1 (L) 05/18/2018 2231   TCO2 20 (L) 05/19/2018 1735   ACIDBASEDEF 7.0 (H) 05/18/2018 2231   O2SAT 94.0 05/18/2018 2231   CBG (last 3)  Recent Labs    05/21/18 1955 05/21/18 2352 05/22/18 0425  GLUCAP 88 86 90    Meds Scheduled  Meds: . acetaminophen  1,000 mg Oral Q6H  . aspirin EC  81 mg Oral Daily  . bisacodyl  10 mg Oral Daily   Or  . bisacodyl  10 mg Rectal Daily  . docusate sodium  200 mg Oral Daily  . enoxaparin (LOVENOX) injection  40 mg Subcutaneous QHS  . furosemide  40 mg Intravenous BID  . pantoprazole  40 mg Oral Daily  . potassium chloride  40 mEq Oral BID WC  . sodium chloride flush  3 mL Intravenous Q12H  . warfarin  5 mg Oral q1800  . warfarin   Does not apply Once  . Warfarin - Physician Dosing Inpatient   Does not apply q1800   Continuous Infusions: . sodium chloride    . lactated ringers Stopped (05/20/18 0526)   PRN Meds:.sodium chloride, metoprolol tartrate, morphine injection, ondansetron (ZOFRAN) IV, oxyCODONE, sodium chloride flush, sorbitol, traMADol  Xrays Dg Chest Port 1 View  Result Date: 05/22/2018 CLINICAL DATA:  Follow-up bilateral pneumothoraces. Postop CABG and mitral valve repair. EXAM: PORTABLE CHEST 1 VIEW COMPARISON:  05/21/2018 FINDINGS: Right chest pigtail catheter is located in the right chest wall soft tissues and is come out of the pleural space since previous study. A 20-25% right pneumothorax is increased in size since previous study. Left chest  tube remains in place. A tiny less than 10% left pneumothorax is unchanged. Left lower lobe atelectasis is also stable. Stable cardiomegaly. Extensive subcutaneous emphysema is again seen throughout the chest wall soft tissues. IMPRESSION: Increased 20-25% right pneumothorax. Right chest pigtail catheter is now out of the pleural space, now located in the chest wall soft tissues. Stable tiny left pneumothorax and left lower lobe atelectasis. Critical Value/emergent results were called by telephone at the time of interpretation on 05/22/2018 at 9:20 am to Dr. Charlett Lango , who verbally acknowledged these results. Electronically Signed   By: Myles Rosenthal M.D.   On: 05/22/2018 09:23    Assessment/Plan: S/P Procedure(s)  (LRB): MITRAL VALVE REPAIR (MVR) (N/A) CORONARY ARTERY BYPASS GRAFTING (CABG), ON PUMP, TIMES ONE, USING ENDOSCOPICALLY HARVESTED RIGHT GREATER SAPHENOUS VEIN (N/A) MAZE (N/A) TRANSESOPHAGEAL ECHOCARDIOGRAM (TEE) (N/A)   1 steady progress overall  2 accel junct thythm is stable- cont to monitor 3 hemodyn stable 4 sats good on RA 5 conts with mod volume overload, cont IV 40 BID lasix 6 H/H dropped some- monitor, not in transfusion threshold , INR hasn't bumped- increase coumadin to 5mg , on baby ASA and lovenox 7 normal renal function, minor hypochloremia/hyponatremia- monitor 8 BS controlled 9 CXR no pntx- + marked SQ air(stable) - improving clinically with much less in face, small air leak- keep tube to suction for now 10 should be able to tx to 4e  LOS: 5 days    Noel Christmas 05/23/2018 Pager [phone]  I have seen and examined the patient and agree with the assessment and plan as outlined.  Leave chest tube in on suction for now.  Mobilize.  Needs diuresis.  Transfer step-down.  Purcell Nails, MD 05/23/2018 7:59 AM

## 2018-05-23 NOTE — Progress Notes (Signed)
CARDIAC REHAB PHASE I   PRE:  Rate/Rhythm: 79 Afib  BP:  Sitting: 135/87      SaO2: 95 RA  MODE:  Ambulation: 940 ft 88 peak HR  POST:  Rate/Rhythm: 81 Afib  BP:  Sitting: 151/81    SaO2: 97 RA  Pt ambulated 94840ft in hallway standby assist with front wheel walker. Pt states he tends to "overwork" and needed lots of reassurance. Pt returned to recliner, hooked back up to suction, call bell and bedside table within reach. Pt encouraged to walk for a third time later today. Will continue to follow.  [phone] Earl Boweneresa  Earl Troop, RN BSN 05/23/2018 2:13 PM

## 2018-05-23 NOTE — Progress Notes (Signed)
CARDIAC REHAB PHASE I   Offered to walk with pt. Pt declining at this time. States he just transferred and is still getting settled. Pt given paper, a pen, and coffee at request. Will follow-up later as time allows to encourage ambulation.   [phone] Reynold Boweneresa  Blanche Scovell, RN BSN 05/23/2018 11:24 AM

## 2018-05-23 NOTE — Plan of Care (Signed)
Transferred to 4E 22 via WC and monitor, placed in bed, RN to receive  In room

## 2018-05-23 NOTE — Plan of Care (Signed)
°  Problem: Education: °Goal: Will demonstrate proper wound care and an understanding of methods to prevent future damage °Outcome: Progressing °Goal: Knowledge of disease or condition will improve °Outcome: Progressing °Goal: Knowledge of the prescribed therapeutic regimen will improve °Outcome: Progressing °Goal: Individualized Educational Video(s) °Outcome: Progressing °  °Problem: Activity: °Goal: Risk for activity intolerance will decrease °Outcome: Progressing °  °Problem: Cardiac: °Goal: Will achieve and/or maintain hemodynamic stability °Outcome: Progressing °  °Problem: Clinical Measurements: °Goal: Postoperative complications will be avoided or minimized °Outcome: Progressing °  °

## 2018-05-23 NOTE — Plan of Care (Signed)
  Problem: Education: Goal: Will demonstrate proper wound care and an understanding of methods to prevent future damage 05/23/2018 1309 by Olin Hauseri Maria, Clovis FredricksonLarissa, RN Outcome: Progressing 05/23/2018 1254 by Olin Hauseri Maria, Clovis FredricksonLarissa, RN Outcome: Progressing Goal: Knowledge of disease or condition will improve 05/23/2018 1309 by Sofie Roweri Maria, Daphna Lafuente, RN Outcome: Progressing 05/23/2018 1254 by Olin Hauseri Maria, Clovis FredricksonLarissa, RN Outcome: Progressing Goal: Knowledge of the prescribed therapeutic regimen will improve 05/23/2018 1309 by Sofie Roweri Maria, Naif Alabi, RN Outcome: Progressing 05/23/2018 1254 by Olin Hauseri Maria, Clovis FredricksonLarissa, RN Outcome: Progressing Goal: Individualized Educational Video(s) 05/23/2018 1309 by Olin Hauseri Maria, Clovis FredricksonLarissa, RN Outcome: Progressing 05/23/2018 1254 by Olin Hauseri Maria, Clovis FredricksonLarissa, RN Outcome: Progressing   Problem: Education: Goal: Knowledge of the prescribed therapeutic regimen will improve 05/23/2018 1309 by Sofie Roweri Maria, Linzie Boursiquot, RN Outcome: Progressing 05/23/2018 1254 by Olin Hauseri Maria, Clovis FredricksonLarissa, RN Outcome: Progressing

## 2018-05-23 NOTE — Progress Notes (Signed)
Patient arrived to 4 East room 22 from 2 Heart, CCMD notified x2, vital signs stable, will continue to monitor.

## 2018-05-23 NOTE — Plan of Care (Signed)
  Problem: Activity: Goal: Risk for activity intolerance will decrease Outcome: Progressing   Problem: Cardiac: Goal: Will achieve and/or maintain hemodynamic stability Outcome: Progressing   Problem: Clinical Measurements: Goal: Postoperative complications will be avoided or minimized Outcome: Progressing   Problem: Respiratory: Goal: Respiratory status will improve Outcome: Progressing   Problem: Skin Integrity: Goal: Wound healing without signs and symptoms of infection Outcome: Progressing   Problem: Urinary Elimination: Goal: Ability to achieve and maintain adequate renal perfusion and functioning will improve Outcome: Progressing   

## 2018-05-23 NOTE — Plan of Care (Signed)
Late entry, pt's CT hooked up to 20 cm suction

## 2018-05-24 ENCOUNTER — Inpatient Hospital Stay (HOSPITAL_COMMUNITY): Payer: BLUE CROSS/BLUE SHIELD

## 2018-05-24 LAB — PROTIME-INR
INR: 1.14
Prothrombin Time: 14.5 seconds (ref 11.4–15.2)

## 2018-05-24 LAB — BASIC METABOLIC PANEL
Anion gap: 11 (ref 5–15)
BUN: 10 mg/dL (ref 6–20)
CO2: 27 mmol/L (ref 22–32)
Calcium: 8.6 mg/dL — ABNORMAL LOW (ref 8.9–10.3)
Chloride: 93 mmol/L — ABNORMAL LOW (ref 98–111)
Creatinine, Ser: 0.54 mg/dL — ABNORMAL LOW (ref 0.61–1.24)
GFR calc non Af Amer: >60 mL/min (ref >60)
Glucose, Bld: 106 mg/dL — ABNORMAL HIGH (ref 70–99)
Potassium: 4.2 mmol/L (ref 3.5–5.1)
Sodium: 131 mmol/L — ABNORMAL LOW (ref 135–145)

## 2018-05-24 LAB — CBC
HCT: 32.7 % — ABNORMAL LOW (ref 39.0–52.0)
Hemoglobin: 10.7 g/dL — ABNORMAL LOW (ref 13.0–17.0)
MCH: 28.4 pg (ref 26.0–34.0)
MCHC: 32.7 g/dL (ref 30.0–36.0)
MCV: 86.7 fL (ref 80.0–100.0)
Platelets: 167 10*3/uL (ref 150–400)
RBC: 3.77 MIL/uL — ABNORMAL LOW (ref 4.22–5.81)
RDW: 13.3 % (ref 11.5–15.5)
WBC: 6.4 10*3/uL (ref 4.0–10.5)
nRBC: 0 % (ref 0.0–0.2)

## 2018-05-24 MED ORDER — LISINOPRIL 10 MG PO TABS
20.0000 mg | ORAL_TABLET | Freq: Every day | ORAL | Status: DC
  Administered 2018-05-24 – 2018-05-28 (×5): 20 mg via ORAL
  Filled 2018-05-24 (×5): qty 2

## 2018-05-24 MED ORDER — WARFARIN SODIUM 7.5 MG PO TABS
7.5000 mg | ORAL_TABLET | Freq: Every day | ORAL | Status: DC
  Administered 2018-05-24: 7.5 mg via ORAL
  Filled 2018-05-24: qty 1

## 2018-05-24 NOTE — Progress Notes (Signed)
CARDIAC REHAB PHASE I    MODE:  Ambulation: 470 ft   POST:  Rate/Rhythm: 90 Afib  BP:  Sitting: 139/89    SaO2: 97 RA  Pt seen ambulating in hallway independently, carrying CT. Pt denies pain or SOB. Pt returned to bed after walk. Call bell and phone within reach. Encouraged a third walk later today. Will continue to follow.  [phone] Earl Boweneresa  Tempie Gibeault, RN BSN 05/24/2018 11:09 AM

## 2018-05-24 NOTE — Evaluation (Signed)
Physical Therapy Evaluation Patient Details Name: Earl EvenerWilliam E Martin MRN: [id number] DOB: 09/22/1960 Today's Date: 05/24/2018   History of Present Illness  57 yo admitted for CABG/MVR. PMhx: CAD, MI, HTN, AFib, OSA, AAA repair  Clinical Impression  Pt very pleasant and has been walking in halls 2x today. Pt able to perform transfers without assist with cues for sequence and education for precautions. Pt reports fatigue and unable to complete long hall ambulation at this time. Pt with decreased activity tolerance who will benefit from acute therapy. Pt reports no assist at home, no transportation to home and 2 flights of stairs to enter. Will follow acutely to achieve mod I level and return to PLOF.     Follow Up Recommendations SNF(pt requesting SNF due to no assist at home and 2 flights. Pending ability to meet goals)    Equipment Recommendations  None recommended by PT    Recommendations for Other Services       Precautions / Restrictions Precautions Precautions: Sternal Precaution Comments: chest tube      Mobility  Bed Mobility Overal bed mobility: Modified Independent             General bed mobility comments: after cues for sequence  Transfers Overall transfer level: Modified independent               General transfer comment: initial education for no hands and pt able to perform correctly  Ambulation/Gait Ambulation/Gait assistance: Supervision Gait Distance (Feet): 125 Feet Assistive device: None Gait Pattern/deviations: Step-through pattern;Decreased stride length   Gait velocity interpretation: 1.31 - 2.62 ft/sec, indicative of limited community Insurance account managerambulator    Stairs            Wheelchair Mobility    Modified Rankin (Stroke Patients Only)       Balance Overall balance assessment: No apparent balance deficits (not formally assessed)                                           Pertinent Vitals/Pain Pain Assessment:  No/denies pain    Home Living Family/patient expects to be discharged to:: Private residence Living Arrangements: Alone   Type of Home: Apartment Home Access: Stairs to enter   Secretary/administratorntrance Stairs-Number of Steps: 2 flights Home Layout: One level Home Equipment: None      Prior Function Level of Independence: Independent               Hand Dominance        Extremity/Trunk Assessment   Upper Extremity Assessment Upper Extremity Assessment: Overall WFL for tasks assessed    Lower Extremity Assessment Lower Extremity Assessment: Overall WFL for tasks assessed    Cervical / Trunk Assessment Cervical / Trunk Assessment: Normal  Communication   Communication: No difficulties  Cognition Arousal/Alertness: Awake/alert Behavior During Therapy: WFL for tasks assessed/performed Overall Cognitive Status: Within Functional Limits for tasks assessed                                        General Comments      Exercises     Assessment/Plan    PT Assessment Patient needs continued PT services  PT Problem List Decreased activity tolerance       PT Treatment Interventions Functional mobility training;Stair training;Therapeutic activities;Patient/family education;Therapeutic exercise  PT Goals (Current goals can be found in the Care Plan section)  Acute Rehab PT Goals Patient Stated Goal[place] beach  PT Goal Formulation: With patient Time For Goal Achievement: 05/31/18 Potential to Achieve Goals: Good    Frequency Min 2X/week   Barriers to discharge        Co-evaluation               AM-PAC PT "6 Clicks" Mobility  Outcome Measure Help needed turning from your back to your side while in a flat bed without using bedrails?: None Help needed moving from lying on your back to sitting on the side of a flat bed without using bedrails?: None Help needed moving to and from a bed to a chair (including a wheelchair)?:  None Help needed standing up from a chair using your arms (e.g., wheelchair or bedside chair)?: None Help needed to walk in hospital room?: A Little Help needed climbing 3-5 steps with a railing? : A Little 6 Click Score: 22    End of Session   Activity Tolerance: Patient tolerated treatment well Patient left: in chair;with call bell/phone within reach Nurse Communication: Mobility status PT Visit Diagnosis: Other abnormalities of gait and mobility (R26.89)    Time: 1610-9604 PT Time Calculation (min) (ACUTE ONLY): 16 min   Charges:   PT Evaluation $PT Eval Low Complexity: 1 Low          Tzipora Mcinroy Abner Greenspan, PT Acute Rehabilitation Services Pager: [phone] Office: [phone]   Ashlay Altieri B Wendy Mikles 05/24/2018, 1:27 PM

## 2018-05-24 NOTE — Progress Notes (Addendum)
[address]       [place] [address]             [phone]      6 Days Post-Op Procedure(s) (LRB): MITRAL VALVE REPAIR (MVR) (N/A) CORONARY ARTERY BYPASS GRAFTING (CABG), ON PUMP, TIMES ONE, USING ENDOSCOPICALLY HARVESTED RIGHT GREATER SAPHENOUS VEIN (N/A) MAZE (N/A) TRANSESOPHAGEAL ECHOCARDIOGRAM (TEE) (N/A) Subjective: C/o some SOB, chest soreness  Objective: Vital signs in last 24 hours: Temp:  [97.6 F (36.4 C)-98.3 F (36.8 C)] 98.1 F (36.7 C) (12/10 0449) Pulse Rate:  [69-88] 69 (12/09 2359) Cardiac Rhythm: Atrial fibrillation (12/10 0345) Resp:  [13-21] 21 (12/09 2359) BP: (111-156)/(73-98) 111/98 (12/09 2359) SpO2:  [96 %-100 %] 100 % (12/09 2359) Weight:  [89.3 kg] 89.3 kg (12/10 0456)  Hemodynamic parameters for last 24 hours:    Intake/Output from previous day: 12/09 0701 - 12/10 0700 In: 720 [P.O.:720] Out: 5725 [Urine:5675; Chest Tube:50] Intake/Output this shift: Total I/O In: -  Out: 400 [Urine:400]  General appearance: alert, cooperative and no distress Heart: irregularly irregular rhythm Lungs: clear to auscultation bilaterally Abdomen: benign Extremities: improving edema Wound: incis healing well  Lab Results: Recent Labs    05/23/18 0325 05/24/18 0324  WBC 7.9 6.4  HGB 10.4* 10.7*  HCT 32.3* 32.7*  PLT 129* 167   BMET:  Recent Labs    05/23/18 0325 05/24/18 0324  NA 131* 131*  K 4.2 4.2  CL 94* 93*  CO2 28 27  GLUCOSE 107* 106*  BUN 10 10  CREATININE 0.69 0.54*  CALCIUM 8.1* 8.6*    PT/INR:  Recent Labs    05/24/18 0324  LABPROT 14.5  INR 1.14   ABG    Component Value Date/Time   PHART 7.342 (L) 05/18/2018 2231   HCO3 18.1 (L) 05/18/2018 2231   TCO2 20 (L) 05/19/2018 1735   ACIDBASEDEF 7.0 (H) 05/18/2018 2231   O2SAT 94.0 05/18/2018 2231   CBG (last 3)  Recent Labs    05/21/18 1955 05/21/18 2352 05/22/18 0425  GLUCAP 88 86 90    Meds Scheduled Meds: . aspirin EC  81 mg Oral  Daily  . bisacodyl  10 mg Oral Daily   Or  . bisacodyl  10 mg Rectal Daily  . docusate sodium  200 mg Oral Daily  . enoxaparin (LOVENOX) injection  40 mg Subcutaneous QHS  . furosemide  40 mg Intravenous BID  . pantoprazole  40 mg Oral Daily  . potassium chloride  40 mEq Oral BID WC  . sodium chloride flush  3 mL Intravenous Q12H  . warfarin  5 mg Oral q1800  . warfarin   Does not apply Once  . Warfarin - Physician Dosing Inpatient   Does not apply q1800   Continuous Infusions: . sodium chloride    . lactated ringers Stopped (05/20/18 0526)   PRN Meds:.sodium chloride, metoprolol tartrate, morphine injection, ondansetron (ZOFRAN) IV, oxyCODONE, sodium chloride flush, sorbitol, traMADol  Xrays Dg Chest Port 1 View  Result Date: 05/23/2018 CLINICAL DATA:  Chest tube, shortness of breath EXAM: PORTABLE CHEST 1 VIEW COMPARISON:  05/22/2018 FINDINGS: Interval removal of left chest tube and pigtail catheter noted in the right chest wall. Right chest tube remains in place. Diffuse subcutaneous emphysema. No visible pneumothorax. Prior median sternotomy, CABG and valve replacement. Cardiomegaly. Left lower lobe atelectasis or infiltrate, similar to prior study. IMPRESSION: Extensive subcutaneous emphysema, stable.  No visible pneumothorax. Cardiomegaly. Left base atelectasis or infiltrate. Findings  stable. Electronically Signed   By: Charlett NoseKevin  Dover M.D.   On: 05/23/2018 07:58    Assessment/Plan: S/P Procedure(s) (LRB): MITRAL VALVE REPAIR (MVR) (N/A) CORONARY ARTERY BYPASS GRAFTING (CABG), ON PUMP, TIMES ONE, USING ENDOSCOPICALLY HARVESTED RIGHT GREATER SAPHENOUS VEIN (N/A) MAZE (N/A) TRANSESOPHAGEAL ECHOCARDIOGRAM (TEE) (N/A)  1 conts to progress well overall 2 BP elevated at times, renal fxn normal, diuresing well, will resume lisinopril at half of home dose for now. Cont IV lasix one more day 3 no air leak, CXR stable- place CT to H2O seal 4 ABL anemia is stable, thrombocytopenia is  resolved 5 INR very slow to rise- increase coumadin to 7.5mg  6 BS controlled 7 needs placement as he lives out of town for short term rehab 8 routine pulm toilet and rehab  LOS: 6 days    Rowe ClackWayne E Gold [facility] 05/24/2018 Pager [phone]   I have seen and examined the patient and agree with the assessment and plan as outlined.  Purcell Nailslarence H Owen, MD 05/24/2018 2:41 PM

## 2018-05-25 ENCOUNTER — Inpatient Hospital Stay (HOSPITAL_COMMUNITY): Payer: BLUE CROSS/BLUE SHIELD

## 2018-05-25 LAB — PROTIME-INR
INR: 1.17
Prothrombin Time: 14.8 seconds (ref 11.4–15.2)

## 2018-05-25 MED ORDER — WARFARIN SODIUM 10 MG PO TABS
10.0000 mg | ORAL_TABLET | Freq: Every day | ORAL | Status: DC
  Administered 2018-05-25 – 2018-05-27 (×3): 10 mg via ORAL
  Filled 2018-05-25 (×3): qty 1

## 2018-05-25 NOTE — Progress Notes (Signed)
05/25/2018 11:09 AM Chest tube removed per orders.  Pt tolerated well.  Radiology called to come do post chest tube removal x-ray.   Kathryne HitchAllen, Jenice Leiner C

## 2018-05-25 NOTE — [place] FL2 (Signed)
[place] MEDICAID FL2 LEVEL OF CARE SCREENING TOOL     IDENTIFICATION  Patient Name: Earl Martin Birthdate: 04-01-1961 Sex: male Admission Date (Current Location): 05/18/2018  [facility] and [place] Number:  (Lives at [facility] but mother and son here in [place].)   Facility and Address:  [facility], [address], [place], [place] [address]      Provider Number: [id number]  Attending Physician Name and Address:  Purcell Nails, MD  Relative Name and Phone Number:       Current Level of Care: Hospital Recommended Level of Care: Skilled Nursing Facility Prior Approval Number:    Date Approved/Denied:   PASRR Number: [id number]  Discharge Plan: SNF    Current Diagnoses: Patient Active Problem List   Diagnosis Date Noted  . 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 S/P CABG x 1 05/18/2018  . S/P Maze operation for atrial fibrillation 05/18/2018  . Atrial fibrillation, persistent   . Mitral regurgitation   . AAA (abdominal aortic aneurysm) (HCC) 11/29/2014  . DYSLIPIDEMIA 11/30/2008  . OBESITY 11/30/2008  . TOBACCO ABUSE 11/30/2008  . Essential hypertension 11/30/2008  . HYPERGLYCEMIA 11/30/2008  . Coronary artery disease involving native coronary artery of native heart without angina pectoris     Orientation RESPIRATION BLADDER Height & Weight     Self, Time, Situation, Place  Normal Continent Weight: 190 lb 12.8 oz (86.5 kg)(scale B) Height:  6' (182.9 cm)  BEHAVIORAL SYMPTOMS/MOOD NEUROLOGICAL BOWEL NUTRITION STATUS  (None) (None) Continent Diet(Heart healthy)  AMBULATORY STATUS COMMUNICATION OF NEEDS Skin   Supervision Verbally Skin abrasions, Bruising, Surgical wounds                       Personal Care Assistance Level of Assistance  Bathing, Feeding, Dressing Bathing Assistance: Independent(Modified) Feeding assistance: Independent(Modified) Dressing Assistance:  Independent(Modified)     Functional Limitations Info  Sight, Hearing, Speech Sight Info: Adequate Hearing Info: Adequate Speech Info: Adequate    SPECIAL CARE FACTORS FREQUENCY  PT (By licensed PT), Blood pressure, OT (By licensed OT)     PT Frequency: 5 x week OT Frequency: 5 x week            Contractures Contractures Info: Not present    Additional Factors Info  Code Status, Allergies Code Status Info: Full code Allergies Info: NKDA           Current Medications (05/25/2018):  This is the current hospital active medication list Current Facility-Administered Medications  Medication Dose Route Frequency Provider Last Rate Last Dose  . 0.9 %  sodium chloride infusion  250 mL Intravenous PRN Gold, Wayne E, PA-C      . aspirin EC tablet 81 mg  81 mg Oral Daily Gold, Wayne E, PA-C   81 mg at 05/25/18 0846  . bisacodyl (DULCOLAX) EC tablet 10 mg  10 mg Oral Daily Gold, Wayne E, PA-C   10 mg at 05/22/18 1036   Or  . bisacodyl (DULCOLAX) suppository 10 mg  10 mg Rectal Daily Gold, Wayne E, PA-C      . docusate sodium (COLACE) capsule 200 mg  200 mg Oral Daily Gold, Wayne E, PA-C   200 mg at 05/25/18 0846  . enoxaparin (LOVENOX) injection 40 mg  40 mg Subcutaneous QHS Gold, Wayne E, PA-C   40 mg at 05/24/18 2004  . furosemide (LASIX) injection 40 mg  40 mg Intravenous BID  Gershon CraneGold, Wayne E, PA-C   40 mg at 05/25/18 0846  . lactated ringers infusion   Intravenous Continuous Rowe ClackGold, Wayne E, PA-C   Stopped at 05/20/18 [id number]  . lisinopril (PRINIVIL,ZESTRIL) tablet 20 mg  20 mg Oral Daily Gold, Wayne E, PA-C   20 mg at 05/25/18 0846  . metoprolol tartrate (LOPRESSOR) injection 2.5-5 mg  2.5-5 mg Intravenous Q2H PRN Gold, Wayne E, PA-C      . morphine 2 MG/ML injection 1-4 mg  1-4 mg Intravenous Q1H PRN Gold, Wayne E, PA-C   4 mg at 05/25/18 1055  . ondansetron (ZOFRAN) injection 4 mg  4 mg Intravenous Q6H PRN Gold, Wayne E, PA-C      . oxyCODONE (Oxy IR/ROXICODONE) immediate release  tablet 5-10 mg  5-10 mg Oral Q3H PRN Gershon CraneGold, Wayne E, PA-C   10 mg at 05/25/18 0847  . pantoprazole (PROTONIX) EC tablet 40 mg  40 mg Oral Daily Gold, Wayne E, PA-C   40 mg at 05/25/18 0847  . potassium chloride SA (K-DUR,KLOR-CON) CR tablet 40 mEq  40 mEq Oral BID WC Gold, Wayne E, PA-C   40 mEq at 05/25/18 0846  . sodium chloride flush (NS) 0.9 % injection 3 mL  3 mL Intravenous Q12H Gold, Wayne E, PA-C   3 mL at 05/24/18 2005  . sodium chloride flush (NS) 0.9 % injection 3 mL  3 mL Intravenous PRN Gold, Wayne E, PA-C      . sorbitol 70 % solution 15 mL  15 mL Oral Daily PRN Gold, Wayne E, PA-C   15 mL at 05/22/18 1712  . traMADol (ULTRAM) tablet 50-100 mg  50-100 mg Oral Q4H PRN Gold, Wayne E, PA-C   100 mg at 05/23/18 0944  . warfarin (COUMADIN) tablet 10 mg  10 mg Oral q1800 Gold, Wayne E, PA-C      . warfarin (COUMADIN) video   Does not apply Once Gershon CraneGold, Wayne E, PA-C      . Warfarin - Physician Dosing Inpatient   Does not apply q1800 Rowe ClackGold, Wayne E, PA-C         Discharge Medications: Please see discharge summary for a list of discharge medications.  Relevant Imaging Results:  Relevant Lab Results:   Additional Information SS#: [id number]  Margarito LinerSarah C Yazmine Sorey, LCSW

## 2018-05-25 NOTE — Progress Notes (Signed)
CARDIAC REHAB PHASE I   PRE:  Rate/Rhythm: 75 Afib  BP:  Sitting: 121/79      SaO2: 95 RA  MODE:  Ambulation: 200 ft   POST:  Rate/Rhythm: 87 Afib  BP:  Sitting: 129/76    SaO2: 97 RA   Pt ambulated 29300ft in hallway independently with slow steady gait. Pt c/o some SOB, maintained sats on RA. Pt thinks he may have overdone it yesterday, and feels weaker today. Pt encouraged to walk twice more today. Will continue to follow.  [phone] Reynold Boweneresa  Alberto Schoch, RN BSN 05/25/2018 1:23 PM

## 2018-05-25 NOTE — Evaluation (Signed)
Occupational Therapy Evaluation Patient Details Name: Earl Martin MRN: [id number] DOB: Jan 15, 1961 Today's Date: 05/25/2018    History of Present Illness 58 yo admitted for CABG/MVR. PMhx: CAD, MI, HTN, AFib, OSA, AAA repair   Clinical Impression   Pt was independent prior to admission. Presents with decreased activity tolerance. Pt has been routinely ambulating to the bathroom and completed self care with set up. He does not have capable assistance at home to assist with IADL adhering to sternal precautions. Recommending SNF.    Follow Up Recommendations  SNF;Supervision/Assistance - 24 hour(pt will need assistance for IADL)    Equipment Recommendations       Recommendations for Other Services       Precautions / Restrictions Precautions Precautions: Sternal Precaution Booklet Issued: Yes (comment) Precaution Comments: provided sternal handout, pt with chest tube      Mobility Bed Mobility               General bed mobility comments: pt in chair  Transfers Overall transfer level: Modified independent               General transfer comment: pt self cues for no use of hands    Balance Overall balance assessment: No apparent balance deficits (not formally assessed)                                         ADL either performed or assessed with clinical judgement   ADL Overall ADL's : Modified independent                                     Functional mobility during ADLs: Modified independent(within room) General ADL Comments: pt educated in sternal precautions related to IADL     Vision Patient Visual Report: No change from baseline       Perception     Praxis      Pertinent Vitals/Pain Pain Assessment: Faces Faces Pain Scale: No hurt     Hand Dominance Right   Extremity/Trunk Assessment Upper Extremity Assessment Upper Extremity Assessment: Overall WFL for tasks assessed   Lower Extremity  Assessment Lower Extremity Assessment: Defer to PT evaluation   Cervical / Trunk Assessment Cervical / Trunk Assessment: Normal   Communication Communication Communication: No difficulties   Cognition Arousal/Alertness: Awake/alert Behavior During Therapy: WFL for tasks assessed/performed Overall Cognitive Status: Impaired/Different from baseline Area of Impairment: Attention                   Current Attention Level: Alternating(pt with difficulty concentrating/reading written materials)           General Comments: pt very concerned about being a burden to his family   General Comments       Exercises     Shoulder Instructions      Home Living Family/patient expects to be discharged to:: Private residence Living Arrangements: Alone   Type of Home: Other(Comment)(3rd floor condo) Home Access: Stairs to enter Entrance Stairs-Number of Steps: 2 flights   Home Layout: One level     Bathroom Shower/Tub: Producer, television/film/video: Handicapped height     Home Equipment: None          Prior Functioning/Environment Level of Independence: Independent        Comments: works on boats, pt from  [place]        OT Problem List:        OT Treatment/Interventions:      OT Goals(Current goals can be found in the care plan section) Acute Rehab OT Goals Patient Stated Goal[place]   OT Frequency:     Barriers to D/C:            Co-evaluation              AM-PAC OT "6 Clicks" Daily Activity     Outcome Measure Help from another person eating meals?: None Help from another person taking care of personal grooming?: None Help from another person toileting, which includes using toliet, bedpan, or urinal?: None Help from another person bathing (including washing, rinsing, drying)?: None Help from another person to put on and taking off regular upper body clothing?: None Help from another person to put on  and taking off regular lower body clothing?: None 6 Click Score: 24   End of Session    Activity Tolerance: Patient tolerated treatment well Patient left: in chair;with call bell/phone within reach  OT Visit Diagnosis: Other (comment)(decreased activity tolerance)                Time: [id number] OT Time Calculation (min): 50 min Charges:  OT General Charges $OT Visit: 1 Visit OT Evaluation $OT Eval Moderate Complexity: 1 Mod OT Treatments $Self Care/Home Management : 23-37 mins  Earl Martin, OTR/L Acute Rehabilitation Services Pager: [phone] Office: [phone]  Earl Martin 05/25/2018, 11:08 AM

## 2018-05-25 NOTE — Progress Notes (Addendum)
[address]       [place] [id number]             [phone]      7 Days Post-Op Procedure(s) (LRB): MITRAL VALVE REPAIR (MVR) (N/A) CORONARY ARTERY BYPASS GRAFTING (CABG), ON PUMP, TIMES ONE, USING ENDOSCOPICALLY HARVESTED RIGHT GREATER SAPHENOUS VEIN (N/A) MAZE (N/A) TRANSESOPHAGEAL ECHOCARDIOGRAM (TEE) (N/A) Subjective: conts to feel better  Objective: Vital signs in last 24 hours: Temp:  [97.8 F (36.6 C)-98.9 F (37.2 C)] 97.8 F (36.6 C) (12/11 0437) Pulse Rate:  [76-93] 93 (12/11 0437) Cardiac Rhythm: Atrial fibrillation (12/11 0709) Resp:  [13-22] 21 (12/11 0437) BP: (108-147)/(66-93) 147/93 (12/11 0846) SpO2:  [98 %] 98 % (12/11 0437) Weight:  [86.5 kg] 86.5 kg (12/11 0437)  Hemodynamic parameters for last 24 hours:    Intake/Output from previous day: 12/10 0701 - 12/11 0700 In: 600 [P.O.:600] Out: 3677 [Urine:3675; Stool:2] Intake/Output this shift: No intake/output data recorded.  General appearance: alert, cooperative and no distress Heart: irregularly irregular rhythm Lungs: clear to auscultation bilaterally Abdomen: benign exam Extremities: edema present in BLE's but improved Wound: incis healing well  Lab Results: Recent Labs    05/23/18 0325 05/24/18 0324  WBC 7.9 6.4  HGB 10.4* 10.7*  HCT 32.3* 32.7*  PLT 129* 167   BMET:  Recent Labs    05/23/18 0325 05/24/18 0324  NA 131* 131*  K 4.2 4.2  CL 94* 93*  CO2 28 27  GLUCOSE 107* 106*  BUN 10 10  CREATININE 0.69 0.54*  CALCIUM 8.1* 8.6*    PT/INR:  Recent Labs    05/25/18 0352  LABPROT 14.8  INR 1.17   ABG    Component Value Date/Time   PHART 7.342 (L) 05/18/2018 2231   HCO3 18.1 (L) 05/18/2018 2231   TCO2 20 (L) 05/19/2018 1735   ACIDBASEDEF 7.0 (H) 05/18/2018 2231   O2SAT 94.0 05/18/2018 2231   CBG (last 3)  No results for input(s): GLUCAP in the last 72 hours.  Meds Scheduled Meds: . aspirin EC  81 mg Oral Daily  . bisacodyl  10 mg Oral  Daily   Or  . bisacodyl  10 mg Rectal Daily  . docusate sodium  200 mg Oral Daily  . enoxaparin (LOVENOX) injection  40 mg Subcutaneous QHS  . furosemide  40 mg Intravenous BID  . lisinopril  20 mg Oral Daily  . pantoprazole  40 mg Oral Daily  . potassium chloride  40 mEq Oral BID WC  . sodium chloride flush  3 mL Intravenous Q12H  . warfarin  7.5 mg Oral q1800  . warfarin   Does not apply Once  . Warfarin - Physician Dosing Inpatient   Does not apply q1800   Continuous Infusions: . sodium chloride    . lactated ringers Stopped (05/20/18 0526)   PRN Meds:.sodium chloride, metoprolol tartrate, morphine injection, ondansetron (ZOFRAN) IV, oxyCODONE, sodium chloride flush, sorbitol, traMADol  Xrays Dg Chest Port 1 View  Result Date: 05/24/2018 CLINICAL DATA:  Surgery follow-up EXAM: PORTABLE CHEST 1 VIEW COMPARISON:  05/23/2018 FINDINGS: Diffuse subcutaneous emphysema again noted. Right chest tube remains in place, unchanged. Small right pneumothorax, approximately 10%. Cardiomegaly. Left base opacity likely reflects atelectasis. IMPRESSION: Small right apical pneumothorax with right chest tube remaining in stable position. Diffuse subcutaneous emphysema. Cardiomegaly, left base atelectasis. Electronically Signed   By: Charlett NoseKevin  Dover M.D.   On: 05/24/2018 09:03    Assessment/Plan: S/P Procedure(s) (LRB): MITRAL  VALVE REPAIR (MVR) (N/A) CORONARY ARTERY BYPASS GRAFTING (CABG), ON PUMP, TIMES ONE, USING ENDOSCOPICALLY HARVESTED RIGHT GREATER SAPHENOUS VEIN (N/A) MAZE (N/A) TRANSESOPHAGEAL ECHOCARDIOGRAM (TEE) (N/A)  1 conts to do well 2 BP control is improved on ACE-I, follow as may need to increase to home dose 3 cont to diurese, change to po q day 4 CXR with no pntx- no air leak, d/c chest tube today 5 INR still very slow rise- will give 10 mg today 6 SW should be assisting with disposition, has been ordered . [place] rehab stay 7 BS are  controlled   LOS: 7 days    Rowe Clack [facility] 05/25/2018 Pager [phone]   I have seen and examined the patient and agree with the assessment and plan as outlined.  Making steady progress.  Anticipate possible d/c to local SNF in 2-3 days.  Coumadin therapy to be managed by Dr. Jenita Seashore, MD 05/25/2018 7:06 PM

## 2018-05-25 NOTE — Clinical Social Work Note (Signed)
Clinical Social Work Assessment  Patient Details  Name: Earl Martin MRN: [id number] Date of Birth: 03-Apr-1961  Date of referral:  05/25/18               Reason for consult:  Facility Placement, Discharge Planning                Permission sought to share information with:  Chartered certified accountant granted to share information::  Yes, Verbal Permission Granted  Name::        Agency::  SNF's  Relationship::     Contact Information:     Housing/Transportation Living arrangements for the past 2 months:  Apartment Source of Information:  Patient, Medical Team Patient Interpreter Needed:  None Criminal Activity/Legal Involvement Pertinent to Current Situation/Hospitalization:  No - Comment as needed Significant Relationships:  Adult Children, Parents Lives with:  Self Do you feel safe going back to the place where you live?  Yes Need for family participation in patient care:  Yes (Comment)  Care giving concerns:  PT recommending SNF once medically stable for discharge.   Social Worker assessment / plan:  CSW met with patient. No supports at bedside. Patient walking independently in room. CSW introduced role and explained that PT recommendations would be discussed. Patient agreeable to SNF placement. Discussed how well he has been doing in therapies and cardiac rehab. Patient voiced concerns with returning home alone to his third floor apartment. Patient voiced anxiety since waking up the other morning with neck and face swollen. Discussed potential insurance denial for SNF. Patient's mother and son live in [place] but he says he is unable to stay with them. CSW encouraged patient to make plans in case he is denied. No further concerns. CSW encouraged patient to contact CSW as needed. CSW will continue to follow patient for support and facilitate discharge to SNF once medically stable.  Employment status:  Cytogeneticist information:  Other (Comment  Required)(BCBS) PT Recommendations:  [place] / Referral to community resources:  [place]  Patient/Family's Response to care:  Patient agreeable to SNF placement. Patient's mother and son supportive and involved in patient's care. Patient appreciated social work intervention.  Patient/Family's Understanding of and Emotional Response to Diagnosis, Current Treatment, and Prognosis:  Patient has a good understanding of the reason for admission. Patient appears happy with hospital care.  Emotional Assessment Appearance:  Appears stated age Attitude/Demeanor/Rapport:  Engaged, Gracious Affect (typically observed):  Accepting, Appropriate, Calm, Pleasant Orientation:  Oriented to Self, Oriented to Place, Oriented to  Time, Oriented to Situation Alcohol / Substance use:  Tobacco Use Psych involvement (Current and /or in the community):  No (Comment)  Discharge Needs  Concerns to be addressed:  Care Coordination Readmission within the last 30 days:  No Current discharge risk:  Lives alone Barriers to Discharge:  Continued Medical Work up, [place], LCSW 05/25/2018, 1:54 PM

## 2018-05-25 NOTE — Clinical Social Work Placement (Signed)
   CLINICAL SOCIAL WORK PLACEMENT  NOTE  Date:  05/25/2018  Patient Details  Name: Earl Martin MRN: [id number] Date of Birth: 05/24/1961  Clinical Social Work is seeking post-discharge placement for this patient at the Skilled  Nursing Facility level of care (*CSW will initial, date and re-position this form in  chart as items are completed):      Patient/family provided with [facility] Clinical [facility] Work Department's list of facilities offering this level of care within the geographic area requested by the patient (or if unable, by the patient's family).      Patient/family informed of their freedom to choose among providers that offer the needed level of care, that participate in Medicare, Medicaid or managed care program needed by the patient, have an available bed and are willing to accept the patient.      Patient/family informed of [facility]'s ownership interest in [facility] and [facility], as well as of the fact that they are under no obligation to receive care at these facilities.  PASRR submitted to EDS on 05/25/18     PASRR number received on 05/25/18     Existing PASRR number confirmed on       FL2 transmitted to all facilities in geographic area requested by pt/family on 05/25/18     FL2 transmitted to all facilities within larger geographic area on       Patient informed that his/her managed care company has contracts with or will negotiate with certain facilities, including the following:            Patient/family informed of bed offers received.  Patient chooses bed at       Physician recommends and patient chooses bed at      Patient to be transferred to   on  .  Patient to be transferred to facility by       Patient family notified on   of transfer.  Name of family member notified:        PHYSICIAN Please sign FL2     Additional Comment:    _______________________________________________ Margarito LinerSarah C Loretta Kluender, LCSW 05/25/2018, 1:58  PM

## 2018-05-26 ENCOUNTER — Inpatient Hospital Stay (HOSPITAL_COMMUNITY): Payer: BLUE CROSS/BLUE SHIELD

## 2018-05-26 LAB — BASIC METABOLIC PANEL
ANION GAP: 9 (ref 5–15)
BUN: 8 mg/dL (ref 6–20)
CO2: 28 mmol/L (ref 22–32)
Calcium: 8.8 mg/dL — ABNORMAL LOW (ref 8.9–10.3)
Chloride: 94 mmol/L — ABNORMAL LOW (ref 98–111)
Creatinine, Ser: 0.74 mg/dL (ref 0.61–1.24)
GFR calc Af Amer: >60 mL/min (ref >60)
GFR calc non Af Amer: >60 mL/min (ref >60)
Glucose, Bld: 114 mg/dL — ABNORMAL HIGH (ref 70–99)
Potassium: 4.5 mmol/L (ref 3.5–5.1)
Sodium: 131 mmol/L — ABNORMAL LOW (ref 135–145)

## 2018-05-26 LAB — PROTIME-INR
INR: 1.38
Prothrombin Time: 16.8 seconds — ABNORMAL HIGH (ref 11.4–15.2)

## 2018-05-26 MED ORDER — FUROSEMIDE 40 MG PO TABS
40.0000 mg | ORAL_TABLET | Freq: Every day | ORAL | Status: DC
  Administered 2018-05-26 – 2018-05-28 (×3): 40 mg via ORAL
  Filled 2018-05-26 (×3): qty 1

## 2018-05-26 MED ORDER — PRAVASTATIN SODIUM 40 MG PO TABS
40.0000 mg | ORAL_TABLET | Freq: Every day | ORAL | Status: DC
  Administered 2018-05-26 – 2018-05-27 (×2): 40 mg via ORAL
  Filled 2018-05-26 (×2): qty 1

## 2018-05-26 MED ORDER — POTASSIUM CHLORIDE CRYS ER 20 MEQ PO TBCR
20.0000 meq | EXTENDED_RELEASE_TABLET | Freq: Every day | ORAL | Status: DC
  Administered 2018-05-26 – 2018-05-28 (×3): 20 meq via ORAL
  Filled 2018-05-26 (×3): qty 1

## 2018-05-26 NOTE — Progress Notes (Signed)
[address]       [place] [id number]             [phone]      8 Days Post-Op Procedure(s) (LRB): MITRAL VALVE REPAIR (MVR) (N/A) CORONARY ARTERY BYPASS GRAFTING (CABG), ON PUMP, TIMES ONE, USING ENDOSCOPICALLY HARVESTED RIGHT GREATER SAPHENOUS VEIN (N/A) MAZE (N/A) TRANSESOPHAGEAL ECHOCARDIOGRAM (TEE) (N/A) Subjective: Feels well, some soreness  Objective: Vital signs in last 24 hours: Temp:  [97.6 F (36.4 C)-98.3 F (36.8 C)] 97.9 F (36.6 C) (12/12 0732) Pulse Rate:  [73-83] 83 (12/12 0732) Cardiac Rhythm: Atrial fibrillation (12/12 0718) Resp:  [13-23] 20 (12/12 0732) BP: (107-138)/(65-82) 138/75 (12/12 0732) SpO2:  [90 %-100 %] 100 % (12/12 0732) Weight:  [84.8 kg] 84.8 kg (12/12 0434)  Hemodynamic parameters for last 24 hours:    Intake/Output from previous day: 12/11 0701 - 12/12 0700 In: 820 [P.O.:820] Out: 5350 [Urine:5350] Intake/Output this shift: No intake/output data recorded.  General appearance: alert, cooperative and no distress Heart: irregularly irregular rhythm and no murmmur Lungs: clear to auscultation bilaterally Abdomen: benign Extremities: min edema, much improved Wound: incis healing well  Lab Results: Recent Labs    05/24/18 0324  WBC 6.4  HGB 10.7*  HCT 32.7*  PLT 167   BMET:  Recent Labs    05/24/18 0324 05/26/18 0300  NA 131* 131*  K 4.2 4.5  CL 93* 94*  CO2 27 28  GLUCOSE 106* 114*  BUN 10 8  CREATININE 0.54* 0.74  CALCIUM 8.6* 8.8*    PT/INR:  Recent Labs    05/26/18 0300  LABPROT 16.8*  INR 1.38   ABG    Component Value Date/Time   PHART 7.342 (L) 05/18/2018 2231   HCO3 18.1 (L) 05/18/2018 2231   TCO2 20 (L) 05/19/2018 1735   ACIDBASEDEF 7.0 (H) 05/18/2018 2231   O2SAT 94.0 05/18/2018 2231   CBG (last 3)  No results for input(s): GLUCAP in the last 72 hours.  Meds Scheduled Meds: . aspirin EC  81 mg Oral Daily  . bisacodyl  10 mg Oral Daily   Or  . bisacodyl  10 mg  Rectal Daily  . docusate sodium  200 mg Oral Daily  . enoxaparin (LOVENOX) injection  40 mg Subcutaneous QHS  . furosemide  40 mg Intravenous BID  . lisinopril  20 mg Oral Daily  . pantoprazole  40 mg Oral Daily  . potassium chloride  40 mEq Oral BID WC  . sodium chloride flush  3 mL Intravenous Q12H  . warfarin  10 mg Oral q1800  . warfarin   Does not apply Once  . Warfarin - Physician Dosing Inpatient   Does not apply q1800   Continuous Infusions: . sodium chloride    . lactated ringers Stopped (05/20/18 0526)   PRN Meds:.sodium chloride, metoprolol tartrate, morphine injection, ondansetron (ZOFRAN) IV, oxyCODONE, sodium chloride flush, sorbitol, traMADol  Xrays Dg Chest 2 View  Result Date: 05/26/2018 CLINICAL DATA:  Status post coronary bypass grafting EXAM: CHEST - 2 VIEW COMPARISON:  05/25/2018 FINDINGS: Cardiac shadow remains enlarged. Postsurgical changes are again seen. Considerable subcutaneous emphysema is noted. No recurrent pneumothorax is seen at this time. Mild left basilar atelectasis is noted. IMPRESSION: Mild left basilar atelectasis. No recurrent pneumothorax is noted. Electronically Signed   By: Alcide CleverMark  Lukens M.D.   On: 05/26/2018 08:49   Dg Chest Port 1 View  Result Date: 05/25/2018 CLINICAL DATA:  Status post chest tube  EXAM: PORTABLE CHEST 1 VIEW COMPARISON:  05/25/2018 FINDINGS: Cardiac shadow is enlarged. Postsurgical changes are again seen and stable. Right-sided chest tube has been removed in the interval. Persistent subcutaneous emphysema is noted. No definitive pneumothorax is identified. No focal infiltrate is seen. IMPRESSION: No evidence of pneumothorax following chest tube removal. Electronically Signed   By: Alcide Clever M.D.   On: 05/25/2018 11:59   Dg Chest Port 1 View  Result Date: 05/25/2018 CLINICAL DATA:  Post open heart surgery.  Chest tube. EXAM: PORTABLE CHEST 1 VIEW COMPARISON:  05/24/2018 FINDINGS: Extensive subcutaneous emphysema throughout  the chest wall and neck, unchanged. Right chest tube remains in place. No definite visible pneumothorax. Cardiomegaly with vascular congestion and bibasilar atelectasis. IMPRESSION: Right chest tube remains in place without visible pneumothorax currently. Stable extensive subcutaneous emphysema. Cardiomegaly, vascular congestion and bibasilar atelectasis. Electronically Signed   By: Charlett Nose M.D.   On: 05/25/2018 09:39    Assessment/Plan: S/P Procedure(s) (LRB): MITRAL VALVE REPAIR (MVR) (N/A) CORONARY ARTERY BYPASS GRAFTING (CABG), ON PUMP, TIMES ONE, USING ENDOSCOPICALLY HARVESTED RIGHT GREATER SAPHENOUS VEIN (N/A) MAZE (N/A) TRANSESOPHAGEAL ECHOCARDIOGRAM (TEE) (N/A)   1 conts to improve 2 INR rising- cont current coumadin dose for now 3 renal fxn is normal, volume status much better, change to 40 mg po lasix 4 BS controlled 5 CXR stable without pntx 6 afib with controlled rate 7 BP control is good on current meds 8 sats good on RA 9 SW assisting with short term placement- should be able to d/c soon   LOS: 8 days    Rowe Clack PA-C 05/26/2018 Pager [phone]

## 2018-05-26 NOTE — Progress Notes (Signed)
[id number] Pt has been walking short distances independently. Education completed with pt re walking for ex. Encouraged pt to try to walk a little farther every day to build up endurance. Encouraged IS, staying in the tube, gave heart healthy diet,CRP 2. Referring to New [facility] CRP 2. Wrote down how to view discharge and Coumadin videos. Luetta NuttingCharlene Azaya Goedde RN BSN 05/26/2018 11:53 AM

## 2018-05-26 NOTE — Progress Notes (Addendum)
CSW talked with the patient at bedside. He was alert and oriented. CSW discussed SNF placement process and the possibility SNF placement may be denied by his insurance. CSW provided the patient with SNF listing form [email or web address]-[email or web address]/[email or web address], which included the nursing home compare star ratings(copy placed in chart). Patient states he understands he needs to consider other options if he is denied by his insurance for SNF placement.   Patient will select a SNF and inform the CSW of his choice.   Antony Blackbirdynthia Deondray Ospina, [facility]irmaryCSWA Clinical Social Worker [phone]

## 2018-05-26 NOTE — Progress Notes (Signed)
Pt walked in hall 240 ft. Independent after setup. On room air. No Assistive device. No complaint from pt. Tolerated well. Back in bed doing IS. Will continue to monitor.

## 2018-05-27 LAB — PROTIME-INR
INR: 1.72
Prothrombin Time: 19.9 seconds — ABNORMAL HIGH (ref 11.4–15.2)

## 2018-05-27 NOTE — Progress Notes (Signed)
CSW spoke with the patient. He was alert and oriented. Patient expressed appreciation for CSW assistance. Patient states he has considered other options outside of SNF placement and currently believes he has things worked out and he no longer needs SNF placement.   Clinical Social Worker will sign off for now as social work intervention is no longer needed. Please consult us again if new needs arises.    Antony Blackbirdynthia Kateryn Marasigan, [facility] CampusCSWA Clinical Social Worker [phone]

## 2018-05-27 NOTE — Progress Notes (Signed)
Physical Therapy Treatment Patient Details Name: Earl Martin MRN: [id number] DOB: 18-Feb-1961 Today's Date: 05/27/2018    History of Present Illness 57 yo admitted for CABG/MVR. PMhx: CAD, MI, HTN, AFib, OSA, AAA repair    PT Comments    Patient received ambulating independently with no assistive device in hallway. HR stable in 80's. Session focused on reviewing therapeutic exercises for postural re-education and strengthening. Pt able to perform with good carryover. Written handout provided. Updated discharge plan to outpatient cardiac rehab.     Follow Up Recommendations  Other (comment) OP Cardiac rehab     Equipment Recommendations  None recommended by PT    Recommendations for Other Services       Precautions / Restrictions Precautions Precautions: Sternal Precaution Booklet Issued: Yes (comment) Restrictions Weight Bearing Restrictions: Yes(sternal precautions)    Mobility  Bed Mobility               General bed mobility comments: OOB walking in hallway  Transfers Overall transfer level: Independent Equipment used: None                Ambulation/Gait Ambulation/Gait assistance: Independent Gait Distance (Feet): 400 Feet Assistive device: None Gait Pattern/deviations: Step-through pattern;Trunk flexed     General Gait Details: Patient received ambulating in hallways independently. Cues for upright posture and pt able to correct somewhat.    Stairs             Wheelchair Mobility    Modified Rankin (Stroke Patients Only)       Balance Overall balance assessment: No apparent balance deficits (not formally assessed)                                          Cognition Arousal/Alertness: Awake/alert Behavior During Therapy: WFL for tasks assessed/performed Overall Cognitive Status: Within Functional Limits for tasks assessed                                        Exercises Other  Exercises Other Exercises: Standing: AROM cervical rotation, bilateral shoulder shrugs, unilateral shoulder shrugs, hip abduction with hand support x 5 each    General Comments        Pertinent Vitals/Pain Pain Assessment: Faces Faces Pain Scale: No hurt    Home Living                      Prior Function            PT Goals (current goals can now be found in the care plan section) Acute Rehab PT Goals Patient Stated Goal[place] beach  Potential to Achieve Goals: Good Progress towards PT goals: Progressing toward goals    Frequency    Min 2X/week      PT Plan Discharge plan needs to be updated    Co-evaluation              AM-PAC PT "6 Clicks" Mobility   Outcome Measure  Help needed turning from your back to your side while in a flat bed without using bedrails?: None Help needed moving from lying on your back to sitting on the side of a flat bed without using bedrails?: None Help needed moving to and from a bed to a chair (including  a wheelchair)?: None Help needed standing up from a chair using your arms (e.g., wheelchair or bedside chair)?: None Help needed to walk in hospital room?: None Help needed climbing 3-5 steps with a railing? : None 6 Click Score: 24    End of Session   Activity Tolerance: Patient tolerated treatment well Patient left: Other (comment)(standing in room)   PT Visit Diagnosis: Other abnormalities of gait and mobility (R26.89)     Time: [id number] PT Time Calculation (min) (ACUTE ONLY): 17 min  Charges:  $Therapeutic Exercise: 8-22 mins                     Earl Martin, PT, DPT Acute Rehabilitation Services Pager [phone] Office [phone]   Earl Martin 05/27/2018, 4:35 PM

## 2018-05-27 NOTE — Progress Notes (Addendum)
[address]       [place] [id number]             [phone]      9 Days Post-Op Procedure(s) (LRB): MITRAL VALVE REPAIR (MVR) (N/A) CORONARY ARTERY BYPASS GRAFTING (CABG), ON PUMP, TIMES ONE, USING ENDOSCOPICALLY HARVESTED RIGHT GREATER SAPHENOUS VEIN (N/A) MAZE (N/A) TRANSESOPHAGEAL ECHOCARDIOGRAM (TEE) (N/A) Subjective: conts to feel well  Objective: Vital signs in last 24 hours: Temp:  [97.8 F (36.6 C)-98 F (36.7 C)] 98 F (36.7 C) (12/13 0807) Pulse Rate:  [68-116] 77 (12/13 0807) Cardiac Rhythm: Atrial fibrillation (12/13 0500) Resp:  [12-20] 20 (12/13 0807) BP: (98-131)/(62-86) 131/65 (12/13 0807) SpO2:  [97 %-100 %] 100 % (12/13 0807) Weight:  [83.9 kg] 83.9 kg (12/13 0600)  Hemodynamic parameters for last 24 hours:    Intake/Output from previous day: 12/12 0701 - 12/13 0700 In: 1220 [P.O.:1220] Out: -  Intake/Output this shift: Total I/O In: 240 [P.O.:240] Out: -   General appearance: alert, cooperative and no distress Heart: irregularly irregular rhythm Lungs: clear to auscultation bilaterally Abdomen: benign exam Extremities: min edema Wound: incis healing well  Lab Results: No results for input(s): WBC, HGB, HCT, PLT in the last 72 hours. BMET:  Recent Labs    05/26/18 0300  NA 131*  K 4.5  CL 94*  CO2 28  GLUCOSE 114*  BUN 8  CREATININE 0.74  CALCIUM 8.8*    PT/INR:  Recent Labs    05/27/18 0734  LABPROT 19.9*  INR 1.72   ABG    Component Value Date/Time   PHART 7.342 (L) 05/18/2018 2231   HCO3 18.1 (L) 05/18/2018 2231   TCO2 20 (L) 05/19/2018 1735   ACIDBASEDEF 7.0 (H) 05/18/2018 2231   O2SAT 94.0 05/18/2018 2231   CBG (last 3)  No results for input(s): GLUCAP in the last 72 hours.  Meds Scheduled Meds: . aspirin EC  81 mg Oral Daily  . bisacodyl  10 mg Oral Daily   Or  . bisacodyl  10 mg Rectal Daily  . docusate sodium  200 mg Oral Daily  . enoxaparin (LOVENOX) injection  40 mg  Subcutaneous QHS  . furosemide  40 mg Oral Daily  . lisinopril  20 mg Oral Daily  . pantoprazole  40 mg Oral Daily  . potassium chloride  20 mEq Oral Daily  . pravastatin  40 mg Oral q1800  . sodium chloride flush  3 mL Intravenous Q12H  . warfarin  10 mg Oral q1800  . warfarin   Does not apply Once  . Warfarin - Physician Dosing Inpatient   Does not apply q1800   Continuous Infusions: . sodium chloride    . lactated ringers Stopped (05/20/18 0526)   PRN Meds:.sodium chloride, metoprolol tartrate, morphine injection, ondansetron (ZOFRAN) IV, oxyCODONE, sodium chloride flush, sorbitol, traMADol  Xrays Dg Chest 2 View  Result Date: 05/26/2018 CLINICAL DATA:  Status post coronary bypass grafting EXAM: CHEST - 2 VIEW COMPARISON:  05/25/2018 FINDINGS: Cardiac shadow remains enlarged. Postsurgical changes are again seen. Considerable subcutaneous emphysema is noted. No recurrent pneumothorax is seen at this time. Mild left basilar atelectasis is noted. IMPRESSION: Mild left basilar atelectasis. No recurrent pneumothorax is noted. Electronically Signed   By: Alcide CleverMark  Lukens M.D.   On: 05/26/2018 08:49   Dg Chest Port 1 View  Result Date: 05/25/2018 CLINICAL DATA:  Status post chest tube EXAM: PORTABLE CHEST 1 VIEW COMPARISON:  05/25/2018 FINDINGS: Cardiac shadow  is enlarged. Postsurgical changes are again seen and stable. Right-sided chest tube has been removed in the interval. Persistent subcutaneous emphysema is noted. No definitive pneumothorax is identified. No focal infiltrate is seen. IMPRESSION: No evidence of pneumothorax following chest tube removal. Electronically Signed   By: Alcide Clever M.D.   On: 05/25/2018 11:59    Assessment/Plan: S/P Procedure(s) (LRB): MITRAL VALVE REPAIR (MVR) (N/A) CORONARY ARTERY BYPASS GRAFTING (CABG), ON PUMP, TIMES ONE, USING ENDOSCOPICALLY HARVESTED RIGHT GREATER SAPHENOUS VEIN (N/A) MAZE (N/A) TRANSESOPHAGEAL ECHOCARDIOGRAM (TEE) (N/A)   1 conts  with good progress 2 hemodyn stable in rate controlled afib 3 sats good on RA 4 INR rising on current Dose of coumadin 5 no other new labs 6 placement- SW assisting  LOS: 9 days    Rowe Clack [facility] 05/27/2018 Pager [phone]   I have seen and examined the patient and agree with the assessment and plan as outlined.  Looks ready for d/c once satisfactory arrangements in place.  Purcell Nails, MD 05/27/2018 2:22 PM

## 2018-05-28 LAB — PROTIME-INR
INR: 2.04
Prothrombin Time: 22.8 seconds — ABNORMAL HIGH (ref 11.4–15.2)

## 2018-05-28 MED ORDER — WARFARIN SODIUM 5 MG PO TABS: 7.5000 mg | ORAL_TABLET | Freq: Every day | ORAL | 1 refills | Status: DC

## 2018-05-28 MED ORDER — POTASSIUM CHLORIDE CRYS ER 20 MEQ PO TBCR: 20.0000 meq | EXTENDED_RELEASE_TABLET | Freq: Every day | ORAL | 0 refills | Status: DC

## 2018-05-28 MED ORDER — OXYCODONE HCL 5 MG PO TABS: 5.0000 mg | ORAL_TABLET | Freq: Four times a day (QID) | ORAL | 0 refills | Status: AC | PRN

## 2018-05-28 MED ORDER — LISINOPRIL 20 MG PO TABS: 20.0000 mg | ORAL_TABLET | Freq: Every day | ORAL | 1 refills | Status: DC

## 2018-05-28 MED ORDER — FUROSEMIDE 40 MG PO TABS: 40.0000 mg | ORAL_TABLET | Freq: Every day | ORAL | 0 refills | Status: DC

## 2018-05-28 MED ORDER — ASPIRIN 81 MG PO TBEC: 81.0000 mg | DELAYED_RELEASE_TABLET | Freq: Every day | ORAL | Status: DC

## 2018-05-28 NOTE — Progress Notes (Signed)
Removed CT sutures and applied benzoin and 1/2 " steri strips.  Pt tolerated procedure well. Pt given signs and symptoms of infection. Adonte Vanriper McClintock, RN    

## 2018-05-28 NOTE — Progress Notes (Signed)
10 Days Post-Op Procedure(s) (LRB): MITRAL VALVE REPAIR (MVR) (N/A) CORONARY ARTERY BYPASS GRAFTING (CABG), ON PUMP, TIMES ONE, USING ENDOSCOPICALLY HARVESTED RIGHT GREATER SAPHENOUS VEIN (N/A) MAZE (N/A) TRANSESOPHAGEAL ECHOCARDIOGRAM (TEE) (N/A) Subjective: Feels well, now has some family he can stay with so won't need SNF now  Objective: Vital signs in last 24 hours: Temp:  [97.6 F (36.4 C)-98 F (36.7 C)] 98 F (36.7 C) (12/14 0846) Pulse Rate:  [71-85] 74 (12/14 0846) Cardiac Rhythm: Atrial fibrillation (12/14 0921) Resp:  [14-22] 14 (12/14 0846) BP: (100-132)/(68-77) 118/77 (12/14 0846) SpO2:  [98 %-100 %] 98 % (12/14 0846) Weight:  [84.3 kg] 84.3 kg (12/14 0302)  Hemodynamic parameters for last 24 hours:    Intake/Output from previous day: 12/13 0701 - 12/14 0700 In: 1083 [P.O.:1080; I.V.:3] Out: -  Intake/Output this shift: No intake/output data recorded.  General appearance: alert, cooperative and no distress Heart: irregularly irregular rhythm Lungs: clear to auscultation bilaterally Abdomen: benign Extremities: min edema Wound: incis healing well  Lab Results: No results for input(s): WBC, HGB, HCT, PLT in the last 72 hours. BMET:  Recent Labs    05/26/18 0300  NA 131*  K 4.5  CL 94*  CO2 28  GLUCOSE 114*  BUN 8  CREATININE 0.74  CALCIUM 8.8*    PT/INR:  Recent Labs    05/28/18 0324  LABPROT 22.8*  INR 2.04   ABG    Component Value Date/Time   PHART 7.342 (L) 05/18/2018 2231   HCO3 18.1 (L) 05/18/2018 2231   TCO2 20 (L) 05/19/2018 1735   ACIDBASEDEF 7.0 (H) 05/18/2018 2231   O2SAT 94.0 05/18/2018 2231   CBG (last 3)  No results for input(s): GLUCAP in the last 72 hours.  Meds Scheduled Meds: . aspirin EC  81 mg Oral Daily  . bisacodyl  10 mg Oral Daily   Or  . bisacodyl  10 mg Rectal Daily  . docusate sodium  200 mg Oral Daily  . enoxaparin (LOVENOX) injection  40 mg Subcutaneous QHS  . furosemide  40 mg Oral Daily  .  lisinopril  20 mg Oral Daily  . pantoprazole  40 mg Oral Daily  . potassium chloride  20 mEq Oral Daily  . pravastatin  40 mg Oral q1800  . sodium chloride flush  3 mL Intravenous Q12H  . warfarin  10 mg Oral q1800  . warfarin   Does not apply Once  . Warfarin - Physician Dosing Inpatient   Does not apply q1800   Continuous Infusions: . sodium chloride    . lactated ringers Stopped (05/20/18 0526)   PRN Meds:.sodium chloride, metoprolol tartrate, morphine injection, ondansetron (ZOFRAN) IV, oxyCODONE, sodium chloride flush, sorbitol, traMADol  Xrays No results found.  Assessment/Plan: S/P Procedure(s) (LRB): MITRAL VALVE REPAIR (MVR) (N/A) CORONARY ARTERY BYPASS GRAFTING (CABG), ON PUMP, TIMES ONE, USING ENDOSCOPICALLY HARVESTED RIGHT GREATER SAPHENOUS VEIN (N/A) MAZE (N/A) TRANSESOPHAGEAL ECHOCARDIOGRAM (TEE) (N/A) Plan for discharge: see discharge orders coumadin 7.5 mg at discharge   LOS: 10 days    Rowe ClackWayne E Caitriona Sundquist [facility]A-C 05/28/2018 Pager [phone]

## 2018-06-20 ENCOUNTER — Ambulatory Visit: Payer: BLUE CROSS/BLUE SHIELD | Admitting: Thoracic Surgery (Cardiothoracic Vascular Surgery)

## 2018-06-29 ENCOUNTER — Other Ambulatory Visit: Payer: Self-pay | Admitting: Thoracic Surgery (Cardiothoracic Vascular Surgery)

## 2018-06-29 DIAGNOSIS — Z951 Presence of aortocoronary bypass graft: Secondary | ICD-10-CM

## 2018-06-29 NOTE — Progress Notes (Signed)
cxr 

## 2018-06-30 ENCOUNTER — Ambulatory Visit (INDEPENDENT_AMBULATORY_CARE_PROVIDER_SITE_OTHER): Payer: Self-pay | Admitting: Thoracic Surgery (Cardiothoracic Vascular Surgery)

## 2018-06-30 ENCOUNTER — Ambulatory Visit
Admission: RE | Admit: 2018-06-30 | Discharge: 2018-06-30 | Disposition: A | Discharge status: Home / Self Care | Payer: BLUE CROSS/BLUE SHIELD | Source: Ambulatory Visit | Attending: Thoracic Surgery (Cardiothoracic Vascular Surgery) | Admitting: Thoracic Surgery (Cardiothoracic Vascular Surgery)

## 2018-06-30 ENCOUNTER — Encounter: Payer: Self-pay | Admitting: Thoracic Surgery (Cardiothoracic Vascular Surgery)

## 2018-06-30 VITALS — BP 140/76 | HR 56 | Resp 20 | Ht 72.0 in | Wt 189.0 lb

## 2018-06-30 DIAGNOSIS — Z951 Presence of aortocoronary bypass graft: Secondary | ICD-10-CM

## 2018-06-30 DIAGNOSIS — Z8679 Personal history of other diseases of the circulatory system: Secondary | ICD-10-CM

## 2018-06-30 DIAGNOSIS — Z9889 Other specified postprocedural states: Secondary | ICD-10-CM

## 2018-06-30 NOTE — Progress Notes (Signed)
[address]       [place] [address]             [phone]     CARDIOTHORACIC SURGERY OFFICE NOTE  Referring Provider is Yates Decamp, MD PCP is Ralene Ok, MD   HPI:  Patient is a 58 year old male with history of coronary artery disease status post inferolateral wall myocardial infarction in 2012 status post multivessel PCI and stenting at that time, mitral regurgitation, hypertension, long-standing persistent atrial fibrillation, obstructive sleep apnea, abdominal aortic aneurysm status post endovascular repair in 2016, and long-standing tobacco abuse who returns to the office today for routine follow-up status post mitral valve repair, coronary artery bypass grafting x1, and Maze procedure on May 18, 2018.  The patient's early postoperative recovery was notable for a postoperative pneumothorax on the right side requiring chest tube placement.  He went back into rate controlled atrial fibrillation postoperatively.  He was eventually discharged home on the 10th postoperative day on warfarin anticoagulation.  He stayed locally in [place] for approximately a week but since then return to [facility] where he lives.  His 2 sons have been staying with him.  He reportedly has made excellent progress.  He was seen in follow-up recently by Dr. Jacinto Halim and he has been scheduled for routine follow-up echocardiogram.  His prothrombin time and Coumadin dosing has been managed close to his home.  Reportedly his INR was elevated greater than 8 recently.  He was instructed to stop taking Coumadin for several days and resumed at a lower dose.  He has been followed on a weekly basis for rechecking his INR.  He reports that physically he feels well.  He is walking 4 times a day for up to 20 minutes at a time.  He reports no exertional shortness of breath whatsoever.  He has mild residual soreness in his chest but he has not required any sort of pain relievers.  He has not had  any palpitations or dizzy spells.  Overall he is delighted with his progress.   Current Outpatient Medications  Medication Sig Dispense Refill  . Ascorbic Acid (VITAMIN C) 1000 MG tablet Take 1,000 mg by mouth daily.    Marland Kitchen aspirin EC 81 MG EC tablet Take 1 tablet (81 mg total) by mouth daily.    . furosemide (LASIX) 40 MG tablet Take 1 tablet (40 mg total) by mouth daily. 14 tablet 0  . lisinopril (PRINIVIL,ZESTRIL) 20 MG tablet Take 1 tablet (20 mg total) by mouth daily. 30 tablet 1  . lovastatin (MEVACOR) 40 MG tablet Take 40 mg by mouth at bedtime.    . Multiple Vitamin (MULTIVITAMIN) capsule Take 1 capsule by mouth daily.    Marland Kitchen omega-3 acid ethyl esters (LOVAZA) 1 g capsule Take 1 g by mouth daily.    . potassium chloride SA (K-DUR,KLOR-CON) 20 MEQ tablet Take 1 tablet (20 mEq total) by mouth daily. 14 tablet 0  . warfarin (COUMADIN) 5 MG tablet Take 1.5 tablets (7.5 mg total) by mouth daily at 6 PM. As directed by your cardiologist 100 tablet 1   No current facility-administered medications for this visit.       Physical Exam:   BP 140/76   Pulse (!) 56   Resp 20   Ht 6' (1.829 m)   Wt 189 lb (85.7 kg)   SpO2 97% Comment: RA  BMI 25.63 kg/m   General:  Well-appearing  Chest:   Clear to auscultation  CV:   Regular rate and rhythm with soft systolic murmur heard best at the apex  Incisions:  Healing nicely, sternum is stable  Abdomen:  Soft nontender  Extremities:  Warm and well-perfused, no edema  Diagnostic Tests:  2 channel telemetry rhythm strip demonstrates what appears to be atrial fibrillation.  The rhythm is narrow complex.  Heart rate is fairly consistent but not perfectly regular.    CHEST - 2 VIEW  COMPARISON:  05/26/2018  FINDINGS: Postsurgical changes are again seen and stable. Cardiac shadow is mildly enlarged but stable. The lungs are clear bilaterally. No pneumothorax is seen. No acute bony abnormality is noted.  IMPRESSION: No acute abnormality  seen.   Electronically Signed   By: Alcide Clever M.D.   On: 06/30/2018 10:00   Impression:  Patient is doing well approximately 6 weeks status post mitral valve repair, coronary artery bypass grafting, and Maze procedure.  He appears to be in rate controlled atrial fibrillation.  He is anticoagulated using warfarin, which is being managed close to home.  He does have a soft systolic murmur on physical exam.  Routine follow-up echocardiogram has been scheduled by Dr. Jacinto Halim.  Plan:  We have not recommended any change the patient's current medications.  I have encouraged the patient to continue to gradually increase his physical activity but to refrain from any significant heavy lifting or strenuous use of his arms or shoulders for at least another 2 months.  We will make sure that he has been referred to the outpatient cardiac rehab program at [facility] in [place].  The patient has been reminded regarding the importance of dental hygiene and the lifelong need for antibiotic prophylaxis for all dental cleanings and other related invasive procedures.  The patient will return to see [place] in approximately 2 months.  We will review the patient's follow-up echocardiogram performed at Dr. Verl Dicker office as soon as practical.  All of his questions have been addressed.    Salvatore Decent. Cornelius Moras, MD 06/30/2018 10:48 AM

## 2018-06-30 NOTE — Patient Instructions (Signed)
Continue all previous medications without any changes at this time  Continue to avoid any heavy lifting or strenuous use of your arms or shoulders for at least a total of three months from the time of surgery.  After three months you may gradually increase how much you lift or otherwise use your arms or chest as tolerated, with limits based upon whether or not activities lead to the return of significant discomfort.  You may return to driving an automobile as long as you are no longer requiring oral narcotic pain relievers during the daytime.  It would be wise to start driving only short distances during the daylight and gradually increase from there as you feel comfortable.  You are encouraged to enroll and participate in the outpatient cardiac rehab program beginning as soon as practical.  Endocarditis is a potentially serious infection of heart valves or inside lining of the heart.  It occurs more commonly in patients with diseased heart valves (such as patient's with aortic or mitral valve disease) and in patients who have undergone heart valve repair or replacement.  Certain surgical and dental procedures may put you at risk, such as dental cleaning, other dental procedures, or any surgery involving the respiratory, urinary, gastrointestinal tract, gallbladder or prostate gland.   To minimize your chances for develooping endocarditis, maintain good oral health and seek prompt medical attention for any infections involving the mouth, teeth, gums, skin or urinary tract.    Always notify your doctor or dentist about your underlying heart valve condition before having any invasive procedures. You will need to take antibiotics before certain procedures, including all routine dental cleanings or other dental procedures.  Your cardiologist or dentist should prescribe these antibiotics for you to be taken ahead of time.      

## 2018-07-01 ENCOUNTER — Telehealth: Payer: Self-pay

## 2018-07-01 NOTE — Telephone Encounter (Signed)
Attempted to contacted [facility] in regards to a new patient referral have left a massage for a call back.

## 2018-07-05 ENCOUNTER — Other Ambulatory Visit: Payer: Self-pay

## 2018-07-18 ENCOUNTER — Other Ambulatory Visit: Payer: Self-pay | Admitting: Cardiology

## 2018-07-19 ENCOUNTER — Telehealth: Payer: Self-pay

## 2018-07-19 DIAGNOSIS — I4819 Other persistent atrial fibrillation: Secondary | ICD-10-CM

## 2018-07-19 LAB — PROTIME-INR
INR: 5.2 (ref 0.8–1.2)
Prothrombin Time: 46.4 s — ABNORMAL HIGH (ref 9.1–12.0)

## 2018-07-19 LAB — APTT: aPTT: 46 s — ABNORMAL HIGH (ref 24–33)

## 2018-07-19 NOTE — Telephone Encounter (Signed)
-----   Message from [facility], MD sent at 07/19/2018  5:43 AM EST ----- Regarding: Inr I got a call from [facility] about inr 5.2. Not sure why it was done there please follow up and bring the patient in today or tomorrow   Thanks MJP

## 2018-07-19 NOTE — Telephone Encounter (Signed)
Spoke with pt this morning and he lives far and cant just come in for an appt and he wants to see Earl Martin , How soon does he need to come in is his question?

## 2018-07-19 NOTE — Telephone Encounter (Signed)
Spoke with pt he is aware of the changes and will do them as told.

## 2018-07-19 NOTE — Telephone Encounter (Signed)
-----   Message from Manish J Patwardhan, MD sent at 07/19/2018  5:43 AM EST ----- Regarding: Inr I got a call from [facility] about inr 5.2. Not sure why it was done there please follow up and bring the patient in today or tomorrow   Thanks MJP 

## 2018-07-26 MED ORDER — RIVAROXABAN 20 MG PO TABS: 20.0000 mg | ORAL_TABLET | Freq: Every day | ORAL | Status: DC

## 2018-08-16 ENCOUNTER — Other Ambulatory Visit: Payer: Self-pay

## 2018-08-16 ENCOUNTER — Telehealth: Payer: Self-pay

## 2018-08-16 MED ORDER — NITROGLYCERIN 0.4 MG SL SUBL: 0.4000 mg | SUBLINGUAL_TABLET | SUBLINGUAL | 2 refills | Status: DC | PRN

## 2018-08-16 MED ORDER — LISINOPRIL 20 MG PO TABS: 20.0000 mg | ORAL_TABLET | Freq: Every day | ORAL | 1 refills | Status: DC

## 2018-08-16 NOTE — Telephone Encounter (Signed)
I would also prefer him to establish with someone there. I am very happy to take care of him and he is one of my favorite patients. However, I do agree that emergencies can occur in [facility] and [facility] and needs someone close by. He can always visit me if there is a need in future.  If he decides to change, I will be happy to see  him one more time before complete transfer.

## 2018-08-16 NOTE — Telephone Encounter (Signed)
Mr. Hnat stated he is currently doing Cardiac Rehab in [facility]. The facility is recommending that he have a local Cardiologist so that a provider in town has access to his records. He is not wanting to transfer his care. He is adament on keeping you as his Cardiologist. He would like your opinion one the matter. Please Advise.

## 2018-08-17 ENCOUNTER — Telehealth: Payer: Self-pay

## 2018-08-17 NOTE — Telephone Encounter (Signed)
I do not know anyone specifically there.  I think since his cardiac condition is stable, should be fine with anyone. Can always fall back on me.

## 2018-08-17 NOTE — Telephone Encounter (Signed)
Returned call

## 2018-08-17 NOTE — Telephone Encounter (Signed)
Returned call and lmom for [employer] and/or Dorathy Daft advising of [facility] and to return my call if needed. [phone]//MV

## 2018-08-17 NOTE — Telephone Encounter (Signed)
I called Earl Martin and informed him of your advice. He is wanting to know if you know or recommend anyone in the [place] area. Please advise.

## 2018-08-17 NOTE — Telephone Encounter (Signed)
He has A. Fib. If he is not symptomatic, I am fine with his heart rate.

## 2018-08-17 NOTE — Telephone Encounter (Signed)
msg made in error

## 2018-08-17 NOTE — Telephone Encounter (Signed)
Jenn-nurse from [facility] rehab called wanting to know some heart rate perimeters for exercise. Pt is in rehab now and HR has been getting up to 158,159. BP and all other is normal. Please advise.//ah

## 2018-08-18 NOTE — Telephone Encounter (Signed)
Patient aware.

## 2018-08-22 ENCOUNTER — Other Ambulatory Visit: Payer: Self-pay

## 2018-08-22 MED ORDER — LISINOPRIL 20 MG PO TABS: 20.0000 mg | ORAL_TABLET | Freq: Every day | ORAL | 1 refills | Status: DC

## 2018-08-23 ENCOUNTER — Other Ambulatory Visit: Payer: Self-pay

## 2018-08-29 ENCOUNTER — Ambulatory Visit: Payer: Self-pay | Admitting: Cardiology

## 2018-09-02 ENCOUNTER — Other Ambulatory Visit: Payer: Self-pay | Admitting: Cardiology

## 2018-09-05 ENCOUNTER — Ambulatory Visit: Payer: BLUE CROSS/BLUE SHIELD | Admitting: Surgery

## 2018-09-05 ENCOUNTER — Ambulatory Visit: Payer: BLUE CROSS/BLUE SHIELD | Admitting: Thoracic Surgery (Cardiothoracic Vascular Surgery)

## 2018-09-29 ENCOUNTER — Other Ambulatory Visit: Payer: Self-pay | Admitting: Cardiology

## 2018-09-30 NOTE — Telephone Encounter (Signed)
Please fill meds.

## 2018-10-03 ENCOUNTER — Ambulatory Visit: Payer: BLUE CROSS/BLUE SHIELD | Admitting: Thoracic Surgery (Cardiothoracic Vascular Surgery)

## 2018-10-03 ENCOUNTER — Other Ambulatory Visit: Payer: Self-pay | Admitting: Cardiology

## 2018-10-03 DIAGNOSIS — Z9889 Other specified postprocedural states: Principal | ICD-10-CM

## 2018-10-03 DIAGNOSIS — Z8679 Personal history of other diseases of the circulatory system: Secondary | ICD-10-CM

## 2018-10-10 ENCOUNTER — Ambulatory Visit: Payer: BLUE CROSS/BLUE SHIELD | Admitting: Surgery

## 2018-10-10 ENCOUNTER — Ambulatory Visit: Payer: BLUE CROSS/BLUE SHIELD | Admitting: Thoracic Surgery (Cardiothoracic Vascular Surgery)

## 2018-10-31 ENCOUNTER — Ambulatory Visit: Payer: Self-pay | Admitting: Cardiology

## 2018-11-02 ENCOUNTER — Other Ambulatory Visit: Payer: Self-pay | Admitting: Cardiology

## 2018-11-02 NOTE — Telephone Encounter (Signed)
Please fill

## 2018-11-04 ENCOUNTER — Other Ambulatory Visit: Payer: Self-pay | Admitting: Cardiology

## 2018-11-08 ENCOUNTER — Other Ambulatory Visit: Payer: Self-pay | Admitting: Cardiology

## 2018-11-08 ENCOUNTER — Telehealth: Payer: Self-pay | Admitting: Cardiology

## 2018-11-08 MED ORDER — LOVASTATIN 20 MG PO TABS: 20.0000 mg | ORAL_TABLET | Freq: Every day | ORAL | 6 refills | Status: DC

## 2018-11-08 NOTE — Telephone Encounter (Signed)
Patient states that he has been on Lovastatin 20 mg for some time. Will cancel 40 mg order in chart and send in 20 mg dose.

## 2018-11-09 ENCOUNTER — Other Ambulatory Visit: Payer: Self-pay

## 2018-12-05 ENCOUNTER — Ambulatory Visit: Payer: BLUE CROSS/BLUE SHIELD | Admitting: Thoracic Surgery (Cardiothoracic Vascular Surgery)

## 2018-12-05 ENCOUNTER — Encounter: Payer: Self-pay | Admitting: Cardiology

## 2018-12-05 ENCOUNTER — Encounter: Payer: Self-pay | Admitting: Thoracic Surgery (Cardiothoracic Vascular Surgery)

## 2018-12-05 ENCOUNTER — Other Ambulatory Visit: Payer: Self-pay

## 2018-12-05 ENCOUNTER — Ambulatory Visit (INDEPENDENT_AMBULATORY_CARE_PROVIDER_SITE_OTHER): Payer: BLUE CROSS/BLUE SHIELD | Admitting: Cardiology

## 2018-12-05 VITALS — BP 120/82 | HR 94 | Temp 97.7°F | Resp 20 | Ht 72.0 in | Wt 172.0 lb

## 2018-12-05 VITALS — BP 116/80 | HR 85 | Ht 72.0 in | Wt 173.0 lb

## 2018-12-05 DIAGNOSIS — I2510 Atherosclerotic heart disease of native coronary artery without angina pectoris: Secondary | ICD-10-CM

## 2018-12-05 DIAGNOSIS — Z298 Encounter for other specified prophylactic measures: Secondary | ICD-10-CM | POA: Diagnosis not present

## 2018-12-05 DIAGNOSIS — Z951 Presence of aortocoronary bypass graft: Secondary | ICD-10-CM | POA: Diagnosis not present

## 2018-12-05 DIAGNOSIS — I10 Essential (primary) hypertension: Secondary | ICD-10-CM

## 2018-12-05 DIAGNOSIS — Z8679 Personal history of other diseases of the circulatory system: Secondary | ICD-10-CM

## 2018-12-05 DIAGNOSIS — I4821 Permanent atrial fibrillation: Secondary | ICD-10-CM

## 2018-12-05 DIAGNOSIS — E7800 Pure hypercholesterolemia, unspecified: Secondary | ICD-10-CM

## 2018-12-05 DIAGNOSIS — Z9889 Other specified postprocedural states: Secondary | ICD-10-CM | POA: Diagnosis not present

## 2018-12-05 MED ORDER — AMOXICILLIN 500 MG PO TABS: 2000.0000 mg | ORAL_TABLET | Freq: Once | ORAL | 4 refills | Status: AC

## 2018-12-05 MED ORDER — LISINOPRIL 10 MG PO TABS: 10.0000 mg | ORAL_TABLET | Freq: Every day | ORAL | 3 refills | Status: DC

## 2018-12-05 MED ORDER — RIVAROXABAN 20 MG PO TABS: 20.0000 mg | ORAL_TABLET | Freq: Every day | ORAL | 3 refills | Status: DC

## 2018-12-05 MED ORDER — LOVASTATIN 20 MG PO TABS: 20.0000 mg | ORAL_TABLET | Freq: Every day | ORAL | 3 refills | Status: DC

## 2018-12-05 MED ORDER — NITROGLYCERIN 0.4 MG SL SUBL: 0.4000 mg | SUBLINGUAL_TABLET | SUBLINGUAL | 2 refills | Status: DC | PRN

## 2018-12-05 MED ORDER — METOPROLOL SUCCINATE ER 50 MG PO TB24: 50.0000 mg | ORAL_TABLET | Freq: Every day | ORAL | 3 refills | Status: DC

## 2018-12-05 NOTE — Progress Notes (Signed)
Primary Physician/Referring:  Jilda Panda, MD  Patient ID: Earl Martin, male    DOB: [birth year], 58 y.o.   MRN: [id number]  Chief Complaint  Patient presents with  . Coronary Artery Disease  . Hypertension  . Follow-up    HPI: Earl Martin  is a 58 y.o. male  with Earl Martin is a Caucasian male with severe mitral regurgitation, underwent CABG and MV Repair and Maze and LAA clipping  05/18/2018: SVG to RCA, Sorin Memo 4D annuloplasty 34 mm ring by Lilly Cove, MD.  He has coronary artery disease with inferior MI in 2012 S/P mid RCA stenting and staged stenting to LAD in July 2012, patent by angiography on 04/2018, abdominal aortic aneurysm status post repair on 11/29/2014 by placement of stent graft by Dr. Annamarie Major.  Hypertension, hyperlipidemia, chronic atrial fibrillation and chronic back pain due to spinal stenosis. He is tolerating anticoagulation without any bleeding complications. He is a cigarette smoker unfortunately.  1/2 PPD. He remains asymptomatic and exercises daily. He has not used S/L NTG.   Past Medical History:  Diagnosis Date  . Atrial fibrillation, persistent   . Complication of anesthesia    belligerant when awakes  . Coronary artery disease   . Dysrhythmia   . Headache   . Heart murmur   . Hypercholesterolemia   . Hypertension    essential  . Mitral regurgitation   . Myocardial infarction ([place]) 12/25/2010  . Pneumonia    hx  . S/P CABG x 1 05/18/2018   SVG to RPL, EVH via right thigh  . S/P Maze operation for atrial fibrillation 05/18/2018   Complete bilateral atrial lesion set using bipolar radiofrequency and cryothermy ablation with clipping of LA appendage  . S/P mitral valve repair 05/18/2018   34 mm Sorin Memo 4D ring annuloplasty  . Sleep apnea    does not use cpap  . Spinal stenosis     Past Surgical History:  Procedure Laterality Date  . ABDOMINAL AORTIC ENDOVASCULAR STENT GRAFT N/A 11/29/2014   Procedure: ABDOMINAL AORTIC  ENDOVASCULAR STENT GRAFT;  Surgeon: Serafina Mitchell, MD;  Location: [facility];  Service: Vascular;  Laterality: N/A;  . CARDIAC CATHETERIZATION    . CARDIOVERSION N/A 09/20/2012   Procedure: CARDIOVERSION;  Surgeon: Laverda Page, MD;  Location: [place];  Service: Cardiovascular;  Laterality: N/A;  . CORONARY ARTERY BYPASS GRAFT N/A 05/18/2018   Procedure: CORONARY ARTERY BYPASS GRAFTING (CABG), ON PUMP, TIMES ONE, USING ENDOSCOPICALLY HARVESTED RIGHT GREATER SAPHENOUS VEIN;  Surgeon: Rexene Alberts, MD;  Location: [place];  Service: Open Heart Surgery;  Laterality: N/A;  . CORONARY STENT PLACEMENT  12/25/2010   RCA  . CORONARY STENT PLACEMENT  12/26/2010   LAD  . HAND NERVE GRAFT    . INGUINAL HERNIA REPAIR Left 03/07/2013   Procedure: LEFT INGUINAL HERNIA REPAIR;  Surgeon: Rolm Bookbinder, MD;  Location: [place];  Service: General;  Laterality: Left;  . INSERTION OF MESH Left 03/07/2013   Procedure: INSERTION OF MESH;  Surgeon: Rolm Bookbinder, MD;  Location: [place];  Service: General;  Laterality: Left;  Marland Kitchen MAZE N/A 05/18/2018   Procedure: MAZE;  Surgeon: Rexene Alberts, MD;  Location: [place];  Service: Open Heart Surgery;  Laterality: N/A;  . MITRAL VALVE REPAIR N/A 05/18/2018   Procedure: MITRAL VALVE REPAIR (MVR);  Surgeon: Rexene Alberts, MD;  Location: [facility];  Service: Open Heart Surgery;  Laterality: N/A;  Using Memo 4D Semirigid Annuloplasty  Ring Size 34  . RIGHT/LEFT HEART CATH AND CORONARY ANGIOGRAPHY N/A 04/22/2018   Procedure: RIGHT/LEFT HEART CATH AND CORONARY ANGIOGRAPHY;  Surgeon: Yates DecampGanji, Dago Jungwirth, MD;  Location: [facility];  Service: Cardiovascular;  Laterality: N/A;  . ROTATOR CUFF REPAIR    . TEE WITHOUT CARDIOVERSION N/A 02/04/2016   Procedure: TRANSESOPHAGEAL ECHOCARDIOGRAM (TEE);  Surgeon: Yates DecampJay Jearld Hemp, MD;  [facility];  Service: Cardiovascular;  Laterality: N/A;  . TEE WITHOUT CARDIOVERSION N/A 04/22/2018   Procedure: TRANSESOPHAGEAL ECHOCARDIOGRAM (TEE);   Surgeon: Yates DecampGanji, Sherron Mummert, MD;  [facility];  Service: Cardiovascular;  Laterality: N/A;  . TEE WITHOUT CARDIOVERSION N/A 05/18/2018   Procedure: TRANSESOPHAGEAL ECHOCARDIOGRAM (TEE);  Surgeon: Purcell Nailswen, Clarence H, MD;  [facility];  Service: Open Heart Surgery;  Laterality: N/A;    Social History   Socioeconomic History  . Marital status: Divorced    Spouse name: Not on file  . Number of children: 2  . Years of education: Not on file  . Highest education level: Not on file  Occupational History  . Not on file  Social Needs  . Financial resource strain: Not on file  . Food insecurity    Worry: Not on file    Inability: Not on file  . Transportation needs    Medical: Not on file    Non-medical: Not on file  Tobacco Use  . Smoking status: Current Every Day Smoker    Packs/day: 0.50    Years: 30.00    Pack years: 15.00    Types: Cigarettes  . Smokeless tobacco: Never Used  Substance and Sexual Activity  . Alcohol use: Yes    Alcohol/week: 14.0 standard drinks    Types: 14 Cans of beer per week    Comment:  " 2-3 beers daily "  . Drug use: Yes  . Sexual activity: Not on file  Lifestyle  . Physical activity    Days per week: Not on file    Minutes per session: Not on file  . Stress: Not on file  Relationships  . Social Musicianconnections    Talks on phone: Not on file    Gets together: Not on file    Attends religious service: Not on file    Active member of club or organization: Not on file    Attends meetings of clubs or organizations: Not on file    Relationship status: Not on file  . Intimate partner violence    Fear of current or ex partner: Not on file    Emotionally abused: Not on file    Physically abused: Not on file    Forced sexual activity: Not on file  Other Topics Concern  . Not on file  Social History Narrative  . Not on file    Review of Systems  Constitution: Negative for chills, decreased appetite, malaise/fatigue and weight gain.   Cardiovascular: Positive for chest pain (sharp pain lasts few seconds) and palpitations (occasional). Negative for dyspnea on exertion, leg swelling and syncope.  Endocrine: Negative for cold intolerance.  Hematologic/Lymphatic: Does not bruise/bleed easily.  Musculoskeletal: Positive for back pain. Negative for joint swelling.  Gastrointestinal: Negative for abdominal pain, anorexia, change in bowel habit, hematochezia and melena.  Neurological: Negative for headaches and light-headedness.  Psychiatric/Behavioral: Negative for depression and substance abuse.  All other systems reviewed and are negative.     Objective  Blood pressure 116/80, pulse 85, height 6' (1.829 m), weight 173 lb (78.5 kg), SpO2 98 %. Body mass index  is 23.46 kg/m.    Physical Exam  Constitutional: He appears well-developed and well-nourished. No distress.  HENT:  Head: Atraumatic.  Eyes: Conjunctivae are normal.  Neck: Neck supple. No JVD present. No thyromegaly present.  Cardiovascular: Intact distal pulses and normal pulses. An irregularly irregular rhythm present. Exam reveals no gallop, no S3 and no S4.  No murmur heard. S1 is variable, S2 is normal.   Pulmonary/Chest: Effort normal and breath sounds normal.  Abdominal: Soft. Bowel sounds are normal.  Musculoskeletal: Normal range of motion.        General: Deformity present. No edema.  Neurological: He is alert.  Skin: Skin is warm and dry.  Psychiatric: He has a normal mood and affect.   Radiology: No results found.  Laboratory examination:    CMP Latest Ref Rng & Units 05/26/2018 05/24/2018 05/23/2018  Glucose 70 - 99 mg/dL 161(W114(H) 960(A106(H) 540(J107(H)  BUN 6 - 20 mg/dL 8 10 10   Creatinine 0.61 - 1.24 mg/dL 8.110.74 9.14(N0.54(L) 8.290.69  Sodium 135 - 145 mmol/L 131(L) 131(L) 131(L)  Potassium 3.5 - 5.1 mmol/L 4.5 4.2 4.2  Chloride 98 - 111 mmol/L 94(L) 93(L) 94(L)  CO2 22 - 32 mmol/L 28 27 28   Calcium 8.9 - 10.3 mg/dL 5.6(O8.8(L) 1.3(Y8.6(L) 8.1(L)  Total Protein 6.5 -  8.1 g/dL - - -  Total Bilirubin 0.3 - 1.2 mg/dL - - -  Alkaline Phos 38 - 126 U/L - - -  AST 15 - 41 U/L - - -  ALT 0 - 44 U/L - - -   CBC Latest Ref Rng & Units 05/24/2018 05/23/2018 05/22/2018  WBC 4.0 - 10.5 K/uL 6.4 7.9 8.9  Hemoglobin 13.0 - 17.0 g/dL 10.7(L) 10.4(L) 12.3(L)  Hematocrit 39.0 - 52.0 % 32.7(L) 32.3(L) 37.6(L)  Platelets 150 - 400 K/uL 167 129(L) 116(L)   HEMOGLOBIN A1C Lab Results  Component Value Date   HGBA1C 5.4 05/16/2018   MPG 108.28 05/16/2018   TSH No results for input(s): TSH in the last 8760 hours.   Medications   Medications Discontinued During This Encounter  Medication Reason  . omega-3 acid ethyl esters (LOVAZA) 1 g capsule Error  . warfarin (COUMADIN) 5 MG tablet Error  . furosemide (LASIX) 40 MG tablet Discontinued by provider  . potassium chloride SA (K-DUR) 20 MEQ tablet Discontinued by provider  . nitroGLYCERIN (NITROSTAT) 0.4 MG SL tablet Reorder  . lisinopril (PRINIVIL,ZESTRIL) 20 MG tablet Reorder  . XARELTO 20 MG TABS tablet Reorder  . lovastatin (MEVACOR) 20 MG tablet Reorder   Current Meds  Medication Sig  . Ascorbic Acid (VITAMIN C) 1000 MG tablet Take 1,000 mg by mouth daily.  Marland Kitchen. lisinopril (ZESTRIL) 10 MG tablet Take 1 tablet (10 mg total) by mouth daily.  Marland Kitchen. lovastatin (MEVACOR) 20 MG tablet Take 1 tablet (20 mg total) by mouth at bedtime.  . Multiple Vitamin (MULTIVITAMIN) capsule Take 1 capsule by mouth daily.  . nitroGLYCERIN (NITROSTAT) 0.4 MG SL tablet Place 1 tablet (0.4 mg total) under the tongue every 5 (five) minutes as needed for chest pain.  . rivaroxaban (XARELTO) 20 MG TABS tablet Take 1 tablet (20 mg total) by mouth daily with supper.  . [DISCONTINUED] furosemide (LASIX) 40 MG tablet TAKE 1 TABLET ONCE DAILY.  . [DISCONTINUED] lisinopril (PRINIVIL,ZESTRIL) 20 MG tablet Take 1 tablet (20 mg total) by mouth daily.  . [DISCONTINUED] lovastatin (MEVACOR) 20 MG tablet Take 1 tablet (20 mg total) by mouth at bedtime.  .  [DISCONTINUED] nitroGLYCERIN (NITROSTAT) 0.4  MG SL tablet Place 1 tablet (0.4 mg total) under the tongue every 5 (five) minutes as needed for chest pain.  . [DISCONTINUED] potassium chloride SA (K-DUR) 20 MEQ tablet TAKE 1 TABLET ONCE DAILY.  . [DISCONTINUED] XARELTO 20 MG TABS tablet TAKE 1 TABLET ONCE DAILY IN THE EVENING AFTER DINNER.    Cardiac Studies:   Echocardiogram 06/30/2018: Left ventricle cavity is normal in size. Moderate concentric hypertrophy of the left ventricle. Mild decrease in global wall motion. Visual EF is 45-50%. Calculated EF 50%. Left atrial cavity is severely dilated. S/p mitral valve repair with mild eccentric residual mitral regurgitation seen. No significant mitral stenosis. Mild tricuspid regurgitation.  No evidence of pulmonary hypertension. Compared to previous study on 03/29/2018, mitral valve repair is new.  Coronary angiogram 04/22/2018: Patent stents placed for acute inferior MI on 12/23/2010 S/P mid RCA non drug eluting stent implantation, staged proximal LAD DES stent 12/26/2010. PL branch of RCA chronically occluded with collaterals from LAD. Mid LAD distal to stent 50% stenosis. Mild pulmonary hypertension.  CABG and MV Repair and Maze and LAA clipping 05/18/2018: SVG to RCA, Sorin Memo 4D annuloplasty 34 mm ring by Ashley Mariner, MD  Abdominal aortic duplex 01/20/2018: No Abdominal aortic aneurysm noted. Maximum diameter of the abdominal aorta is 2.44 cm. AAA repair appears stable without endo-leak. Enlarged inferior vena cava, suggests elevated central venous pressure  Assessment   Coronary artery disease involving native coronary artery of native heart without angina pectoris - 2012: Mid RCA and LAD stenting. CABG 05/18/18: SVG to RCA, MV repair, Maze and LAA clipping - Plan: EKG 12-Lead, nitroGLYCERIN (NITROSTAT) 0.4 MG SL tablet  S/P mitral valve repair - Plan: amoxicillin (AMOXIL) 500 MG tablet  Permanent atrial fibrillation - CHA2DS2-VASc Score is  2.  Yearly risk of stroke: 2.3% - Plan: metoprolol succinate (TOPROL-XL) 50 MG 24 hr tablet, rivaroxaban (XARELTO) 20 MG TABS tablet  SBE (subacute bacterial endocarditis) prophylaxis candidate Plan: amoxicillin (AMOXIL) 500 MG tablet,   Essential hypertension - Plan: lisinopril (ZESTRIL) 10 MG tablet  S/P AAA repair using straight graft -  Endovascular stent graft repair on 11/29/2014 - Plan: PCV AORTA DUPLEX  Hypercholesteremia - Plan: lovastatin (MEVACOR) 20 MG tablet  EKG 12/05/2018: Atrial fibrillation with rapid ventricular response at the rate of 111 bpm, normal axis, IVCD, LVH.  Normal QT interval.  No evidence of ischemia. No significant change from  EKG 10/04/2017: Atrial fibrillation with controlled response at the rate of 66 bpm.  Recommendations:   Earl Martin is a Caucasian male with CAD, severe MR noted in June 2018, hypertension, hyperlipidemia, chronic atrial fibrillation and chronic back pain due to spinal stenosis.   He is tolerating anticoagulation without any bleeding complications. He also has abdominal aortic aneurysm status post repair on 11/29/2014 by placement of stent graft by Dr. Durene Cal. Surveillance of aneurysm repair duplex ordered today.   Patient is here on a 6 month follow-up of mitral valve repair and single-vessel coronary bypass surgery performed on 05/18/2018, presently doing well, although had maze procedure and left atrial appendage clipping, he is in atrial fibrillation, do not think that he will maintain sinus rhythm in view of very large left atrial size.  He is going to be seeing Dr. Tressie Stalker today, unless Dr. Cornelius Moras feels that I should attempt cardioversion, would probably leave him in chronic atrial fibrillation as he is asymptomatic.  Also his cardioversion was brought to to  Covid-9 previously.  Blood pressure is well controlled, no  clinical evidence of heart failure. He is aware that he needs endocarditis prophylaxis. I have  discontinued furosemide and potassium supplements, although his heart rate on EKG was elevated, resting heart rate was 85 bpm.  He was previously on metoprolol succinate which was held due to low blood pressure, I will restart his metoprolol succinate to 50 mg daily.  I will see him back in 6 months for follow-up.  I have refilled all his medications.  He needs dental workup, amoxicillin was prescribed. "Total time spent with patient was  40 minutes and greater than 50% of that time was spent in face to face discussion, counseling and coordination care"   Yates DecampJay Shaquoya Cosper, MD, [facility] LtdFACC 12/05/2018, 12:57 PM [facility] Pager: [phone] Office: [phone] If no answer Cell [phone]

## 2018-12-05 NOTE — Patient Instructions (Addendum)
Continue all previous medications without any changes at this time  Stop smoking immediately and permanently.  Endocarditis is a potentially serious infection of heart valves or inside lining of the heart.  It occurs more commonly in patients with diseased heart valves (such as patient's with aortic or mitral valve disease) and in patients who have undergone heart valve repair or replacement.  Certain surgical and dental procedures may put you at risk, such as dental cleaning, other dental procedures, or any surgery involving the respiratory, urinary, gastrointestinal tract, gallbladder or prostate gland.   To minimize your chances for develooping endocarditis, maintain good oral health and seek prompt medical attention for any infections involving the mouth, teeth, gums, skin or urinary tract.    Always notify your doctor or dentist about your underlying heart valve condition before having any invasive procedures. You will need to take antibiotics before certain procedures, including all routine dental cleanings or other dental procedures.  Your cardiologist or dentist should prescribe these antibiotics for you to be taken ahead of time.      

## 2018-12-05 NOTE — Progress Notes (Signed)
Earl Martin       [address] [place] [address]             [phone]     CARDIOTHORACIC SURGERY OFFICE NOTE  Referring Provider is Earl Prows, MD PCP is Earl Panda, MD   HPI:  Patient is a 58 year old male with history of coronary artery disease status post inferolateral wall myocardial infarction in 2012 status post multivessel PCI and stenting at that time, mitral regurgitation, hypertension, long-standing persistent atrial fibrillation, obstructive sleep apnea, abdominal aortic aneurysm status post endovascular repair in 2016, and long-standing tobacco abuse who returns to the office today for routine follow-up status post mitral valve repair, coronary artery bypass grafting x1, and Maze procedure on May 18, 2018.    His postoperative recovery was uneventful and he was last seen here in our office on June 30, 2018 at which time he was doing well.  At that time he was in rate controlled atrial fibrillation.  Since then he underwent routine follow-up echocardiogram at Dr. Irven Martin office on June 30, 2018.  By report the patient's postoperative echocardiogram revealed mild left ventricular systolic dysfunction with ejection fraction estimated 45 to 50%.  The calculated EF was 50%.  The mitral valve repair appeared intact with mild (1+) residual mitral regurgitation.  There was no significant mitral stenosis although the transvalvular gradient is not currently available.  The patient returns to our office today for routine follow-up.  He reports that he is now feeling much better than he has in many years.  He exercises 6 days a week and frequently performs fairly strenuous exertion.  He reports no limitations at all.  He specifically denies exertional shortness of breath or chest discomfort.  Unfortunately he has gone back to smoking cigarettes.  He remains chronically anticoagulated using Xarelto.  He is not currently on a beta-blocker or amiodarone.  He was seen by Dr.  Einar Martin earlier today and the possibility of DC cardioversion was discussed.  Overall the patient reports that he is exceptionally pleased with his outcome and he feels very well.   Current Outpatient Medications  Medication Sig Dispense Refill   amoxicillin (AMOXIL) 500 MG tablet Take 4 tablets (2,000 mg total) by mouth once for 1 dose. 4 tablet 4   Ascorbic Acid (VITAMIN C) 1000 MG tablet Take 1,000 mg by mouth daily.     lisinopril (ZESTRIL) 10 MG tablet Take 1 tablet (10 mg total) by mouth daily. 90 tablet 3   lovastatin (MEVACOR) 20 MG tablet Take 1 tablet (20 mg total) by mouth at bedtime. 90 tablet 3   metoprolol succinate (TOPROL-XL) 50 MG 24 hr tablet Take 1 tablet (50 mg total) by mouth daily. Take with or immediately following a meal. 90 tablet 3   Multiple Vitamin (MULTIVITAMIN) capsule Take 1 capsule by mouth daily.     nitroGLYCERIN (NITROSTAT) 0.4 MG SL tablet Place 1 tablet (0.4 mg total) under the tongue every 5 (five) minutes as needed for chest pain. 25 tablet 2   rivaroxaban (XARELTO) 20 MG TABS tablet Take 1 tablet (20 mg total) by mouth daily with supper. 90 tablet 3   No current facility-administered medications for this visit.       Physical Exam:   Ht 6' (1.829 m)    BMI 23.46 kg/m   General:  Well-appearing  Chest:   Clear to auscultation  CV:   Irregular rate and rhythm without murmur  Incisions:  Completely healed, patient does  have a very small epigastric hernia overlying the xiphoid process that is nonpainful  Abdomen:  Soft nontender  Extremities:  Warm and well-perfused  Diagnostic Tests:  2 channel telemetry rhythm strip demonstrates atrial fibrillation with heart rate approximately 90 bpm   Impression:  Patient is clinically doing very well although he remains in persistent atrial fibrillation approximately 6 months status post mitral valve repair, coronary artery bypass grafting, and Maze procedure.  Unfortunately the patient is gone back  to cigarette smoking.    Plan:  I have strongly encouraged the patient to find a way to try again to quit smoking.  We discussed the importance for his long-term health and prognostic benefit.  We have not recommended any changes to the patient's current medications.  However, I think given his young age it would be reasonable to be aggressive about trying to get him back into sinus rhythm.  I would recommend proceeding with DC cardioversion.  It might be reasonable to load him with amiodarone prior to cardioversion.  If he fails cardioversion I think it would be reasonable to consider referral to the atrial fibrillation clinic for possible catheter-based ablation.  The patient has been reminded regarding the importance of dental hygiene and the lifelong need for antibiotic prophylaxis for all dental cleanings and other related invasive procedures.  The patient will return to our office in approximately 6 months for routine follow-up and rhythm check.  All of his questions have been addressed.  I spent in excess of 15 minutes during the conduct of this office consultation and >50% of this time involved direct face-to-face encounter with the patient for counseling and/or coordination of their care.    Earl Decentlarence H. Cornelius Moraswen, MD 12/05/2018 1:55 PM

## 2018-12-06 ENCOUNTER — Encounter: Payer: Self-pay | Admitting: Cardiology

## 2018-12-28 ENCOUNTER — Other Ambulatory Visit: Payer: Self-pay | Admitting: Cardiology

## 2018-12-28 DIAGNOSIS — I4811 Longstanding persistent atrial fibrillation: Secondary | ICD-10-CM

## 2019-01-06 ENCOUNTER — Other Ambulatory Visit: Payer: Self-pay

## 2019-01-06 ENCOUNTER — Other Ambulatory Visit: Payer: Self-pay | Admitting: Cardiology

## 2019-01-06 DIAGNOSIS — I10 Essential (primary) hypertension: Secondary | ICD-10-CM

## 2019-01-06 MED ORDER — LISINOPRIL 10 MG PO TABS: 10.0000 mg | ORAL_TABLET | Freq: Every day | ORAL | 3 refills | Status: DC

## 2019-01-06 MED ORDER — LISINOPRIL 10 MG PO TABS: 10.0000 mg | ORAL_TABLET | Freq: Every day | ORAL | 0 refills | Status: DC

## 2019-01-09 ENCOUNTER — Other Ambulatory Visit: Payer: Self-pay | Admitting: Cardiology

## 2019-01-09 DIAGNOSIS — I4811 Longstanding persistent atrial fibrillation: Secondary | ICD-10-CM

## 2019-01-11 LAB — BASIC METABOLIC PANEL
BUN/Creatinine Ratio: 12 (ref 9–20)
BUN: 8 mg/dL (ref 6–24)
CO2: 22 mmol/L (ref 20–29)
Calcium: 9.3 mg/dL (ref 8.7–10.2)
Chloride: 99 mmol/L (ref 96–106)
Creatinine, Ser: 0.69 mg/dL — ABNORMAL LOW (ref 0.76–1.27)
GFR calc Af Amer: 120 mL/min/{1.73_m2} (ref >59)
GFR calc non Af Amer: 104 mL/min/{1.73_m2} (ref >59)
Glucose: 89 mg/dL (ref 65–99)
Potassium: 4.8 mmol/L (ref 3.5–5.2)
Sodium: 137 mmol/L (ref 134–144)

## 2019-01-17 ENCOUNTER — Encounter (HOSPITAL_COMMUNITY)
Admission: RE | Disposition: A | Discharge status: Home / Self Care | Payer: Self-pay | Source: Home / Self Care | Attending: Cardiology

## 2019-01-17 ENCOUNTER — Other Ambulatory Visit: Payer: Self-pay

## 2019-01-17 ENCOUNTER — Ambulatory Visit (HOSPITAL_COMMUNITY): Payer: BLUE CROSS/BLUE SHIELD | Admitting: Anesthesiology

## 2019-01-17 ENCOUNTER — Other Ambulatory Visit (HOSPITAL_COMMUNITY): Payer: BLUE CROSS/BLUE SHIELD

## 2019-01-17 ENCOUNTER — Ambulatory Visit (HOSPITAL_COMMUNITY)
Admission: RE | Admit: 2019-01-17 | Discharge: 2019-01-17 | Disposition: A | Discharge status: Home / Self Care | Payer: BLUE CROSS/BLUE SHIELD | Attending: Cardiology | Admitting: Cardiology

## 2019-01-17 ENCOUNTER — Encounter (HOSPITAL_COMMUNITY): Payer: Self-pay | Admitting: Emergency Medicine

## 2019-01-17 DIAGNOSIS — I10 Essential (primary) hypertension: Secondary | ICD-10-CM | POA: Insufficient documentation

## 2019-01-17 DIAGNOSIS — I4819 Other persistent atrial fibrillation: Secondary | ICD-10-CM

## 2019-01-17 DIAGNOSIS — I4821 Permanent atrial fibrillation: Secondary | ICD-10-CM | POA: Insufficient documentation

## 2019-01-17 DIAGNOSIS — F17210 Nicotine dependence, cigarettes, uncomplicated: Secondary | ICD-10-CM | POA: Insufficient documentation

## 2019-01-17 DIAGNOSIS — E785 Hyperlipidemia, unspecified: Secondary | ICD-10-CM | POA: Diagnosis not present

## 2019-01-17 DIAGNOSIS — G4730 Sleep apnea, unspecified: Secondary | ICD-10-CM | POA: Insufficient documentation

## 2019-01-17 DIAGNOSIS — I2510 Atherosclerotic heart disease of native coronary artery without angina pectoris: Secondary | ICD-10-CM | POA: Diagnosis not present

## 2019-01-17 DIAGNOSIS — Z1159 Encounter for screening for other viral diseases: Secondary | ICD-10-CM | POA: Insufficient documentation

## 2019-01-17 DIAGNOSIS — G8929 Other chronic pain: Secondary | ICD-10-CM | POA: Diagnosis not present

## 2019-01-17 DIAGNOSIS — Z7901 Long term (current) use of anticoagulants: Secondary | ICD-10-CM | POA: Insufficient documentation

## 2019-01-17 DIAGNOSIS — E7800 Pure hypercholesterolemia, unspecified: Secondary | ICD-10-CM | POA: Diagnosis not present

## 2019-01-17 DIAGNOSIS — M4800 Spinal stenosis, site unspecified: Secondary | ICD-10-CM | POA: Diagnosis not present

## 2019-01-17 DIAGNOSIS — Z955 Presence of coronary angioplasty implant and graft: Secondary | ICD-10-CM | POA: Diagnosis not present

## 2019-01-17 DIAGNOSIS — Z79899 Other long term (current) drug therapy: Secondary | ICD-10-CM | POA: Diagnosis not present

## 2019-01-17 DIAGNOSIS — I252 Old myocardial infarction: Secondary | ICD-10-CM | POA: Diagnosis not present

## 2019-01-17 HISTORY — PX: CARDIOVERSION: SHX1299

## 2019-01-17 LAB — SARS CORONAVIRUS 2 BY RT PCR (HOSPITAL ORDER, PERFORMED IN ~~LOC~~ HOSPITAL LAB): SARS Coronavirus 2: NEGATIVE

## 2019-01-17 SURGERY — CARDIOVERSION
Anesthesia: General

## 2019-01-17 MED ORDER — PROPOFOL 10 MG/ML IV BOLUS
INTRAVENOUS | Status: DC | PRN
  Administered 2019-01-17: 50 mg via INTRAVENOUS
  Administered 2019-01-17: 20 mg via INTRAVENOUS
  Administered 2019-01-17: 50 mg via INTRAVENOUS

## 2019-01-17 MED ORDER — SODIUM CHLORIDE 0.9 % IV SOLN
INTRAVENOUS | Status: AC | PRN
  Administered 2019-01-17: 500 mL via INTRAMUSCULAR

## 2019-01-17 MED ORDER — LIDOCAINE 2% (20 MG/ML) 5 ML SYRINGE
INTRAMUSCULAR | Status: DC | PRN
  Administered 2019-01-17: 60 mg via INTRAVENOUS

## 2019-01-17 MED ORDER — AMIODARONE HCL 200 MG PO TABS
400.0000 mg | ORAL_TABLET | Freq: Every day | ORAL | Status: AC
  Administered 2019-01-17: 400 mg via ORAL
  Filled 2019-01-17: qty 2

## 2019-01-17 MED ORDER — AMIODARONE HCL 200 MG PO TABS: 200.0000 mg | ORAL_TABLET | Freq: Every day | ORAL | 1 refills | Status: DC

## 2019-01-17 NOTE — Anesthesia Preprocedure Evaluation (Addendum)
Anesthesia Evaluation  Patient identified by MRN, date of birth, ID band Patient awake    Reviewed: Allergy & Precautions, NPO status , Patient's Chart, lab work & pertinent test results  Airway Mallampati: II  TM Distance: >3 FB Neck ROM: Full    Dental  (+) Teeth Intact, Dental Advisory Given, Implants, Loose,  Loose implant :   Pulmonary sleep apnea , Current Smoker,    Pulmonary exam normal breath sounds clear to auscultation       Cardiovascular hypertension, Pt. on home beta blockers and Pt. on medications + CAD and + Past MI  + dysrhythmias Atrial Fibrillation + Valvular Problems/Murmurs (s/p MVR)  Rhythm:Irregular Rate:Abnormal     Neuro/Psych  Headaches,    GI/Hepatic negative GI ROS, Neg liver ROS,   Endo/Other  negative endocrine ROS  Renal/GU negative Renal ROS     Musculoskeletal negative musculoskeletal ROS (+)   Abdominal   Peds  Hematology  (+) Blood dyscrasia (Xarelto), ,   Anesthesia Other Findings Day of surgery medications reviewed with the patient.  Reproductive/Obstetrics                            Anesthesia Physical Anesthesia Plan  ASA: III  Anesthesia Plan: General   Post-op Pain Management:    Induction: Intravenous  PONV Risk Score and Plan: 1  Airway Management Planned: Mask  Additional Equipment:   Intra-op Plan:   Post-operative Plan:   Informed Consent: I have reviewed the patients History and Physical, chart, labs and discussed the procedure including the risks, benefits and alternatives for the proposed anesthesia with the patient or authorized representative who has indicated his/her understanding and acceptance.     Dental advisory given  Plan Discussed with: CRNA  Anesthesia Plan Comments:         Anesthesia Quick Evaluation

## 2019-01-17 NOTE — Interval H&P Note (Signed)
History and Physical Interval Note:  01/17/2019 1:57 PM  Earl Martin  has presented today for surgery, with the diagnosis of ATRIAL FIBRILLATION.  The various methods of treatment have been discussed with the patient and family. After consideration of risks, benefits and other options for treatment, the patient has consented to  Procedure(s): CARDIOVERSION (N/A) as a surgical intervention.  The patient's history has been reviewed, patient examined, no change in status, stable for surgery.  I have reviewed the patient's chart and labs.  Questions were answered to the patient's satisfaction.     Adrian Prows

## 2019-01-17 NOTE — Discharge Instructions (Signed)
YOU HAD AN CARDIAC PROCEDURE TODAY: Refer to the procedure report and other information in the discharge instructions given to you for any specific questions about what was found during the examination. If this information does not answer your questions, please call [facility] office at [phone] to clarify.  ° °DIET: Your first meal following the procedure should be a light meal and then it is ok to progress to your normal diet. A half-sandwich or bowl of soup is an example of a good first meal. Heavy or fried foods are harder to digest and may make you feel nauseous or bloated. Drink plenty of fluids but you should avoid alcoholic beverages for 24 hours. If you had a esophageal dilation, please see attached instructions for diet.  ° °ACTIVITY: Your care partner should take you home directly after the procedure. You should plan to take it easy, moving slowly for the rest of the day. You can resume normal activity the day after the procedure however YOU SHOULD NOT DRIVE, use power tools, machinery or perform tasks that involve climbing or major physical exertion for 24 hours (because of the sedation medicines used during the test).  ° °SYMPTOMS TO REPORT IMMEDIATELY: °A cardiologist can be reached at any hour. Please call [phone] for any of the following symptoms:  °Vomiting of blood or coffee ground material  °New, significant abdominal pain  °New, significant chest pain or pain under the shoulder blades  °Painful or persistently difficult swallowing  °New shortness of breath  °Black, tarry-looking or red, bloody stools ° °FOLLOW UP:  °Please also call with any specific questions about appointments or follow up tests. ° ° °

## 2019-01-17 NOTE — Transfer of Care (Signed)
Immediate Anesthesia Transfer of Care Note  Patient: Tanna Savoy  Procedure(s) Performed: CARDIOVERSION (N/A )  Patient Location: PACU and Endoscopy Unit  Anesthesia Type:General  Level of Consciousness: drowsy, patient cooperative and responds to stimulation  Airway & Oxygen Therapy: Patient Spontanous Breathing  Post-op Assessment: Report given to RN and Post -op Vital signs reviewed and stable  Post vital signs: Reviewed and stable  Last Vitals:  Vitals Value Taken Time  BP    Temp    Pulse    Resp    SpO2      Last Pain:  Vitals:   01/17/19 1136  TempSrc: Oral  PainSc: 0-No pain         Complications: No apparent anesthesia complications

## 2019-01-17 NOTE — H&P (View-Only) (Signed)
Primary Physician/Referring:  Ralene OkMoreira, [name], MD  Patient ID: Earl EvenerWilliam E Kluender, male    DOB: 05/01/1961, 58 y.o.   MRN: [id number]  CC" Atrial fibrillation for cardioversion  HPI: Earl Martin  is a 58 y.o. male  with Chrissie NoaWilliam "[name]" Theone MurdochVarker is a Caucasian male with severe mitral regurgitation, underwent CABG and MV Repair and Maze and LAA clipping  05/18/2018: SVG to RCA, Sorin Memo 4D annuloplasty 34 mm ring by Ashley Marinerub Owen, MD.  He has coronary artery disease with inferior MI in 2012 S/P mid RCA stenting and staged stenting to LAD in July 2012, patent by angiography on 04/2018, abdominal aortic aneurysm status post repair on 11/29/2014 by placement of stent graft by Dr. Durene CalWells Brabham.  Hypertension, hyperlipidemia, chronic atrial fibrillation and chronic back pain due to spinal stenosis. He is tolerating anticoagulation without any bleeding complications. He is a cigarette smoker unfortunately.  1/2 PPD. He remains asymptomatic and exercises daily. He has not used S/L NTG.   He is now scheduled for cardioversion. No new complaints.  Past Medical History:  Diagnosis Date  . Atrial fibrillation, persistent   . Complication of anesthesia    belligerant when awakes  . Coronary artery disease   . Dysrhythmia   . Headache   . Heart murmur   . Hypercholesterolemia   . Hypertension    essential  . Mitral regurgitation   . Myocardial infarction (HCC) 12/25/2010  . Pneumonia    hx  . S/P CABG x 1 05/18/2018   SVG to RPL, EVH via right thigh  . S/P Maze operation for atrial fibrillation 05/18/2018   Complete bilateral atrial lesion set using bipolar radiofrequency and cryothermy ablation with clipping of LA appendage  . S/P mitral valve repair 05/18/2018   34 mm Sorin Memo 4D ring annuloplasty  . Sleep apnea    does not use cpap  . Spinal stenosis     Past Surgical History:  Procedure Laterality Date  . ABDOMINAL AORTIC ENDOVASCULAR STENT GRAFT N/A 11/29/2014   Procedure: ABDOMINAL AORTIC  ENDOVASCULAR STENT GRAFT;  Surgeon: Nada LibmanVance W Brabham, MD;  Location: [facility] OR;  Service: Vascular;  Laterality: N/A;  . CARDIAC CATHETERIZATION    . CARDIOVERSION N/A 09/20/2012   Procedure: CARDIOVERSION;  Surgeon: Pamella PertJagadeesh R Tamikka Pilger, MD;  [facility];  Service: Cardiovascular;  Laterality: N/A;  . CORONARY ARTERY BYPASS GRAFT N/A 05/18/2018   Procedure: CORONARY ARTERY BYPASS GRAFTING (CABG), ON PUMP, TIMES ONE, USING ENDOSCOPICALLY HARVESTED RIGHT GREATER SAPHENOUS VEIN;  Surgeon: Purcell Nailswen, Clarence H, MD;  [facility];  Service: Open Heart Surgery;  Laterality: N/A;  . CORONARY STENT PLACEMENT  12/25/2010   RCA  . CORONARY STENT PLACEMENT  12/26/2010   LAD  . HAND NERVE GRAFT    . INGUINAL HERNIA REPAIR Left 03/07/2013   Procedure: LEFT INGUINAL HERNIA REPAIR;  Surgeon: Emelia LoronMatthew Wakefield, MD;  [facility];  Service: General;  Laterality: Left;  . INSERTION OF MESH Left 03/07/2013   Procedure: INSERTION OF MESH;  Surgeon: Emelia LoronMatthew Wakefield, MD;  [facility];  Service: General;  Laterality: Left;  Marland Kitchen. MAZE N/A 05/18/2018   Procedure: MAZE;  Surgeon: Purcell Nailswen, Clarence H, MD;  [facility];  Service: Open Heart Surgery;  Laterality: N/A;  . MITRAL VALVE REPAIR N/A 05/18/2018   Procedure: MITRAL VALVE REPAIR (MVR);  Surgeon: Purcell Nailswen, Clarence H, MD;  [facility];  Service: Open Heart Surgery;  Laterality: N/A;  Using Memo 4D Semirigid Annuloplasty Ring Size 34  .  RIGHT/LEFT HEART CATH AND CORONARY ANGIOGRAPHY N/A 04/22/2018   Procedure: RIGHT/LEFT HEART CATH AND CORONARY ANGIOGRAPHY;  Surgeon: Yates DecampGanji, Coral Soler, MD;  Location: [facility] INVASIVE CV LAB;  Service: Cardiovascular;  Laterality: N/A;  . ROTATOR CUFF REPAIR    . TEE WITHOUT CARDIOVERSION N/A 02/04/2016   Procedure: TRANSESOPHAGEAL ECHOCARDIOGRAM (TEE);  Surgeon: Yates DecampJay Heidi Maclin, MD;  [facility];  Service: Cardiovascular;  Laterality: N/A;  . TEE WITHOUT CARDIOVERSION N/A 04/22/2018   Procedure: TRANSESOPHAGEAL ECHOCARDIOGRAM (TEE);   Surgeon: Yates DecampGanji, Holley Wirt, MD;  [facility];  Service: Cardiovascular;  Laterality: N/A;  . TEE WITHOUT CARDIOVERSION N/A 05/18/2018   Procedure: TRANSESOPHAGEAL ECHOCARDIOGRAM (TEE);  Surgeon: Purcell Nailswen, Clarence H, MD;  [facility];  Service: Open Heart Surgery;  Laterality: N/A;    Social History   Socioeconomic History  . Marital status: Divorced    Spouse name: Not on file  . Number of children: 2  . Years of education: Not on file  . Highest education level: Not on file  Occupational History  . Not on file  Social Needs  . Financial resource strain: Not on file  . Food insecurity    Worry: Not on file    Inability: Not on file  . Transportation needs    Medical: Not on file    Non-medical: Not on file  Tobacco Use  . Smoking status: Current Every Day Smoker    Packs/day: 0.50    Years: 30.00    Pack years: 15.00    Types: Cigarettes  . Smokeless tobacco: Never Used  Substance and Sexual Activity  . Alcohol use: Yes    Alcohol/week: 14.0 standard drinks    Types: 14 Cans of beer per week    Comment:  " 2-3 beers daily "  . Drug use: Yes  . Sexual activity: Not on file  Lifestyle  . Physical activity    Days per week: Not on file    Minutes per session: Not on file  . Stress: Not on file  Relationships  . Social Musicianconnections    Talks on phone: Not on file    Gets together: Not on file    Attends religious service: Not on file    Active member of club or organization: Not on file    Attends meetings of clubs or organizations: Not on file    Relationship status: Not on file  . Intimate partner violence    Fear of current or ex partner: Not on file    Emotionally abused: Not on file    Physically abused: Not on file    Forced sexual activity: Not on file  Other Topics Concern  . Not on file  Social History Narrative  . Not on file    Review of Systems  Constitution: Negative for chills, decreased appetite, malaise/fatigue and weight gain.   Cardiovascular: Positive for chest pain (sharp pain lasts few seconds) and palpitations (occasional). Negative for dyspnea on exertion, leg swelling and syncope.  Endocrine: Negative for cold intolerance.  Hematologic/Lymphatic: Does not bruise/bleed easily.  Musculoskeletal: Positive for back pain. Negative for joint swelling.  Gastrointestinal: Negative for abdominal pain, anorexia, change in bowel habit, hematochezia and melena.  Neurological: Negative for headaches and light-headedness.  Psychiatric/Behavioral: Negative for depression and substance abuse.  All other systems reviewed and are negative.     Objective  Blood pressure (!) 132/96, pulse 80, temperature 97.6 F (36.4 C), temperature source Oral, resp. rate 16, height 6' (1.829 m), weight 77.1 kg, SpO2  98 %. Body mass index is 23.06 kg/m.    Physical Exam  Constitutional: He appears well-developed and well-nourished. No distress.  HENT:  Head: Atraumatic.  Eyes: Conjunctivae are normal.  Neck: Neck supple. No JVD present. No thyromegaly present.  Cardiovascular: Intact distal pulses and normal pulses. An irregularly irregular rhythm present. Exam reveals no gallop, no S3 and no S4.  No murmur heard. S1 is variable, S2 is normal.   Pulmonary/Chest: Effort normal and breath sounds normal.  Abdominal: Soft. Bowel sounds are normal.  Musculoskeletal: Normal range of motion.        General: Deformity present. No edema.  Neurological: He is alert.  Skin: Skin is warm and dry.  Psychiatric: He has a normal mood and affect.   Radiology: No results found.  Laboratory examination:    CMP Latest Ref Rng & Units 01/10/2019 05/26/2018 05/24/2018  Glucose 65 - 99 mg/dL 89 114(H) 106(H)  BUN 6 - 24 mg/dL 8 8 10   Creatinine 0.76 - 1.27 mg/dL 0.69(L) 0.74 0.54(L)  Sodium 134 - 144 mmol/L 137 131(L) 131(L)  Potassium 3.5 - 5.2 mmol/L 4.8 4.5 4.2  Chloride 96 - 106 mmol/L 99 94(L) 93(L)  CO2 20 - 29 mmol/L 22 28 27   Calcium  8.7 - 10.2 mg/dL 9.3 8.8(L) 8.6(L)  Total Protein 6.5 - 8.1 g/dL - - -  Total Bilirubin 0.3 - 1.2 mg/dL - - -  Alkaline Phos 38 - 126 U/L - - -  AST 15 - 41 U/L - - -  ALT 0 - 44 U/L - - -   CBC Latest Ref Rng & Units 05/24/2018 05/23/2018 05/22/2018  WBC 4.0 - 10.5 K/uL 6.4 7.9 8.9  Hemoglobin 13.0 - 17.0 g/dL 10.7(L) 10.4(L) 12.3(L)  Hematocrit 39.0 - 52.0 % 32.7(L) 32.3(L) 37.6(L)  Platelets 150 - 400 K/uL 167 129(L) 116(L)   HEMOGLOBIN A1C Lab Results  Component Value Date   HGBA1C 5.4 05/16/2018   MPG 108.28 05/16/2018   TSH No results for input(s): TSH in the last 8760 hours.   Medications   There are no discontinued medications. Current Meds  Medication Sig  . acetaminophen (TYLENOL) 500 MG tablet Take 1,000 mg by mouth every 8 (eight) hours as needed for moderate pain.  . Ascorbic Acid (VITAMIN C) 1000 MG tablet Take 1,000 mg by mouth daily.  Marland Kitchen lisinopril (ZESTRIL) 10 MG tablet Take 1 tablet (10 mg total) by mouth daily.  Marland Kitchen lovastatin (MEVACOR) 20 MG tablet Take 1 tablet (20 mg total) by mouth at bedtime.  . metoprolol succinate (TOPROL-XL) 50 MG 24 hr tablet Take 1 tablet (50 mg total) by mouth daily. Take with or immediately following a meal.  . Multiple Vitamin (MULTIVITAMIN) capsule Take 1 capsule by mouth daily.  . nitroGLYCERIN (NITROSTAT) 0.4 MG SL tablet Place 1 tablet (0.4 mg total) under the tongue every 5 (five) minutes as needed for chest pain.  . rivaroxaban (XARELTO) 20 MG TABS tablet Take 1 tablet (20 mg total) by mouth daily with supper.    Cardiac Studies:   Echocardiogram 06/30/2018: Left ventricle cavity is normal in size. Moderate concentric hypertrophy of the left ventricle. Mild decrease in global wall motion. Visual EF is 45-50%. Calculated EF 50%. Left atrial cavity is severely dilated. S/p mitral valve repair with mild eccentric residual mitral regurgitation seen. No significant mitral stenosis. Mild tricuspid regurgitation.  No evidence of  pulmonary hypertension. Compared to previous study on 03/29/2018, mitral valve repair is new.  Coronary angiogram 04/22/2018:  Patent stents placed for acute inferior MI on 12/23/2010 S/P mid RCA non drug eluting stent implantation, staged proximal LAD DES stent 12/26/2010. PL branch of RCA chronically occluded with collaterals from LAD. Mid LAD distal to stent 50% stenosis. Mild pulmonary hypertension.  CABG and MV Repair and Maze and LAA clipping 05/18/2018: SVG to RCA, Sorin Memo 4D annuloplasty 34 mm ring by Ashley Marinerub Owen, MD  Abdominal aortic duplex 01/20/2018: No Abdominal aortic aneurysm noted. Maximum diameter of the abdominal aorta is 2.44 cm. AAA repair appears stable without endo-leak. Enlarged inferior vena cava, suggests elevated central venous pressure  Assessment   Coronary artery disease involving native coronary artery of native heart without angina pectoris - 2012: Mid RCA and LAD stenting. CABG 05/18/18: SVG to RCA, MV repair, Maze and LAA clipping - Plan: EKG 12-Lead, nitroGLYCERIN (NITROSTAT) 0.4 MG SL tablet  S/P mitral valve repair - Plan: amoxicillin (AMOXIL) 500 MG tablet  Permanent atrial fibrillation - CHA2DS2-VASc Score is 2.  Yearly risk of stroke: 2.3% - Plan: metoprolol succinate (TOPROL-XL) 50 MG 24 hr tablet, rivaroxaban (XARELTO) 20 MG TABS tablet  SBE (subacute bacterial endocarditis) prophylaxis candidate Plan: amoxicillin (AMOXIL) 500 MG tablet,   Essential hypertension - Plan: lisinopril (ZESTRIL) 10 MG tablet  S/P AAA repair using straight graft -  Endovascular stent graft repair on 11/29/2014 - Plan: PCV AORTA DUPLEX  Hypercholesteremia - Plan: lovastatin (MEVACOR) 20 MG tablet  EKG 12/05/2018: Atrial fibrillation with rapid ventricular response at the rate of 111 bpm, normal axis, IVCD, LVH.  Normal QT interval.  No evidence of ischemia. No significant change from  EKG 10/04/2017: Atrial fibrillation with controlled response at the rate of 66  bpm.  Recommendations:   Chrissie NoaWilliam "[name]" Theone MurdochVarker is a Caucasian male with CAD, severe MR noted in June 2018, hypertension, hyperlipidemia, chronic atrial fibrillation and chronic back pain due to spinal stenosis.   He is tolerating anticoagulation without any bleeding complications. He also has abdominal aortic aneurysm status post repair on 11/29/2014 by placement of stent graft by Dr. Durene CalWells Brabham. Surveillance of aneurysm repair duplex ordered today.   Afrter discussion with Dr. Cornelius Moraswen, it was felt we should attempt cardioversion. Schedule for Direct current cardioversion. I have discussed regarding risks benefits rate control vs rhythm control with the patient. Patient understands cardiac arrest and need for CPR, aspiration pneumonia, but not limited to these. Patient is willing.    Yates DecampJay Brianni Manthe, MD, [facility] AssociationFACC 01/17/2019, 1:54 PM [facility] Pager: [phone] Office: [phone] If no answer Cell [phone]

## 2019-01-17 NOTE — Progress Notes (Signed)
Primary Physician/Referring:  Ralene OkMoreira, [name], MD  Patient ID: Earl Martin, male    DOB: 05/01/1961, 58 y.o.   MRN: [id number]  CC" Atrial fibrillation for cardioversion  HPI: Earl Martin Bojarski  is a 58 y.o. male  with Earl Martin is a Caucasian male with severe mitral regurgitation, underwent CABG and MV Repair and Maze and LAA clipping  05/18/2018: SVG to RCA, Sorin Memo 4D annuloplasty 34 mm ring by Ashley Marinerub Owen, MD.  He has coronary artery disease with inferior MI in 2012 S/P mid RCA stenting and staged stenting to LAD in July 2012, patent by angiography on 04/2018, abdominal aortic aneurysm status post repair on 11/29/2014 by placement of stent graft by Dr. Durene CalWells Brabham.  Hypertension, hyperlipidemia, chronic atrial fibrillation and chronic back pain due to spinal stenosis. He is tolerating anticoagulation without any bleeding complications. He is a cigarette smoker unfortunately.  1/2 PPD. He remains asymptomatic and exercises daily. He has not used S/L NTG.   He is now scheduled for cardioversion. No new complaints.  Past Medical History:  Diagnosis Date  . Atrial fibrillation, persistent   . Complication of anesthesia    belligerant when awakes  . Coronary artery disease   . Dysrhythmia   . Headache   . Heart murmur   . Hypercholesterolemia   . Hypertension    essential  . Mitral regurgitation   . Myocardial infarction (HCC) 12/25/2010  . Pneumonia    hx  . S/P CABG x 1 05/18/2018   SVG to RPL, EVH via right thigh  . S/P Maze operation for atrial fibrillation 05/18/2018   Complete bilateral atrial lesion set using bipolar radiofrequency and cryothermy ablation with clipping of LA appendage  . S/P mitral valve repair 05/18/2018   34 mm Sorin Memo 4D ring annuloplasty  . Sleep apnea    does not use cpap  . Spinal stenosis     Past Surgical History:  Procedure Laterality Date  . ABDOMINAL AORTIC ENDOVASCULAR STENT GRAFT N/A 11/29/2014   Procedure: ABDOMINAL AORTIC  ENDOVASCULAR STENT GRAFT;  Surgeon: Nada LibmanVance W Brabham, MD;  Location: [facility] OR;  Service: Vascular;  Laterality: N/A;  . CARDIAC CATHETERIZATION    . CARDIOVERSION N/A 09/20/2012   Procedure: CARDIOVERSION;  Surgeon: Pamella PertJagadeesh R Waneda Klammer, MD;  [facility];  Service: Cardiovascular;  Laterality: N/A;  . CORONARY ARTERY BYPASS GRAFT N/A 05/18/2018   Procedure: CORONARY ARTERY BYPASS GRAFTING (CABG), ON PUMP, TIMES ONE, USING ENDOSCOPICALLY HARVESTED RIGHT GREATER SAPHENOUS VEIN;  Surgeon: Purcell Nailswen, Clarence H, MD;  [facility];  Service: Open Heart Surgery;  Laterality: N/A;  . CORONARY STENT PLACEMENT  12/25/2010   RCA  . CORONARY STENT PLACEMENT  12/26/2010   LAD  . HAND NERVE GRAFT    . INGUINAL HERNIA REPAIR Left 03/07/2013   Procedure: LEFT INGUINAL HERNIA REPAIR;  Surgeon: Emelia LoronMatthew Wakefield, MD;  [facility];  Service: General;  Laterality: Left;  . INSERTION OF MESH Left 03/07/2013   Procedure: INSERTION OF MESH;  Surgeon: Emelia LoronMatthew Wakefield, MD;  [facility];  Service: General;  Laterality: Left;  Marland Kitchen. MAZE N/A 05/18/2018   Procedure: MAZE;  Surgeon: Purcell Nailswen, Clarence H, MD;  [facility];  Service: Open Heart Surgery;  Laterality: N/A;  . MITRAL VALVE REPAIR N/A 05/18/2018   Procedure: MITRAL VALVE REPAIR (MVR);  Surgeon: Purcell Nailswen, Clarence H, MD;  [facility];  Service: Open Heart Surgery;  Laterality: N/A;  Using Memo 4D Semirigid Annuloplasty Ring Size 34  .  RIGHT/LEFT HEART CATH AND CORONARY ANGIOGRAPHY N/A 04/22/2018   Procedure: RIGHT/LEFT HEART CATH AND CORONARY ANGIOGRAPHY;  Surgeon: Yates DecampGanji, Lopez Dentinger, MD;  Location: [facility] INV[facility]IVE [facility] LAB;  Service: Cardiovascular;  Laterality: N/A;  . ROTATOR CUFF REPAIR    . TEE WITHOUT CARDIOVERSION N/A 02/04/2016   Procedure: TRANSESOPHAGEAL ECHOCARDIOGRAM (TEE);  Surgeon: Yates DecampJay Kellon Chalk, MD;  [facility];  Service: Cardiovascular;  Laterality: N/A;  . TEE WITHOUT CARDIOVERSION N/A 04/22/2018   Procedure: TRANSESOPHAGEAL ECHOCARDIOGRAM (TEE);   Surgeon: Yates DecampGanji, Megon Kalina, MD;  [facility];  Service: Cardiovascular;  Laterality: N/A;  . TEE WITHOUT CARDIOVERSION N/A 05/18/2018   Procedure: TRANSESOPHAGEAL ECHOCARDIOGRAM (TEE);  Surgeon: Purcell Nailswen, Clarence H, MD;  [facility];  Service: Open Heart Surgery;  Laterality: N/A;    Social History   Socioeconomic History  . Marital status: Divorced    Spouse name: Not on file  . Number of children: 2  . Years of education: Not on file  . Highest education level: Not on file  Occupational History  . Not on file  Social Needs  . Financial resource strain: Not on file  . Food insecurity    Worry: Not on file    Inability: Not on file  . Transportation needs    Medical: Not on file    Non-medical: Not on file  Tobacco Use  . Smoking status: Current Every Day Smoker    Packs/day: 0.50    Years: 30.00    Pack years: 15.00    Types: Cigarettes  . Smokeless tobacco: Never Used  Substance and Sexual Activity  . Alcohol use: Yes    Alcohol/week: 14.0 standard drinks    Types: 14 Cans of beer per week    Comment:  " 2-3 beers daily "  . Drug use: Yes  . Sexual activity: Not on file  Lifestyle  . Physical activity    Days per week: Not on file    Minutes per session: Not on file  . Stress: Not on file  Relationships  . Social Musicianconnections    Talks on phone: Not on file    Gets together: Not on file    Attends religious service: Not on file    Active member of club or organization: Not on file    Attends meetings of clubs or organizations: Not on file    Relationship status: Not on file  . Intimate partner violence    Fear of current or ex partner: Not on file    Emotionally abused: Not on file    Physically abused: Not on file    Forced sexual activity: Not on file  Other Topics Concern  . Not on file  Social History Narrative  . Not on file    Review of Systems  Constitution: Negative for chills, decreased appetite, malaise/fatigue and weight gain.   Cardiovascular: Positive for chest pain (sharp pain lasts few seconds) and palpitations (occasional). Negative for dyspnea on exertion, leg swelling and syncope.  Endocrine: Negative for cold intolerance.  Hematologic/Lymphatic: Does not bruise/bleed easily.  Musculoskeletal: Positive for back pain. Negative for joint swelling.  Gastrointestinal: Negative for abdominal pain, anorexia, change in bowel habit, hematochezia and melena.  Neurological: Negative for headaches and light-headedness.  Psychiatric/Behavioral: Negative for depression and substance abuse.  All other systems reviewed and are negative.     Objective  Blood pressure (!) 132/96, pulse 80, temperature 97.6 F (36.4 C), temperature source Oral, resp. rate 16, height 6' (1.829 m), weight 77.1 kg, SpO2  98 %. Body mass index is 23.06 kg/m.    Physical Exam  Constitutional: He appears well-developed and well-nourished. No distress.  HENT:  Head: Atraumatic.  Eyes: Conjunctivae are normal.  Neck: Neck supple. No JVD present. No thyromegaly present.  Cardiovascular: Intact distal pulses and normal pulses. An irregularly irregular rhythm present. Exam reveals no gallop, no S3 and no S4.  No murmur heard. S1 is variable, S2 is normal.   Pulmonary/Chest: Effort normal and breath sounds normal.  Abdominal: Soft. Bowel sounds are normal.  Musculoskeletal: Normal range of motion.        General: Deformity present. No edema.  Neurological: He is alert.  Skin: Skin is warm and dry.  Psychiatric: He has a normal mood and affect.   Radiology: No results found.  Laboratory examination:    CMP Latest Ref Rng & Units 01/10/2019 05/26/2018 05/24/2018  Glucose 65 - 99 mg/dL 89 114(H) 106(H)  BUN 6 - 24 mg/dL 8 8 10   Creatinine 0.76 - 1.27 mg/dL 0.69(L) 0.74 0.54(L)  Sodium 134 - 144 mmol/L 137 131(L) 131(L)  Potassium 3.5 - 5.2 mmol/L 4.8 4.5 4.2  Chloride 96 - 106 mmol/L 99 94(L) 93(L)  CO2 20 - 29 mmol/L 22 28 27   Calcium  8.7 - 10.2 mg/dL 9.3 8.8(L) 8.6(L)  Total Protein 6.5 - 8.1 g/dL - - -  Total Bilirubin 0.3 - 1.2 mg/dL - - -  Alkaline Phos 38 - 126 U/L - - -  AST 15 - 41 U/L - - -  ALT 0 - 44 U/L - - -   CBC Latest Ref Rng & Units 05/24/2018 05/23/2018 05/22/2018  WBC 4.0 - 10.5 K/uL 6.4 7.9 8.9  Hemoglobin 13.0 - 17.0 g/dL 10.7(L) 10.4(L) 12.3(L)  Hematocrit 39.0 - 52.0 % 32.7(L) 32.3(L) 37.6(L)  Platelets 150 - 400 K/uL 167 129(L) 116(L)   HEMOGLOBIN A1C Lab Results  Component Value Date   HGBA1C 5.4 05/16/2018   MPG 108.28 05/16/2018   TSH No results for input(s): TSH in the last 8760 hours.   Medications   There are no discontinued medications. Current Meds  Medication Sig  . acetaminophen (TYLENOL) 500 MG tablet Take 1,000 mg by mouth every 8 (eight) hours as needed for moderate pain.  . Ascorbic Acid (VITAMIN C) 1000 MG tablet Take 1,000 mg by mouth daily.  Marland Kitchen lisinopril (ZESTRIL) 10 MG tablet Take 1 tablet (10 mg total) by mouth daily.  Marland Kitchen lovastatin (MEVACOR) 20 MG tablet Take 1 tablet (20 mg total) by mouth at bedtime.  . metoprolol succinate (TOPROL-XL) 50 MG 24 hr tablet Take 1 tablet (50 mg total) by mouth daily. Take with or immediately following a meal.  . Multiple Vitamin (MULTIVITAMIN) capsule Take 1 capsule by mouth daily.  . nitroGLYCERIN (NITROSTAT) 0.4 MG SL tablet Place 1 tablet (0.4 mg total) under the tongue every 5 (five) minutes as needed for chest pain.  . rivaroxaban (XARELTO) 20 MG TABS tablet Take 1 tablet (20 mg total) by mouth daily with supper.    Cardiac Studies:   Echocardiogram 06/30/2018: Left ventricle cavity is normal in size. Moderate concentric hypertrophy of the left ventricle. Mild decrease in global wall motion. Visual EF is 45-50%. Calculated EF 50%. Left atrial cavity is severely dilated. S/p mitral valve repair with mild eccentric residual mitral regurgitation seen. No significant mitral stenosis. Mild tricuspid regurgitation.  No evidence of  pulmonary hypertension. Compared to previous study on 03/29/2018, mitral valve repair is new.  Coronary angiogram 04/22/2018:  Patent stents placed for acute inferior MI on 12/23/2010 S/P mid RCA non drug eluting stent implantation, staged proximal LAD DES stent 12/26/2010. PL branch of RCA chronically occluded with collaterals from LAD. Mid LAD distal to stent 50% stenosis. Mild pulmonary hypertension.  CABG and MV Repair and Maze and LAA clipping 05/18/2018: SVG to RCA, Sorin Memo 4D annuloplasty 34 mm ring by Cub Owen, MD  Abdominal aortic duplex 01/20/2018: No Abdominal aortic aneurysm noted. Maximum diameter of the abdominal aorta is 2.44 cm. AAA repair appears stable without endo-leak. Enlarged inferior vena cava, suggests elevated central venous pressure  Assessment   Coronary artery disease involving native coronary artery of native heart without angina pectoris - 2012: Mid RCA and LAD stenting. CABG 05/18/18: SVG to RCA, MV repair, Maze and LAA clipping - Plan: EKG 12-Lead, nitroGLYCERIN (NITROSTAT) 0.4 MG SL tablet  S/P mitral valve repair - Plan: amoxicillin (AMOXIL) 500 MG tablet  Permanent atrial fibrillation - CHA2DS2-VASc Score is 2.  Yearly risk of stroke: 2.3% - Plan: metoprolol succinate (TOPROL-XL) 50 MG 24 hr tablet, rivaroxaban (XARELTO) 20 MG TABS tablet  SBE (subacute bacterial endocarditis) prophylaxis candidate Plan: amoxicillin (AMOXIL) 500 MG tablet,   Essential hypertension - Plan: lisinopril (ZESTRIL) 10 MG tablet  S/P AAA repair using straight graft -  Endovascular stent graft repair on 11/29/2014 - Plan: PCV AORTA DUPLEX  Hypercholesteremia - Plan: lovastatin (MEVACOR) 20 MG tablet  EKG 12/05/2018: Atrial fibrillation with rapid ventricular response at the rate of 111 bpm, normal axis, IVCD, LVH.  Normal QT interval.  No evidence of ischemia. No significant change from  EKG 10/04/2017: Atrial fibrillation with controlled response at the rate of 66  bpm.  Recommendations:   Andres "[name]" Wille is a Caucasian male with CAD, severe MR noted in June 2018, hypertension, hyperlipidemia, chronic atrial fibrillation and chronic back pain due to spinal stenosis.   He is tolerating anticoagulation without any bleeding complications. He also has abdominal aortic aneurysm status post repair on 11/29/2014 by placement of stent graft by Dr. Wells Brabham. Surveillance of aneurysm repair duplex ordered today.   Afrter discussion with Dr. Owen, it was felt we should attempt cardioversion. Schedule for Direct current cardioversion. I have discussed regarding risks benefits rate control vs rhythm control with the patient. Patient understands cardiac arrest and need for CPR, aspiration pneumonia, but not limited to these. Patient is willing.    Graysin Luczynski, MD, FACC 01/17/2019, 1:54 PM [facility] [place] Pager: [phone] Office: [phone] If no answer Cell [phone] 

## 2019-01-17 NOTE — CV Procedure (Addendum)
Direct current cardioversion:  Indication symptomatic A. Fibrillation.  Procedure: Using 120 mg of IV Propofol and 80 mg IV Lidocaine (for reducing venous pain) for achieving deep sedation, synchronized direct current cardioversion performed. Patient was delivered with 120 Joules of electricity X 1 with success to NSR. Patient tolerated the procedure well. No immediate complication noted.   In view of piror chronic A. Fib S/P Maze procedure will use amiodarone for a short time.   Adrian Prows, MD, [facility] 01/17/2019, 2:09 PM [facility]. [place] Pager: [phone] Office: [phone] If no answer Cell [phone]

## 2019-01-18 ENCOUNTER — Encounter (HOSPITAL_COMMUNITY): Payer: Self-pay | Admitting: Cardiology

## 2019-01-18 NOTE — Anesthesia Postprocedure Evaluation (Signed)
Anesthesia Post Note  Patient: Earl Martin  Procedure(s) Performed: CARDIOVERSION (N/A )     Patient location during evaluation: Endoscopy Anesthesia Type: General Level of consciousness: awake and alert Pain management: pain level controlled Vital Signs Assessment: post-procedure vital signs reviewed and stable Respiratory status: spontaneous breathing, nonlabored ventilation, respiratory function stable and patient connected to nasal cannula oxygen Cardiovascular status: blood pressure returned to baseline and stable Postop Assessment: no apparent nausea or vomiting Anesthetic complications: no    Last Vitals:  Vitals:   01/17/19 1420 01/17/19 1430  BP: 106/67 115/90  Pulse: (!) 57 (!) 57  Resp: 14 16  Temp:    SpO2: 99% 100%    Last Pain:  Vitals:   01/17/19 1408  TempSrc: Temporal  PainSc: 0-No pain                 Catalina Gravel

## 2019-01-26 ENCOUNTER — Ambulatory Visit: Payer: BLUE CROSS/BLUE SHIELD | Admitting: Cardiology

## 2019-01-26 ENCOUNTER — Other Ambulatory Visit: Payer: BLUE CROSS/BLUE SHIELD

## 2019-01-26 ENCOUNTER — Encounter: Payer: Self-pay | Admitting: Cardiology

## 2019-01-26 ENCOUNTER — Other Ambulatory Visit: Payer: Self-pay

## 2019-01-26 VITALS — BP 119/78 | HR 71 | Temp 97.8°F | Ht 72.0 in | Wt 175.0 lb

## 2019-01-26 DIAGNOSIS — I2510 Atherosclerotic heart disease of native coronary artery without angina pectoris: Secondary | ICD-10-CM

## 2019-01-26 DIAGNOSIS — Z9889 Other specified postprocedural states: Secondary | ICD-10-CM

## 2019-01-26 DIAGNOSIS — Z951 Presence of aortocoronary bypass graft: Secondary | ICD-10-CM

## 2019-01-26 DIAGNOSIS — I4819 Other persistent atrial fibrillation: Secondary | ICD-10-CM | POA: Diagnosis not present

## 2019-01-26 NOTE — Progress Notes (Signed)
Primary Physician/Referring:  Jilda Panda, MD  Patient ID: Earl Martin, male    DOB: 1961-04-05, 58 y.o.   MRN: [id number]  Chief Complaint  Patient presents with  . Cardioversion    7-10 f/u  . Atrial Fibrillation    HPI: Earl Martin  is a 58 y.o. male  with Earl Martin is a Caucasian male with severe mitral regurgitation, underwent CABG and MV Repair and Maze and LAA clipping  05/18/2018: SVG to RCA, Sorin Memo 4D annuloplasty 34 mm ring by Lilly Cove, MD.  He has coronary artery disease with inferior MI in 2012 S/P mid RCA stenting and staged stenting to LAD in July 2012, patent by angiography on 04/2018, abdominal aortic aneurysm status post repair on 11/29/2014 by placement of stent graft by Dr. Annamarie Major.  Hypertension, hyperlipidemia, persistent atrial fibrillation and chronic back pain due to spinal stenosis. Surprisingly, he underwent cardioversion on 01/17/19, successful and now on short term Amiodarone. Presents for f/u.  He is doing well, essentially asymptomatic.  He is tolerating anticoagulation without any bleeding complications. He is a cigarette smoker unfortunately.  1/2 PPD. He remains asymptomatic and exercises daily. He has not used S/L NTG.   Past Medical History:  Diagnosis Date  . Atrial fibrillation, persistent   . Complication of anesthesia    belligerant when awakes  . Coronary artery disease   . Dysrhythmia   . Headache   . Heart murmur   . Hypercholesterolemia   . Hypertension    essential  . Mitral regurgitation   . Myocardial infarction ([place]) 12/25/2010  . Pneumonia    hx  . S/P CABG x 1 05/18/2018   SVG to RPL, EVH via right thigh  . S/P Maze operation for atrial fibrillation 05/18/2018   Complete bilateral atrial lesion set using bipolar radiofrequency and cryothermy ablation with clipping of LA appendage  . S/P mitral valve repair 05/18/2018   34 mm Sorin Memo 4D ring annuloplasty  . Sleep apnea    does not use cpap  .  Spinal stenosis     Past Surgical History:  Procedure Laterality Date  . ABDOMINAL AORTIC ENDOVASCULAR STENT GRAFT N/A 11/29/2014   Procedure: ABDOMINAL AORTIC ENDOVASCULAR STENT GRAFT;  Surgeon: Serafina Mitchell, MD;  Location: [place];  Service: Vascular;  Laterality: N/A;  . CARDIAC CATHETERIZATION    . CARDIOVERSION N/A 09/20/2012   Procedure: CARDIOVERSION;  Surgeon: Laverda Page, MD;  Location: [facility];  Service: Cardiovascular;  Laterality: N/A;  . CARDIOVERSION N/A 01/17/2019   Procedure: CARDIOVERSION;  Surgeon: Adrian Prows, MD;  Location: [place];  Service: Cardiovascular;  Laterality: N/A;  . CORONARY ARTERY BYPASS GRAFT N/A 05/18/2018   Procedure: CORONARY ARTERY BYPASS GRAFTING (CABG), ON PUMP, TIMES ONE, USING ENDOSCOPICALLY HARVESTED RIGHT GREATER SAPHENOUS VEIN;  Surgeon: Rexene Alberts, MD;  Location: [place];  Service: Open Heart Surgery;  Laterality: N/A;  . CORONARY STENT PLACEMENT  12/25/2010   RCA  . CORONARY STENT PLACEMENT  12/26/2010   LAD  . HAND NERVE GRAFT    . INGUINAL HERNIA REPAIR Left 03/07/2013   Procedure: LEFT INGUINAL HERNIA REPAIR;  Surgeon: Rolm Bookbinder, MD;  Location: [place];  Service: General;  Laterality: Left;  . INSERTION OF MESH Left 03/07/2013   Procedure: INSERTION OF MESH;  Surgeon: Rolm Bookbinder, MD;  Location: [place];  Service: General;  Laterality: Left;  Marland Kitchen MAZE N/A 05/18/2018   Procedure: MAZE;  Surgeon: Rexene Alberts, MD;  [facility];  Service: Open Heart Surgery;  Laterality: N/A;  . MITRAL VALVE REPAIR N/A 05/18/2018   Procedure: MITRAL VALVE REPAIR (MVR);  Surgeon: Purcell Nailswen, Clarence H, MD;  [facility];  Service: Open Heart Surgery;  Laterality: N/A;  Using Memo 4D Semirigid Annuloplasty Ring Size 34  . RIGHT/LEFT HEART CATH AND CORONARY ANGIOGRAPHY N/A 04/22/2018   Procedure: RIGHT/LEFT HEART CATH AND CORONARY ANGIOGRAPHY;  Surgeon: Yates DecampGanji, Michaila Kenney, MD;  Location: [facility];  Service: Cardiovascular;  Laterality:  N/A;  . ROTATOR CUFF REPAIR    . TEE WITHOUT CARDIOVERSION N/A 02/04/2016   Procedure: TRANSESOPHAGEAL ECHOCARDIOGRAM (TEE);  Surgeon: Yates DecampJay Wreatha Sturgeon, MD;  [facility];  Service: Cardiovascular;  Laterality: N/A;  . TEE WITHOUT CARDIOVERSION N/A 04/22/2018   Procedure: TRANSESOPHAGEAL ECHOCARDIOGRAM (TEE);  Surgeon: Yates DecampGanji, Rendon Howell, MD;  [facility];  Service: Cardiovascular;  Laterality: N/A;  . TEE WITHOUT CARDIOVERSION N/A 05/18/2018   Procedure: TRANSESOPHAGEAL ECHOCARDIOGRAM (TEE);  Surgeon: Purcell Nailswen, Clarence H, MD;  [facility];  Service: Open Heart Surgery;  Laterality: N/A;    Social History   Socioeconomic History  . Marital status: Divorced    Spouse name: Not on file  . Number of children: 2  . Years of education: Not on file  . Highest education level: Not on file  Occupational History  . Not on file  Social Needs  . Financial resource strain: Not on file  . Food insecurity    Worry: Not on file    Inability: Not on file  . Transportation needs    Medical: Not on file    Non-medical: Not on file  Tobacco Use  . Smoking status: Current Every Day Smoker    Packs/day: 0.50    Years: 30.00    Pack years: 15.00    Types: Cigarettes  . Smokeless tobacco: Never Used  Substance and Sexual Activity  . Alcohol use: Yes    Alcohol/week: 14.0 standard drinks    Types: 14 Cans of beer per week    Comment:  " 2-3 beers daily "  . Drug use: Yes  . Sexual activity: Not on file  Lifestyle  . Physical activity    Days per week: Not on file    Minutes per session: Not on file  . Stress: Not on file  Relationships  . Social Musicianconnections    Talks on phone: Not on file    Gets together: Not on file    Attends religious service: Not on file    Active member of club or organization: Not on file    Attends meetings of clubs or organizations: Not on file    Relationship status: Not on file  . Intimate partner violence    Fear of current or ex partner: Not on file     Emotionally abused: Not on file    Physically abused: Not on file    Forced sexual activity: Not on file  Other Topics Concern  . Not on file  Social History Narrative  . Not on file    Review of Systems  Constitution: Negative for chills, decreased appetite, malaise/fatigue and weight gain.  Cardiovascular: Positive for chest pain (sharp pain lasts few seconds) and palpitations (occasional). Negative for dyspnea on exertion, leg swelling and syncope.  Endocrine: Negative for cold intolerance.  Hematologic/Lymphatic: Does not bruise/bleed easily.  Musculoskeletal: Positive for back pain. Negative for joint swelling.  Gastrointestinal: Negative for abdominal pain, anorexia, change in bowel habit, hematochezia and melena.  Neurological: Negative for headaches and light-headedness.  Psychiatric/Behavioral: Negative for depression and substance abuse.  All other systems reviewed and are negative.  Objective  Blood pressure 119/78, pulse 71, temperature 97.8 F (36.6 C), height 6' (1.829 m), weight 175 lb (79.4 kg), SpO2 99 %. Body mass index is 23.73 kg/m.    Physical Exam  Constitutional: He appears well-developed and well-nourished. No distress.  HENT:  Head: Atraumatic.  Eyes: Conjunctivae are normal.  Neck: Neck supple. No JVD present. No thyromegaly present.  Cardiovascular: S1 normal, normal heart sounds, intact distal pulses and normal pulses. A regularly irregular rhythm present. Exam reveals no gallop, no S3 and no S4.  No murmur heard.    Pulmonary/Chest: Effort normal and breath sounds normal.  Abdominal: Soft. Bowel sounds are normal.  Musculoskeletal: Normal range of motion.        General: Deformity present. No edema.  Neurological: He is alert.  Skin: Skin is warm and dry.  Psychiatric: He has a normal mood and affect.   Radiology: No results found.  Laboratory examination:    CMP Latest Ref Rng & Units 01/10/2019 05/26/2018 05/24/2018  Glucose 65 - 99  mg/dL 89 161(W114(H) 960(A106(H)  BUN 6 - 24 mg/dL 8 8 10   Creatinine 0.76 - 1.27 mg/dL 5.40(J0.69(L) 8.110.74 9.14(N0.54(L)  Sodium 134 - 144 mmol/L 137 131(L) 131(L)  Potassium 3.5 - 5.2 mmol/L 4.8 4.5 4.2  Chloride 96 - 106 mmol/L 99 94(L) 93(L)  CO2 20 - 29 mmol/L 22 28 27   Calcium 8.7 - 10.2 mg/dL 9.3 8.2(N8.8(L) 5.6(O8.6(L)  Total Protein 6.5 - 8.1 g/dL - - -  Total Bilirubin 0.3 - 1.2 mg/dL - - -  Alkaline Phos 38 - 126 U/L - - -  AST 15 - 41 U/L - - -  ALT 0 - 44 U/L - - -   CBC Latest Ref Rng & Units 05/24/2018 05/23/2018 05/22/2018  WBC 4.0 - 10.5 K/uL 6.4 7.9 8.9  Hemoglobin 13.0 - 17.0 g/dL 10.7(L) 10.4(L) 12.3(L)  Hematocrit 39.0 - 52.0 % 32.7(L) 32.3(L) 37.6(L)  Platelets 150 - 400 K/uL 167 129(L) 116(L)   HEMOGLOBIN A1C Lab Results  Component Value Date   HGBA1C 5.4 05/16/2018   MPG 108.28 05/16/2018   TSH No results for input(s): TSH in the last 8760 hours.   Medications   There are no discontinued medications. Current Meds  Medication Sig  . acetaminophen (TYLENOL) 500 MG tablet Take 1,000 mg by mouth every 8 (eight) hours as needed for moderate pain.  Marland Kitchen. amiodarone (PACERONE) 200 MG tablet Take 1 tablet (200 mg total) by mouth daily. Take 2 tablets twice daily for  Two days.  . Ascorbic Acid (VITAMIN C) 1000 MG tablet Take 1,000 mg by mouth daily.  Marland Kitchen. lisinopril (ZESTRIL) 10 MG tablet Take 1 tablet (10 mg total) by mouth daily.  Marland Kitchen. lovastatin (MEVACOR) 20 MG tablet Take 1 tablet (20 mg total) by mouth at bedtime.  . metoprolol succinate (TOPROL-XL) 50 MG 24 hr tablet Take 1 tablet (50 mg total) by mouth daily. Take with or immediately following a meal.  . Multiple Vitamin (MULTIVITAMIN) capsule Take 1 capsule by mouth daily.  . nitroGLYCERIN (NITROSTAT) 0.4 MG SL tablet Place 1 tablet (0.4 mg total) under the tongue every 5 (five) minutes as needed for chest pain.  . rivaroxaban (XARELTO) 20 MG TABS tablet Take 1 tablet (20 mg total) by mouth daily with supper.    Cardiac Studies:    Echocardiogram 06/30/2018: Left ventricle  cavity is normal in size. Moderate concentric hypertrophy of the left ventricle. Mild decrease in global wall motion. Visual EF is 45-50%. Calculated EF 50%. Left atrial cavity is severely dilated. S/p mitral valve repair with mild eccentric residual mitral regurgitation seen. No significant mitral stenosis. Mild tricuspid regurgitation.  No evidence of pulmonary hypertension. Compared to previous study on 03/29/2018, mitral valve repair is new.  Coronary angiogram 04/22/2018: Patent stents placed for acute inferior MI on 12/23/2010 S/P mid RCA non drug eluting stent implantation, staged proximal LAD DES stent 12/26/2010. PL branch of RCA chronically occluded with collaterals from LAD. Mid LAD distal to stent 50% stenosis. Mild pulmonary hypertension.  CABG and MV Repair and Maze and LAA clipping 05/18/2018: SVG to RCA, Sorin Memo 4D annuloplasty 34 mm ring by Ashley Marinerub Owen, MD  Abdominal aortic duplex 01/20/2018: No Abdominal aortic aneurysm noted. Maximum diameter of the abdominal aorta is 2.44 cm. AAA repair appears stable without endo-leak. Enlarged inferior vena cava, suggests elevated central venous pressure  Direct current cardioversion: 01/17/2019 Procedure: Using 120 mg of IV Propofol and 80 mg IV Lidocaine (for reducing venous pain) for achieving deep sedation, synchronized direct current cardioversion performed. Patient was delivered with 120 Joules of electricity X 1 with success to NSR. Patient tolerated the procedure well. No immediate complication noted.   In view of piror chronic A. Fib S/P Maze procedure will use amiodarone for a short time.   Assessment     ICD-10-CM   1. Atrial fibrillation, persistent  I48.19 EKG 12-Lead   CHA2DS2-VASc Score is 2.  Yearly risk of stroke: 2.3%   2. S/P mitral valve repair  Z98.890   3. Coronary artery disease involving native coronary artery of native heart without angina pectoris  I25.10   4. Hx of  CABG  Z95.1    2012: Mid RCA and LAD stenting. CABG 05/18/18: SVG to RCA, MV repair, Maze and LAA clipping     EKG 12/05/2018: Atrial fibrillation with rapid ventricular response at the rate of 111 bpm, normal axis, IVCD, LVH.  Normal QT interval.  No evidence of ischemia. No significant change from  EKG 10/04/2017: Atrial fibrillation with controlled response at the rate of 66 bpm.  Recommendations:   Chrissie NoaWilliam "[name]" Theone MurdochVarker is a Caucasian male with CAD, severe MR noted in June 2018, hypertension, hyperlipidemia, chronic atrial fibrillation and chronic back pain due to spinal stenosis. He also has abdominal aortic aneurysm status post repair on 11/29/2014 by placement of stent graft by Dr. Durene CalWells Brabham.  H/O mitral valve repair and single-vessel coronary bypass surgery,  maze procedure and left atrial appendage clipping performed on 05/18/2018.   He did undergo successful direct current cardioversion on 01/17/2019 and is presently on amiodarone. He is maintaining sinus rhythm.  Advised him to continue the same for now.  I'd like to see him back in 6 months.  He is tolerating anticoagulation without any bleeding complications. Blood pressure is well controlled, no clinical evidence of heart failure. He is aware that he needs endocarditis prophylaxis. He has an appointment to see Dr. Durene CalWells Brabham for management and evaluation of his abdominal aortic aneurysm repair, I have canceled abdominal aortic duplex in our office.   Yates DecampJay Marzell Isakson, MD, [facility]ACC 01/26/2019, 1:41 PM  [facility] Pager: [phone] Office: [phone] If no answer Cell [phone]

## 2019-02-06 ENCOUNTER — Ambulatory Visit: Payer: BLUE CROSS/BLUE SHIELD | Admitting: Surgery

## 2019-02-06 ENCOUNTER — Ambulatory Visit (HOSPITAL_COMMUNITY): Payer: BLUE CROSS/BLUE SHIELD

## 2019-02-13 ENCOUNTER — Other Ambulatory Visit: Payer: BLUE CROSS/BLUE SHIELD

## 2019-02-23 ENCOUNTER — Other Ambulatory Visit: Payer: BLUE CROSS/BLUE SHIELD

## 2019-03-02 ENCOUNTER — Other Ambulatory Visit: Payer: Self-pay

## 2019-03-02 DIAGNOSIS — I714 Abdominal aortic aneurysm, without rupture, unspecified: Secondary | ICD-10-CM

## 2019-03-03 ENCOUNTER — Telehealth (HOSPITAL_COMMUNITY): Payer: Self-pay

## 2019-03-03 NOTE — Telephone Encounter (Signed)
Voicemail was left for patient including appointment time and information and instructions regarding COVID-19 safety procedures including mask usage, rescheduling if sympotmatic, and limited visitors in testing area.  

## 2019-03-04 ENCOUNTER — Other Ambulatory Visit: Payer: Self-pay | Admitting: Cardiology

## 2019-03-06 ENCOUNTER — Other Ambulatory Visit: Payer: Self-pay

## 2019-03-06 ENCOUNTER — Ambulatory Visit: Payer: BLUE CROSS/BLUE SHIELD | Admitting: Surgery

## 2019-03-06 ENCOUNTER — Encounter: Payer: Self-pay | Admitting: Surgery

## 2019-03-06 ENCOUNTER — Ambulatory Visit (HOSPITAL_COMMUNITY)
Admission: RE | Admit: 2019-03-06 | Discharge: 2019-03-06 | Disposition: A | Discharge status: Home / Self Care | Payer: BLUE CROSS/BLUE SHIELD | Source: Ambulatory Visit | Attending: Surgery | Admitting: Surgery

## 2019-03-06 ENCOUNTER — Ambulatory Visit (HOSPITAL_COMMUNITY): Payer: BLUE CROSS/BLUE SHIELD

## 2019-03-06 VITALS — BP 117/72 | HR 57 | Temp 97.3°F | Resp 18 | Ht 72.0 in | Wt 176.0 lb

## 2019-03-06 DIAGNOSIS — I714 Abdominal aortic aneurysm, without rupture, unspecified: Secondary | ICD-10-CM

## 2019-03-06 NOTE — Telephone Encounter (Signed)
Yes, endocarditis prophylaxis

## 2019-03-06 NOTE — Progress Notes (Signed)
[facility] [place]  Patient name: Earl Martin MRN: [id number] DOB: 08/16/1960 Sex: male   REASON FOR VISIT:    Follow up  HISOTRY OF PRESENT ILLNESS:   Scherrie NovemberWilliam E Varkeris a 58 y.o.malewho is status post endovascular repair of a 6.3 cm infrarenal abdominal aortic aneurysm on 11/29/2014. At his initial postoperative visit, he had a type II endoleak, however at his 6 month follow-up, this had resolved and the aneurysm sac had decreased down to 6.0 cm  Lower extremity and carotid Doppler studies in June 2016 were unremarkable. He is on Xarelto for atrial fibrillation. He is medically managed for hypertension. [place]  He denies any abdominal pain or symptoms of claudication.  He is walking every day without issues.  He states this is the best he has felt in quite some time.  On 05-18-2018, he underwent mitral valve repair, MAZE procedure, and CABG  PAST MEDICAL HISTORY:   Past Medical History:  Diagnosis Date  . Atrial fibrillation, persistent   . Complication of anesthesia    belligerant when awakes  . Coronary artery disease   . Dysrhythmia   . Headache   . Heart murmur   . Hypercholesterolemia   . Hypertension    essential  . Mitral regurgitation   . Myocardial infarction ([facility]) 12/25/2010  . Pneumonia    hx  . S/P CABG x 1 05/18/2018   SVG to RPL, EVH via right thigh  . S/P Maze operation for atrial fibrillation 05/18/2018   Complete bilateral atrial lesion set using bipolar radiofrequency and cryothermy ablation with clipping of LA appendage  . S/P mitral valve repair 05/18/2018   34 mm Sorin Memo 4D ring annuloplasty  . Sleep apnea    does not use cpap  . Spinal stenosis      FAMILY HISTORY:   Family History  Problem Relation Age of Onset  . Heart failure Father   . Cancer Father   . Cancer Sister   . Diabetes Sister     SOCIAL HISTORY:   Social History    Tobacco Use  . Smoking status: Current Every Day Smoker    Packs/day: 0.50    Years: 30.00    Pack years: 15.00    Types: Cigarettes  . Smokeless tobacco: Never Used  Substance Use Topics  . Alcohol use: Yes    Alcohol/week: 14.0 standard drinks    Types: 14 Cans of beer per week    Comment:  " 2-3 beers daily "     ALLERGIES:   No Known Allergies   CURRENT MEDICATIONS:   Current Outpatient Medications  Medication Sig Dispense Refill  . acetaminophen (TYLENOL) 500 MG tablet Take 1,000 mg by mouth every 8 (eight) hours as needed for moderate pain.    Marland Kitchen. amiodarone (PACERONE) 200 MG tablet Take 1 tablet (200 mg total) by mouth daily. Take 2 tablets twice daily for  Two days. 90 tablet 1  . Ascorbic Acid (VITAMIN C) 1000 MG tablet Take 1,000 mg by mouth daily.    Marland Kitchen. lisinopril (ZESTRIL) 10 MG tablet Take 1 tablet (10 mg total) by mouth daily. 90 tablet 0  . lovastatin (MEVACOR) 20 MG tablet Take 1 tablet (20 mg total) by mouth at bedtime. 90 tablet 3  . metoprolol succinate (TOPROL-XL) 50 MG 24 hr tablet Take 1 tablet (50 mg total) by mouth daily. Take with or immediately following a meal. 90 tablet 3  . Multiple Vitamin (  MULTIVITAMIN) capsule Take 1 capsule by mouth daily.    . nitroGLYCERIN (NITROSTAT) 0.4 MG SL tablet Place 1 tablet (0.4 mg total) under the tongue every 5 (five) minutes as needed for chest pain. 25 tablet 2  . rivaroxaban (XARELTO) 20 MG TABS tablet Take 1 tablet (20 mg total) by mouth daily with supper. 90 tablet 3   No current facility-administered medications for this visit.     REVIEW OF SYSTEMS:   [X]  denotes positive finding, [ ]  denotes negative finding Cardiac  Comments:  Chest pain or chest pressure:    Shortness of breath upon exertion:    Short of breath when lying flat:    Irregular heart rhythm:        Vascular    Pain in calf, thigh, or hip brought on by ambulation:    Pain in feet at night that wakes you up from your sleep:     Blood  clot in your veins:    Leg swelling:         Pulmonary    Oxygen at home:    Productive cough:     Wheezing:         Neurologic    Sudden weakness in arms or legs:     Sudden numbness in arms or legs:     Sudden onset of difficulty speaking or slurred speech:    Temporary loss of vision in one eye:     Problems with dizziness:         Gastrointestinal    Blood in stool:     Vomited blood:         Genitourinary    Burning when urinating:     Blood in urine:        Psychiatric    Major depression:         Hematologic    Bleeding problems:    Problems with blood clotting too easily:        Skin    Rashes or ulcers:        Constitutional    Fever or chills:      PHYSICAL EXAM:   There were no vitals filed for this visit.  GENERAL: The patient is a well-nourished male, in no acute distress. The vital signs are documented above. CARDIAC: There is a regular rate and rhythm.  VASCULAR: I could not palpate pedal pulses today PULMONARY: Non-labored respirations ABDOMEN: Soft and non-tender  MUSCULOSKELETAL: There are no major deformities or cyanosis. NEUROLOGIC: No focal weakness or paresthesias are detected. SKIN: There are no ulcers or rashes noted. PSYCHIATRIC: The patient has a normal affect.  STUDIES:   I have ordered and reviewed the following: AAA:  Max diameter is 4.38 cm Right CIA: 2.57 Left CIA:  1.84    Pre-CABG dopplers(05-16-2018): 1-39% bilateral carotid disease  MEDICAL ISSUES:   AAA: His aneurysm sac continues to shrink.  Maximum diameter is 4.3 cm.  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  I will have him back in 1 year for repeat ultrasound.    Leia Alf, MD, FACS [facility] [phone] Pager [phone]

## 2019-03-06 NOTE — Telephone Encounter (Signed)
Do we fill this?

## 2019-03-28 ENCOUNTER — Other Ambulatory Visit: Payer: Self-pay | Admitting: Thoracic Surgery (Cardiothoracic Vascular Surgery)

## 2019-03-30 ENCOUNTER — Other Ambulatory Visit: Payer: Self-pay | Admitting: Cardiology

## 2019-03-30 DIAGNOSIS — Z9889 Other specified postprocedural states: Secondary | ICD-10-CM

## 2019-04-08 ENCOUNTER — Other Ambulatory Visit: Payer: Self-pay | Admitting: Cardiology

## 2019-04-19 ENCOUNTER — Telehealth: Payer: Self-pay

## 2019-04-19 NOTE — Telephone Encounter (Signed)
Spoke to Micro, No close contact, but okay to visit

## 2019-05-29 ENCOUNTER — Other Ambulatory Visit: Payer: Self-pay | Admitting: Cardiology

## 2019-06-07 ENCOUNTER — Other Ambulatory Visit: Payer: Self-pay | Admitting: Cardiology

## 2019-06-07 ENCOUNTER — Ambulatory Visit: Payer: BLUE CROSS/BLUE SHIELD | Admitting: Cardiology

## 2019-06-12 NOTE — Telephone Encounter (Signed)
Please fill if appropriate.  

## 2019-06-26 ENCOUNTER — Encounter: Payer: Self-pay | Admitting: Thoracic Surgery (Cardiothoracic Vascular Surgery)

## 2019-06-26 ENCOUNTER — Encounter: Payer: Self-pay | Admitting: Cardiology

## 2019-06-26 ENCOUNTER — Other Ambulatory Visit: Payer: Self-pay

## 2019-06-26 ENCOUNTER — Ambulatory Visit (INDEPENDENT_AMBULATORY_CARE_PROVIDER_SITE_OTHER): Payer: BLUE CROSS/BLUE SHIELD | Admitting: Cardiology

## 2019-06-26 ENCOUNTER — Ambulatory Visit: Payer: BLUE CROSS/BLUE SHIELD | Admitting: Thoracic Surgery (Cardiothoracic Vascular Surgery)

## 2019-06-26 VITALS — BP 130/77 | HR 77 | Temp 97.5°F | Resp 20 | Ht 72.0 in | Wt 179.0 lb

## 2019-06-26 VITALS — BP 119/72 | HR 64 | Temp 97.6°F | Resp 16 | Ht 72.0 in | Wt 179.3 lb

## 2019-06-26 DIAGNOSIS — Z9889 Other specified postprocedural states: Secondary | ICD-10-CM

## 2019-06-26 DIAGNOSIS — I724 Aneurysm of artery of lower extremity: Secondary | ICD-10-CM | POA: Diagnosis not present

## 2019-06-26 DIAGNOSIS — I2510 Atherosclerotic heart disease of native coronary artery without angina pectoris: Secondary | ICD-10-CM | POA: Diagnosis not present

## 2019-06-26 DIAGNOSIS — I480 Paroxysmal atrial fibrillation: Secondary | ICD-10-CM

## 2019-06-26 DIAGNOSIS — Z8679 Personal history of other diseases of the circulatory system: Secondary | ICD-10-CM

## 2019-06-26 DIAGNOSIS — Z951 Presence of aortocoronary bypass graft: Secondary | ICD-10-CM

## 2019-06-26 MED ORDER — AMIODARONE HCL 200 MG PO TABS: 100.0000 mg | ORAL_TABLET | Freq: Every day | ORAL | 3 refills | Status: DC

## 2019-06-26 NOTE — Patient Instructions (Signed)
Continue all previous medications without any changes at this time  Stop smoking immediately and permanently.  Endocarditis is a potentially serious infection of heart valves or inside lining of the heart.  It occurs more commonly in patients with diseased heart valves (such as patient's with aortic or mitral valve disease) and in patients who have undergone heart valve repair or replacement.  Certain surgical and dental procedures may put you at risk, such as dental cleaning, other dental procedures, or any surgery involving the respiratory, urinary, gastrointestinal tract, gallbladder or prostate gland.   To minimize your chances for develooping endocarditis, maintain good oral health and seek prompt medical attention for any infections involving the mouth, teeth, gums, skin or urinary tract.    Always notify your doctor or dentist about your underlying heart valve condition before having any invasive procedures. You will need to take antibiotics before certain procedures, including all routine dental cleanings or other dental procedures.  Your cardiologist or dentist should prescribe these antibiotics for you to be taken ahead of time.      

## 2019-06-26 NOTE — Progress Notes (Addendum)
[address]       [place] [address]             [phone]     CARDIOTHORACIC SURGERY OFFICE NOTE  Referring Provider is Adrian Prows, MD PCP is Jilda Panda, MD   HPI:  Patient is a 59 year old male with history of coronary artery disease status post inferolateral wall myocardial infarction in 2012 status post multivessel PCI and stenting at that time, mitral regurgitation, hypertension, long-standing persistent atrial fibrillation, obstructive sleep apnea, abdominal aortic aneurysm status post endovascular repair in 2016, and long-standing tobacco abuse whoreturns to the office today for routine follow-up more than 1 year status post mitral valve repair, coronary artery bypass grafting x1, and Maze procedure on May 18, 2018.   Routine follow-up echocardiogram performed January 2020 revealed low normal left ventricular systolic function with ejection fraction estimated 45 to 50%.  Mitral valve repair appeared intact with mild residual mitral regurgitation.  Patient was last seen here in our office on December 05, 2018 at which time he was doing well although he was back in atrial fibrillation.  Since then he underwent cardioversion by Dr. Einar Gip on January 17, 2019 and he has reportedly been maintaining sinus rhythm ever since.  Patient returns to our office today and reports that he is feeling exceptionally well.  He exercises on nearly a daily basis and he reports no physical limitations.  He states that he only gets short of breath if he really pushes himself physically.  He does not get any chest pain or chest tightness.  He has not had significant palpitations.  He is still on amiodarone and he remains anticoagulated using Xarelto.  He is very pleased with his outcome.  Unfortunately he is still smoking cigarettes.  Current Outpatient Medications  Medication Sig Dispense Refill  . acetaminophen (TYLENOL) 500 MG tablet Take 1,000 mg by mouth every 8 (eight) hours as  needed for moderate pain.    Marland Kitchen amiodarone (PACERONE) 200 MG tablet TAKE 1 TABLET ONCE DAILY. 90 tablet 0  . amoxicillin (AMOXIL) 500 MG capsule TAKE 4 CAPSULES AS 1 DOSE. 8 capsule 0  . Ascorbic Acid (VITAMIN C) 1000 MG tablet Take 1,000 mg by mouth daily.    Marland Kitchen lisinopril (ZESTRIL) 10 MG tablet TAKE 1 TABLET ONCE DAILY. 90 tablet 0  . lovastatin (MEVACOR) 20 MG tablet Take 1 tablet (20 mg total) by mouth at bedtime. 90 tablet 3  . metoprolol succinate (TOPROL-XL) 50 MG 24 hr tablet Take 1 tablet (50 mg total) by mouth daily. Take with or immediately following a meal. 90 tablet 3  . Multiple Vitamin (MULTIVITAMIN) capsule Take 1 capsule by mouth daily.    . nitroGLYCERIN (NITROSTAT) 0.4 MG SL tablet Place 1 tablet (0.4 mg total) under the tongue every 5 (five) minutes as needed for chest pain. 25 tablet 2  . rivaroxaban (XARELTO) 20 MG TABS tablet Take 1 tablet (20 mg total) by mouth daily with supper. 90 tablet 3   No current facility-administered medications for this visit.      Physical Exam:   BP 130/77   Pulse 77   Temp (!) 97.5 F (36.4 C) (Skin)   Resp 20   Ht 6' (1.829 m)   Wt 179 lb (81.2 kg)   SpO2 97% Comment: RA  BMI 24.28 kg/m   General:  Well-appearing  Chest:   Clear to auscultation  CV:   Regular rate and rhythm with soft systolic  murmur heard at the apex  Incisions:  Completely healed  Abdomen:  Soft nontender  Extremities:  Warm and well-perfused  Diagnostic Tests:  2 channel telemetry rhythm strip demonstrates normal sinus rhythm   Impression:  Patient is doing well and maintaining sinus rhythm more than 1 year status post minimally invasive mitral valve repair and Maze procedure.  Plan:  We have not recommended any change the patient's current medications.  I have strongly encouraged the patient to find a way to quit smoking permanently.  The patient has been reminded regarding the importance of dental hygiene and the lifelong need for antibiotic  prophylaxis for all dental cleanings and other related invasive procedures.  The patient will continue to follow-up regularly with Dr. Jacinto Halim and return to our office in the future only should specific problems or questions arise.  I spent in excess of 15 minutes during the conduct of this office consultation and >50% of this time involved direct face-to-face encounter with the patient for counseling and/or coordination of their care.    Salvatore Decent. Cornelius Moras, MD 06/26/2019 10:40 AM

## 2019-06-26 NOTE — Progress Notes (Signed)
Primary Physician/Referring:  Jilda Panda, MD  Patient ID: Earl Martin, male    DOB: 01/04/1961, 59 y.o.   MRN: [id number]  Chief Complaint  Patient presents with  . Coronary Artery Disease  . Atrial Fibrillation    HPI: Earl Martin  is a 59 y.o. male "Earl" Martin with CAD, severe MR noted in June 2018, hypertension, hyperlipidemia, atrial fibrillation and chronic back pain due to spinal stenosis. He also has abdominal aortic aneurysm status post repair on 11/29/2014 by placement of stent graft by Dr. Annamarie Major.  H/O mitral valve repair and single-vessel coronary bypass surgery,  maze procedure and left atrial appendage clipping performed on 05/18/2018.   He is maintaining sinus rhythm since cardioversion on 01/17/2019.  He is tolerating anticoagulation without any bleeding complications. He is a cigarette smoker unfortunately.  1/2 PPD. He remains asymptomatic and exercises daily. He has not used S/L NTG.   Past Medical History:  Diagnosis Date  . Atrial fibrillation, persistent ([facility])   . Complication of anesthesia    belligerant when awakes  . Coronary artery disease   . Dysrhythmia   . Headache   . Heart murmur   . Hypercholesterolemia   . Hypertension    essential  . Mitral regurgitation   . Myocardial infarction ([facility]) 12/25/2010  . Pneumonia    hx  . S/P CABG x 1 05/18/2018   SVG to RPL, EVH via right thigh  . S/P Maze operation for atrial fibrillation 05/18/2018   Complete bilateral atrial lesion set using bipolar radiofrequency and cryothermy ablation with clipping of LA appendage  . S/P mitral valve repair 05/18/2018   34 mm Sorin Memo 4D ring annuloplasty  . Sleep apnea    does not use cpap  . Spinal stenosis     Past Surgical History:  Procedure Laterality Date  . ABDOMINAL AORTIC ENDOVASCULAR STENT GRAFT N/A 11/29/2014   Procedure: ABDOMINAL AORTIC ENDOVASCULAR STENT GRAFT;  Surgeon: Serafina Mitchell, MD;  Location: [facility];  Service: Vascular;   Laterality: N/A;  . CARDIAC CATHETERIZATION    . CARDIOVERSION N/A 09/20/2012   Procedure: CARDIOVERSION;  Surgeon: Laverda Page, MD;  Location: [facility];  Service: Cardiovascular;  Laterality: N/A;  . CARDIOVERSION N/A 01/17/2019   Procedure: CARDIOVERSION;  Surgeon: Adrian Prows, MD;  Location: [facility];  Service: Cardiovascular;  Laterality: N/A;  . CORONARY ARTERY BYPASS GRAFT N/A 05/18/2018   Procedure: CORONARY ARTERY BYPASS GRAFTING (CABG), ON PUMP, TIMES ONE, USING ENDOSCOPICALLY HARVESTED RIGHT GREATER SAPHENOUS VEIN;  Surgeon: Rexene Alberts, MD;  Location: [facility];  Service: Open Heart Surgery;  Laterality: N/A;  . CORONARY STENT PLACEMENT  12/25/2010   RCA  . CORONARY STENT PLACEMENT  12/26/2010   LAD  . HAND NERVE GRAFT    . INGUINAL HERNIA REPAIR Left 03/07/2013   Procedure: LEFT INGUINAL HERNIA REPAIR;  Surgeon: Rolm Bookbinder, MD;  Location: [facility];  Service: General;  Laterality: Left;  . INSERTION OF MESH Left 03/07/2013   Procedure: INSERTION OF MESH;  Surgeon: Rolm Bookbinder, MD;  Location: [facility];  Service: General;  Laterality: Left;  Marland Kitchen MAZE N/A 05/18/2018   Procedure: MAZE;  Surgeon: Rexene Alberts, MD;  Location: [facility];  Service: Open Heart Surgery;  Laterality: N/A;  . MITRAL VALVE REPAIR N/A 05/18/2018   Procedure: MITRAL VALVE REPAIR (MVR);  Surgeon: Rexene Alberts, MD;  Location: [facility];  Service: Open Heart Surgery;  Laterality: N/A;  Using Memo 4D Semirigid Annuloplasty  Ring Size 34  . RIGHT/LEFT HEART CATH AND CORONARY ANGIOGRAPHY N/A 04/22/2018   Procedure: RIGHT/LEFT HEART CATH AND CORONARY ANGIOGRAPHY;  Surgeon: Adrian Prows, MD;  Location: [facility];  Service: Cardiovascular;  Laterality: N/A;  . ROTATOR CUFF REPAIR    . TEE WITHOUT CARDIOVERSION N/A 02/04/2016   Procedure: TRANSESOPHAGEAL ECHOCARDIOGRAM (TEE);  Surgeon: Adrian Prows, MD;  Location: [facility];  Service: Cardiovascular;  Laterality: N/A;  . TEE WITHOUT CARDIOVERSION N/A  04/22/2018   Procedure: TRANSESOPHAGEAL ECHOCARDIOGRAM (TEE);  Surgeon: Adrian Prows, MD;  Location: [facility];  Service: Cardiovascular;  Laterality: N/A;  . TEE WITHOUT CARDIOVERSION N/A 05/18/2018   Procedure: TRANSESOPHAGEAL ECHOCARDIOGRAM (TEE);  Surgeon: Rexene Alberts, MD;  Location: [facility];  Service: Open Heart Surgery;  Laterality: N/A;    Social History   Socioeconomic History  . Marital status: Divorced    Spouse name: Not on file  . Number of children: 2  . Years of education: Not on file  . Highest education level: Not on file  Occupational History  . Not on file  Tobacco Use  . Smoking status: Current Every Day Smoker    Packs/day: 0.50    Years: 30.00    Pack years: 15.00    Types: Cigarettes  . Smokeless tobacco: Never Used  Substance and Sexual Activity  . Alcohol use: Yes    Alcohol/week: 14.0 standard drinks    Types: 14 Cans of beer per week    Comment:  " 2-3 beers daily "  . Drug use: Never  . Sexual activity: Not on file  Other Topics Concern  . Not on file  Social History Narrative  . Not on file   Social Determinants of Health   Financial Resource Strain:   . Difficulty of Paying Living Expenses: Not on file  Food Insecurity:   . Worried About Charity fundraiser in the Last Year: Not on file  . Ran Out of Food in the Last Year: Not on file  Transportation Needs:   . Lack of Transportation (Medical): Not on file  . Lack of Transportation (Non-Medical): Not on file  Physical Activity:   . Days of Exercise per Week: Not on file  . Minutes of Exercise per Session: Not on file  Stress:   . Feeling of Stress : Not on file  Social Connections:   . Frequency of Communication with Friends and Family: Not on file  . Frequency of Social Gatherings with Friends and Family: Not on file  . Attends Religious Services: Not on file  . Active Member of Clubs or Organizations: Not on file  . Attends Archivist Meetings: Not on file  . Marital  Status: Not on file  Intimate Partner Violence:   . Fear of Current or Ex-Partner: Not on file  . Emotionally Abused: Not on file  . Physically Abused: Not on file  . Sexually Abused: Not on file    Review of Systems  Constitution: Negative for chills, decreased appetite, malaise/fatigue and weight gain.  Cardiovascular: Positive for chest pain (sharp pain lasts few seconds) and palpitations (occasional). Negative for dyspnea on exertion, leg swelling and syncope.  Endocrine: Negative for cold intolerance.  Hematologic/Lymphatic: Does not bruise/bleed easily.  Musculoskeletal: Positive for back pain. Negative for joint swelling.  Gastrointestinal: Negative for abdominal pain, anorexia, change in bowel habit, hematochezia and melena.  Neurological: Negative for headaches and light-headedness.  Psychiatric/Behavioral: Negative for depression and substance abuse.  All other systems  reviewed and are negative.  Objective  Blood pressure 119/72, pulse 64, temperature 97.6 F (36.4 C), temperature source Temporal, resp. rate 16, height 6' (1.829 m), weight 179 lb 4.8 oz (81.3 kg), SpO2 100 %. Body mass index is 24.32 kg/m.    Physical Exam  Constitutional: He appears well-developed and well-nourished. No distress.  HENT:  Head: Atraumatic.  Eyes: Conjunctivae are normal.  Neck: No thyromegaly present.  Cardiovascular: Regular rhythm, S1 normal, normal heart sounds and intact distal pulses. Exam reveals no gallop, no S3 and no S4.  No murmur heard. Pulses:      Carotid pulses are 2+ on the right side and 2+ on the left side.      Femoral pulses are on the right side with bruit and on the left side with bruit.      Dorsalis pedis pulses are 1+ on the right side and 1+ on the left side.       Posterior tibial pulses are 1+ on the right side and 1+ on the left side.  Popliteal pulse is prominent and right appears to be 2 cm.  No JVD or leg edema.   Pulmonary/Chest: Effort normal and breath  sounds normal.  Abdominal: Soft. Bowel sounds are normal.  Musculoskeletal:        General: Normal range of motion.     Cervical back: Neck supple.  Neurological: He is alert.  Skin: Skin is warm and dry.  Psychiatric: He has a normal mood and affect.   Radiology: No results found.  Laboratory examination:    CMP Latest Ref Rng & Units 01/10/2019 05/26/2018 05/24/2018  Glucose 65 - 99 mg/dL 89 114(H) 106(H)  BUN 6 - 24 mg/dL 8 8 10   Creatinine 0.76 - 1.27 mg/dL 0.69(L) 0.74 0.54(L)  Sodium 134 - 144 mmol/L 137 131(L) 131(L)  Potassium 3.5 - 5.2 mmol/L 4.8 4.5 4.2  Chloride 96 - 106 mmol/L 99 94(L) 93(L)  CO2 20 - 29 mmol/L 22 28 27   Calcium 8.7 - 10.2 mg/dL 9.3 8.8(L) 8.6(L)  Total Protein 6.5 - 8.1 g/dL - - -  Total Bilirubin 0.3 - 1.2 mg/dL - - -  Alkaline Phos 38 - 126 U/L - - -  AST 15 - 41 U/L - - -  ALT 0 - 44 U/L - - -   CBC Latest Ref Rng & Units 05/24/2018 05/23/2018 05/22/2018  WBC 4.0 - 10.5 K/uL 6.4 7.9 8.9  Hemoglobin 13.0 - 17.0 g/dL 10.7(L) 10.4(L) 12.3(L)  Hematocrit 39.0 - 52.0 % 32.7(L) 32.3(L) 37.6(L)  Platelets 150 - 400 K/uL 167 129(L) 116(L)   HEMOGLOBIN A1C Lab Results  Component Value Date   HGBA1C 5.4 05/16/2018   MPG 108.28 05/16/2018   TSH No results for input(s): TSH in the last 8760 hours. Labs 03/30/2019: HB 14.0/HCT 42.0, platelets 262.  Potassium 5.8, BUN 9, creatinine 0.74, eGFR >60 ML.    Total cholesterol 159, triglycerides 48, HDL 72, LDL 77.  PSA normal.  Medications   Medications Discontinued During This Encounter  Medication Reason  . amiodarone (PACERONE) 200 MG tablet    Current Meds  Medication Sig  . acetaminophen (TYLENOL) 500 MG tablet Take 1,000 mg by mouth every 8 (eight) hours as needed for moderate pain.  Marland Kitchen amiodarone (PACERONE) 200 MG tablet Take 0.5 tablets (100 mg total) by mouth daily.  Marland Kitchen amoxicillin (AMOXIL) 500 MG capsule TAKE 4 CAPSULES AS 1 DOSE.  Marland Kitchen Ascorbic Acid (VITAMIN C) 1000 MG tablet Take 1,000 mg  by mouth daily.  Marland Kitchen lisinopril (ZESTRIL) 10 MG tablet TAKE 1 TABLET ONCE DAILY.  Marland Kitchen lovastatin (MEVACOR) 20 MG tablet Take 1 tablet (20 mg total) by mouth at bedtime.  . metoprolol succinate (TOPROL-XL) 50 MG 24 hr tablet Take 1 tablet (50 mg total) by mouth daily. Take with or immediately following a meal.  . Multiple Vitamin (MULTIVITAMIN) capsule Take 1 capsule by mouth daily.  . nitroGLYCERIN (NITROSTAT) 0.4 MG SL tablet Place 1 tablet (0.4 mg total) under the tongue every 5 (five) minutes as needed for chest pain.  . rivaroxaban (XARELTO) 20 MG TABS tablet Take 1 tablet (20 mg total) by mouth daily with supper.  . [DISCONTINUED] amiodarone (PACERONE) 200 MG tablet TAKE 1 TABLET ONCE DAILY.    Cardiac Studies:   Echocardiogram 06/30/2018: Left ventricle cavity is normal in size. Moderate concentric hypertrophy of the left ventricle. Mild decrease in global wall motion. Visual EF is 45-50%. Calculated EF 50%. Left atrial cavity is severely dilated. S/p mitral valve repair with mild eccentric residual mitral regurgitation seen. No significant mitral stenosis. Mild tricuspid regurgitation.  No evidence of pulmonary hypertension. Compared to previous study on 03/29/2018, mitral valve repair is new.  Coronary angiogram 04/22/2018: Patent stents placed for acute inferior MI on 12/23/2010 S/P mid RCA non drug eluting stent implantation, staged proximal LAD DES stent 12/26/2010. PL branch of RCA chronically occluded with collaterals from LAD. Mid LAD distal to stent 50% stenosis. Mild pulmonary hypertension.  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 Patent endovascular aneurysm repair with no evidence of endoleak. 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  Direct current  cardioversion: 01/17/2019 Procedure: Using 120 mg of IV Propofol and 80 mg IV Lidocaine (for reducing venous pain) for achieving deep sedation, synchronized direct current cardioversion performed. Patient was delivered with 120 Joules of electricity X 1 with success to NSR. Patient tolerated the procedure well. No immediate complication noted.   Assessment     ICD-10-CM   1. Paroxysmal atrial fibrillation (HCC) CHA2DS2-VASc Score is 2.  Yearly risk of stroke: 2.3%  I48.0 EKG 12-Lead    amiodarone (PACERONE) 200 MG tablet  2. S/P mitral valve repair  Z98.890   3. Coronary artery disease involving native coronary artery of native heart without angina pectoris  I25.10   4. S/P AAA repair using straight graft  Z98.890 PCV LOWER ARTERIAL (BILATERAL)   Z86.79   5. Popliteal artery aneurysm (HCC)  I72.4 PCV LOWER ARTERIAL (BILATERAL)    EKG 06/26/2019: Normal sinus rhythm at rate of 63 bpm, normal axis, no evidence of ischemia.  Normal QT interval.  EKG 12/05/2018: Atrial fibrillation with rapid ventricular response at the rate of 111 bpm, normal axis, IVCD, LVH.  Normal QT interval.  No evidence of ischemia. No significant change from  EKG 10/04/2017: Atrial fibrillation with controlled response at the rate of 66 bpm.  Recommendations:   Earl Saxon "Earl" Martin is a 58 y.o. Caucasian male with CAD, severe MR noted in June 2018, hypertension, hyperlipidemia, atrial fibrillation and chronic back pain due to spinal stenosis. He also has abdominal aortic aneurysm status post repair on 11/29/2014 by placement of stent graft by Dr. Annamarie Major.  H/O mitral valve repair and single-vessel coronary bypass surgery,  maze procedure and left atrial  appendage clipping performed on 05/18/2018.   He is maintaining sinus rhythm since cardioversion on 01/17/2019 and is presently on amiodarone. Advised him to continue Amio but reduce to 100 mg daily. Reviewed her labs from his PCP.  Labs are stable with stable renal  function and also stable CBC and LFTs.  He is tolerating anticoagulation without any bleeding complications. Blood pressure is well controlled, no clinical evidence of heart failure. He is aware that he needs endocarditis prophylaxis.   On physical exam, along with bilateral femoral arterial bruit, I do feel his right popliteal artery pulse to be much more prominent and appears to be at least 2 cm, we will obtain lower extremity arterial duplex to exclude popliteal artery aneurysm especially in view of abdominal aortic aneurysm S/P repair. I'd like to see him back in 6 months.   Adrian Prows, MD, [facility] 06/26/2019, 12:47 PM  [facility]

## 2019-07-24 ENCOUNTER — Ambulatory Visit: Payer: BLUE CROSS/BLUE SHIELD | Admitting: Cardiology

## 2019-08-16 ENCOUNTER — Other Ambulatory Visit: Payer: Self-pay

## 2019-08-16 ENCOUNTER — Ambulatory Visit: Payer: BLUE CROSS/BLUE SHIELD

## 2019-08-16 DIAGNOSIS — Z9889 Other specified postprocedural states: Secondary | ICD-10-CM

## 2019-08-16 DIAGNOSIS — Z8679 Personal history of other diseases of the circulatory system: Secondary | ICD-10-CM

## 2019-08-16 DIAGNOSIS — I724 Aneurysm of artery of lower extremity: Secondary | ICD-10-CM

## 2019-09-04 ENCOUNTER — Other Ambulatory Visit: Payer: Self-pay | Admitting: Cardiology

## 2019-09-04 DIAGNOSIS — Z9889 Other specified postprocedural states: Secondary | ICD-10-CM

## 2019-10-05 ENCOUNTER — Other Ambulatory Visit: Payer: Self-pay | Admitting: Cardiology

## 2019-10-05 DIAGNOSIS — Z9889 Other specified postprocedural states: Secondary | ICD-10-CM

## 2019-11-09 IMAGING — DX DG CHEST 1V PORT
1 series · 2 of 2 positions shown · non-contrast
Comparison: Earlier same day.

CLINICAL DATA: Followup pneumothorax

EXAM:
PORTABLE CHEST 1 VIEW

[Series 1: chest · 0.14mm/px · 2 of 2 slices shown]
[im 1/2]
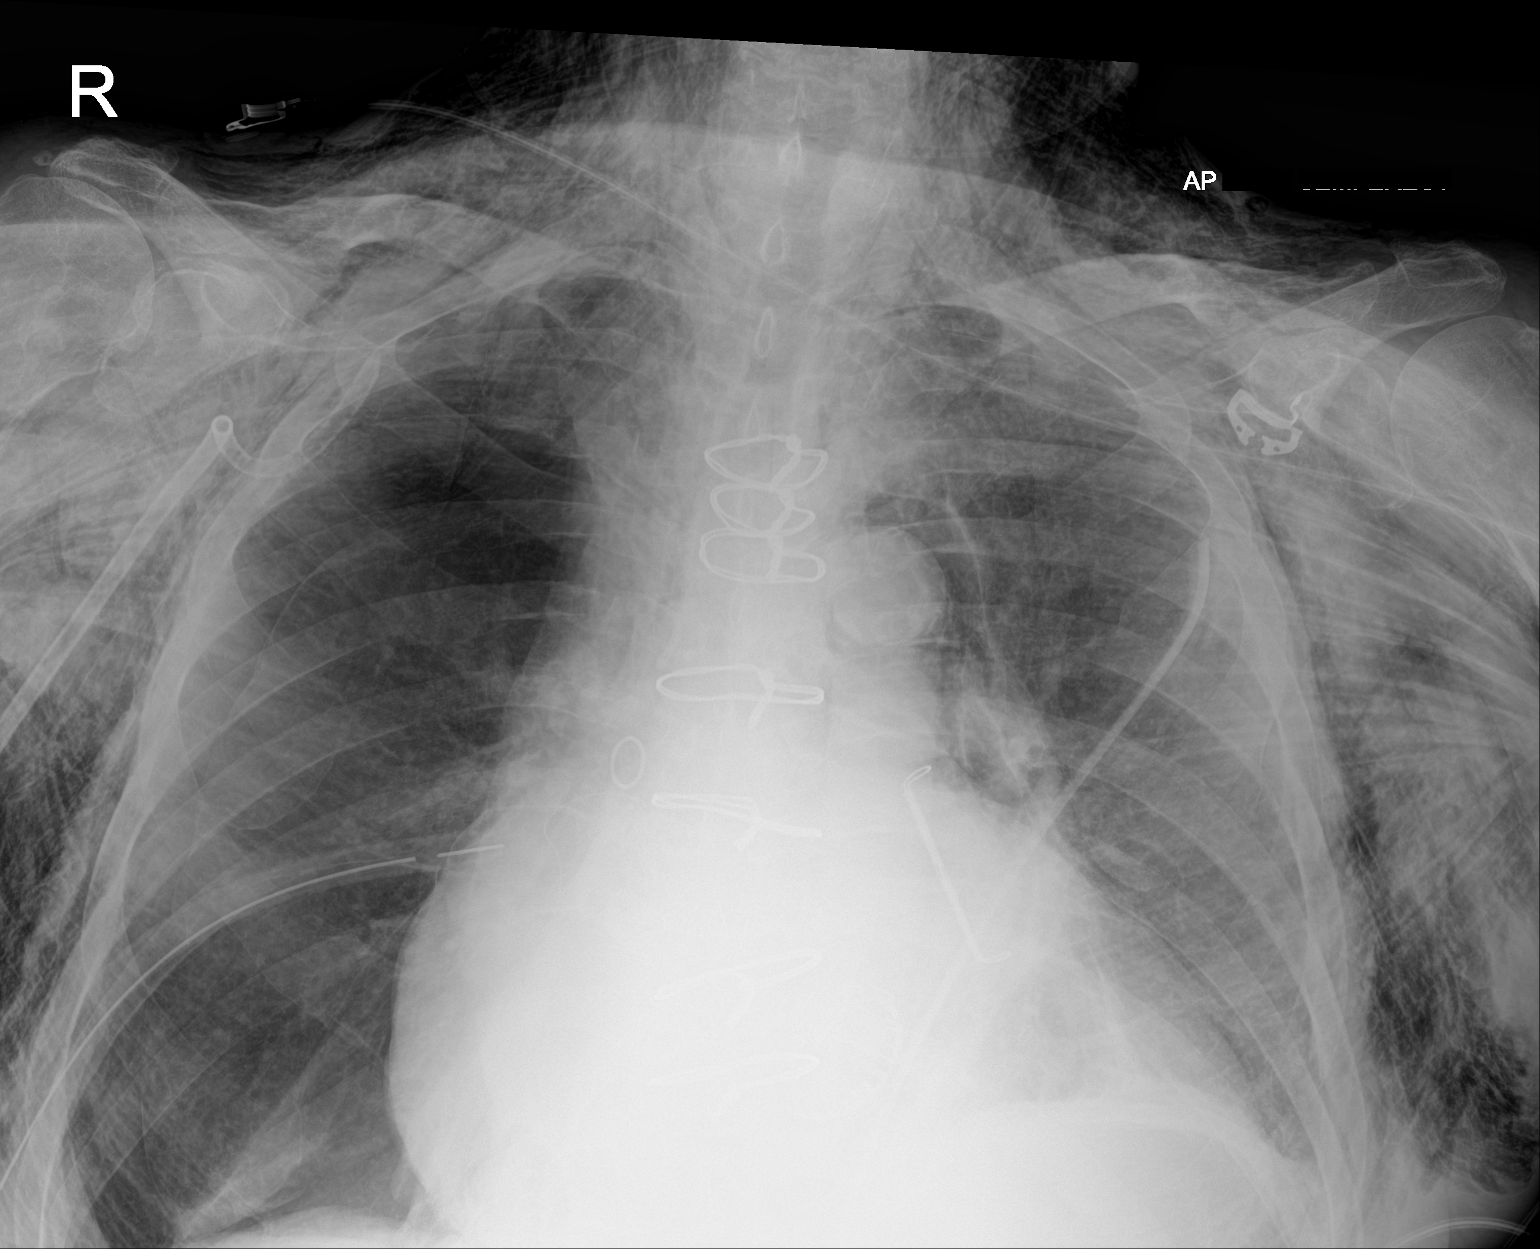
[im 2/2]
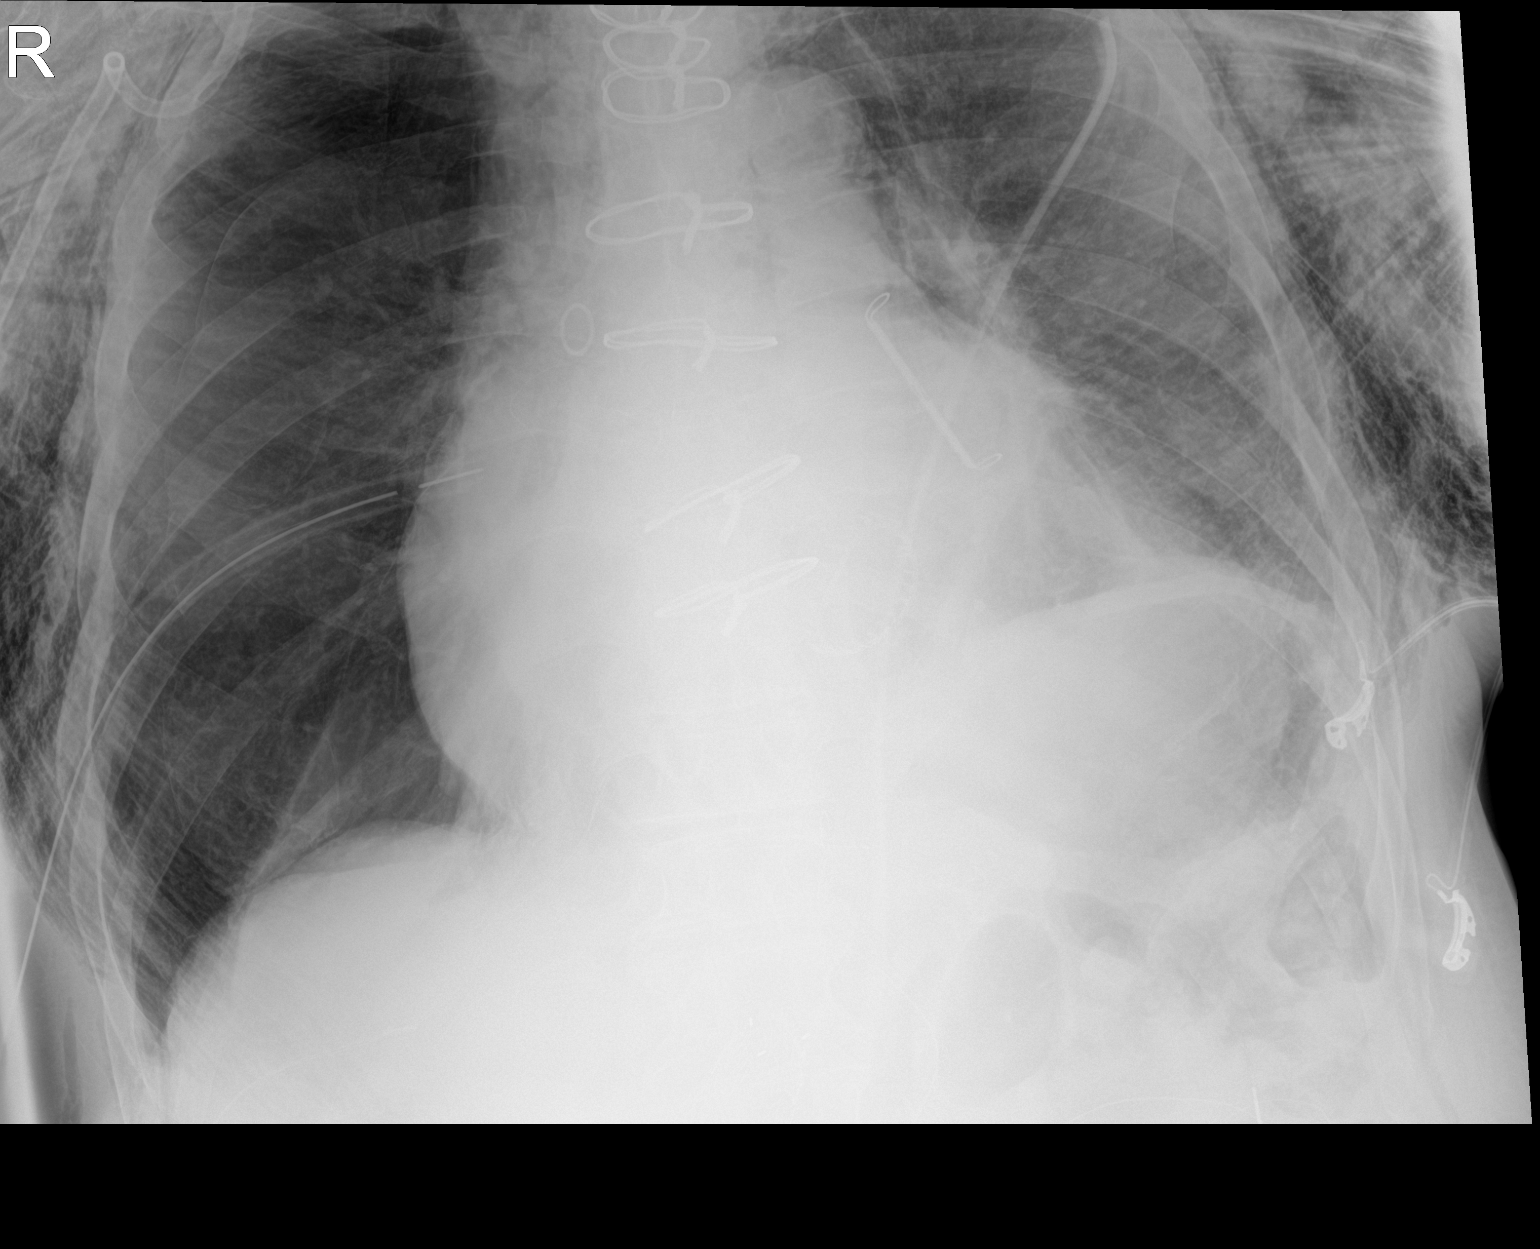

[2 of 2 positions shown; findings below may reference images not displayed]

FINDINGS: Small bore chest tube remains in the right upper chest. Large-bore
chest tube introduced in the lower chest on the right. Persistent
right pneumothorax, the same or even slightly larger when compared
to the previous study. Left chest tube remains in place.
Pneumomediastinum as seen previously. Extensive subcutaneous
emphysema. Persistent volume loss in the lower lobes left more than
right.
IMPRESSION: New large bore right chest tube placed. Pneumothorax is persistent
and may be even larger in seen previously.

## 2019-11-10 ENCOUNTER — Other Ambulatory Visit: Payer: Self-pay | Admitting: Cardiology

## 2019-11-10 DIAGNOSIS — I4821 Permanent atrial fibrillation: Secondary | ICD-10-CM

## 2019-11-10 IMAGING — DX DG CHEST 1V PORT
1 series · 1 of 1 positions shown · non-contrast
Comparison: 05/21/2018

CLINICAL DATA: Follow-up bilateral pneumothoraces. Postop CABG and
mitral valve repair.

EXAM:
PORTABLE CHEST 1 VIEW

[chest]
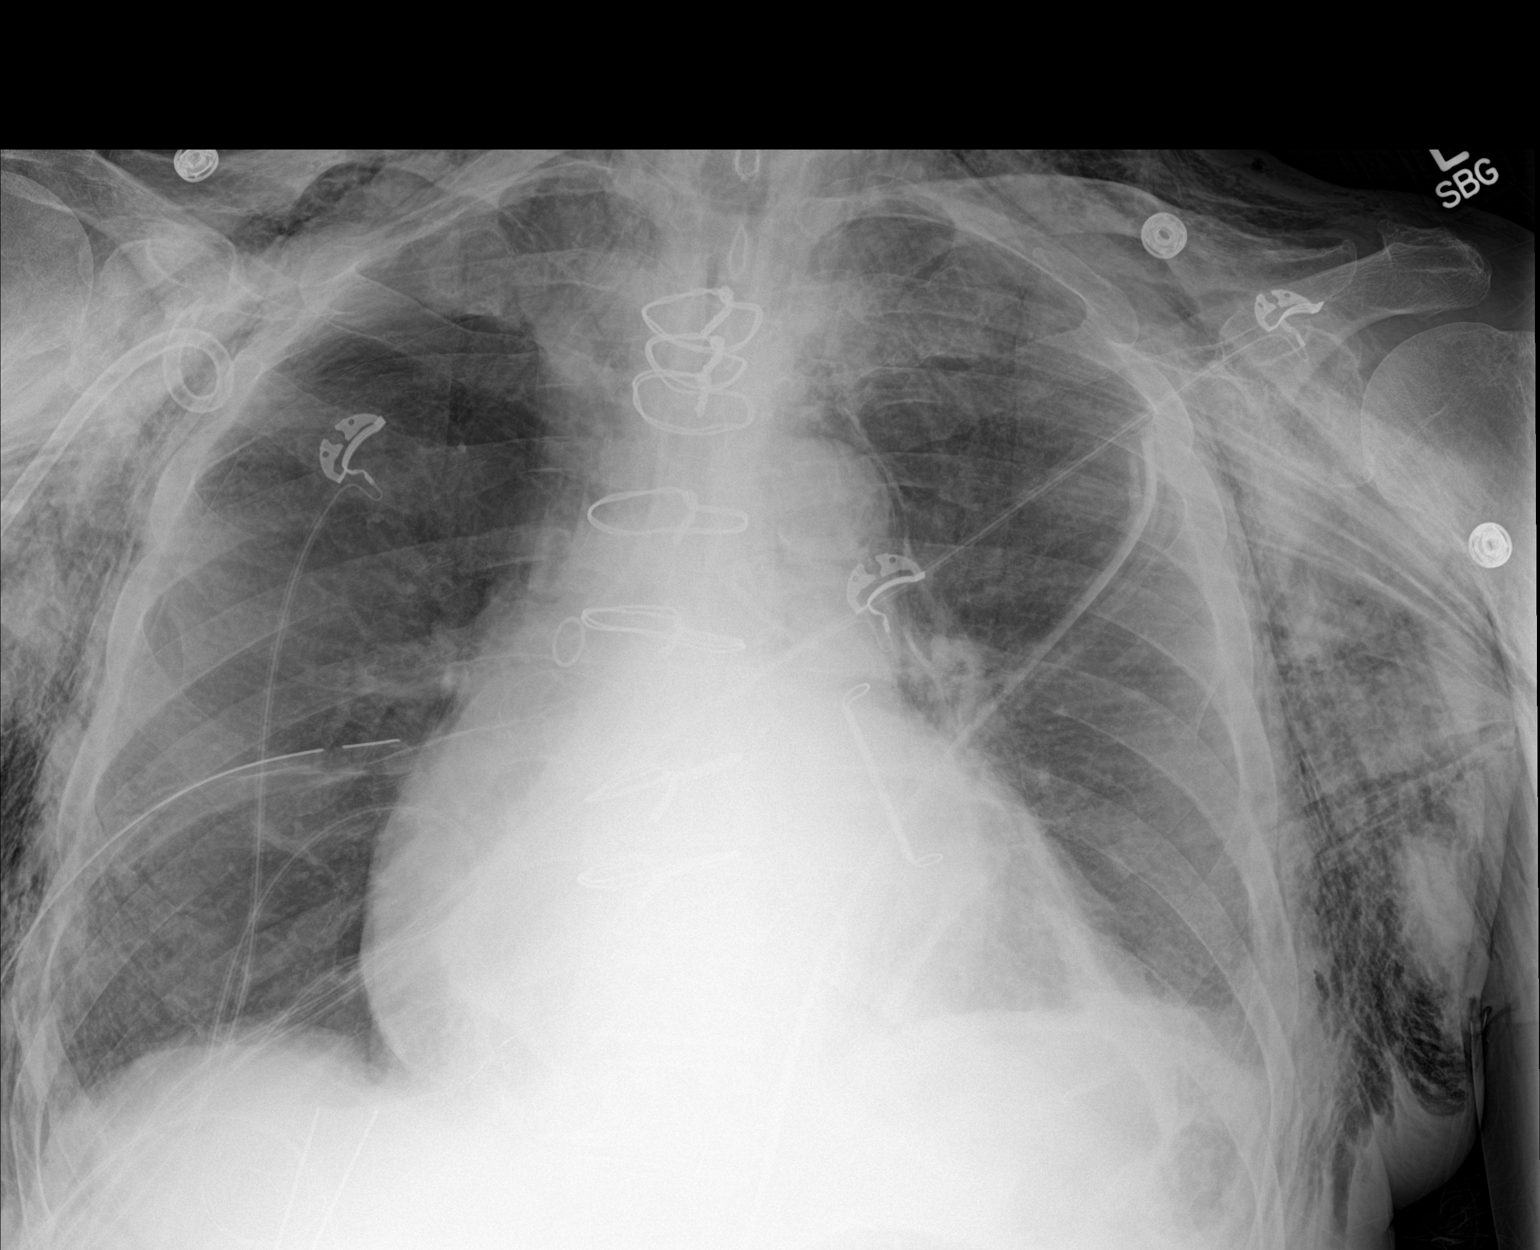

[1 of 1 positions shown; findings below may reference images not displayed]

FINDINGS: Right chest pigtail catheter is located in the right chest wall soft
tissues and is come out of the pleural space since previous study. A
20-25% right pneumothorax is increased in size since previous study.

Left chest tube remains in place. A tiny less than 10% left
pneumothorax is unchanged. Left lower lobe atelectasis is also
stable.

Stable cardiomegaly. Extensive subcutaneous emphysema is again seen
throughout the chest wall soft tissues.
IMPRESSION: Increased 20-25% right pneumothorax. Right chest pigtail catheter is
now out of the pleural space, now located in the chest wall soft
tissues.

Stable tiny left pneumothorax and left lower lobe atelectasis.

Critical Value/emergent results were called by telephone at the time
of interpretation on 05/22/2018 at [DATE] to Dr. FOUZIA MCAULIFFE
, who verbally acknowledged these results.

## 2019-11-11 IMAGING — DX DG CHEST 1V PORT
1 series · 1 of 1 positions shown · non-contrast
Comparison: 05/22/2018

CLINICAL DATA: Chest tube, shortness of breath

EXAM:
PORTABLE CHEST 1 VIEW

[chest]
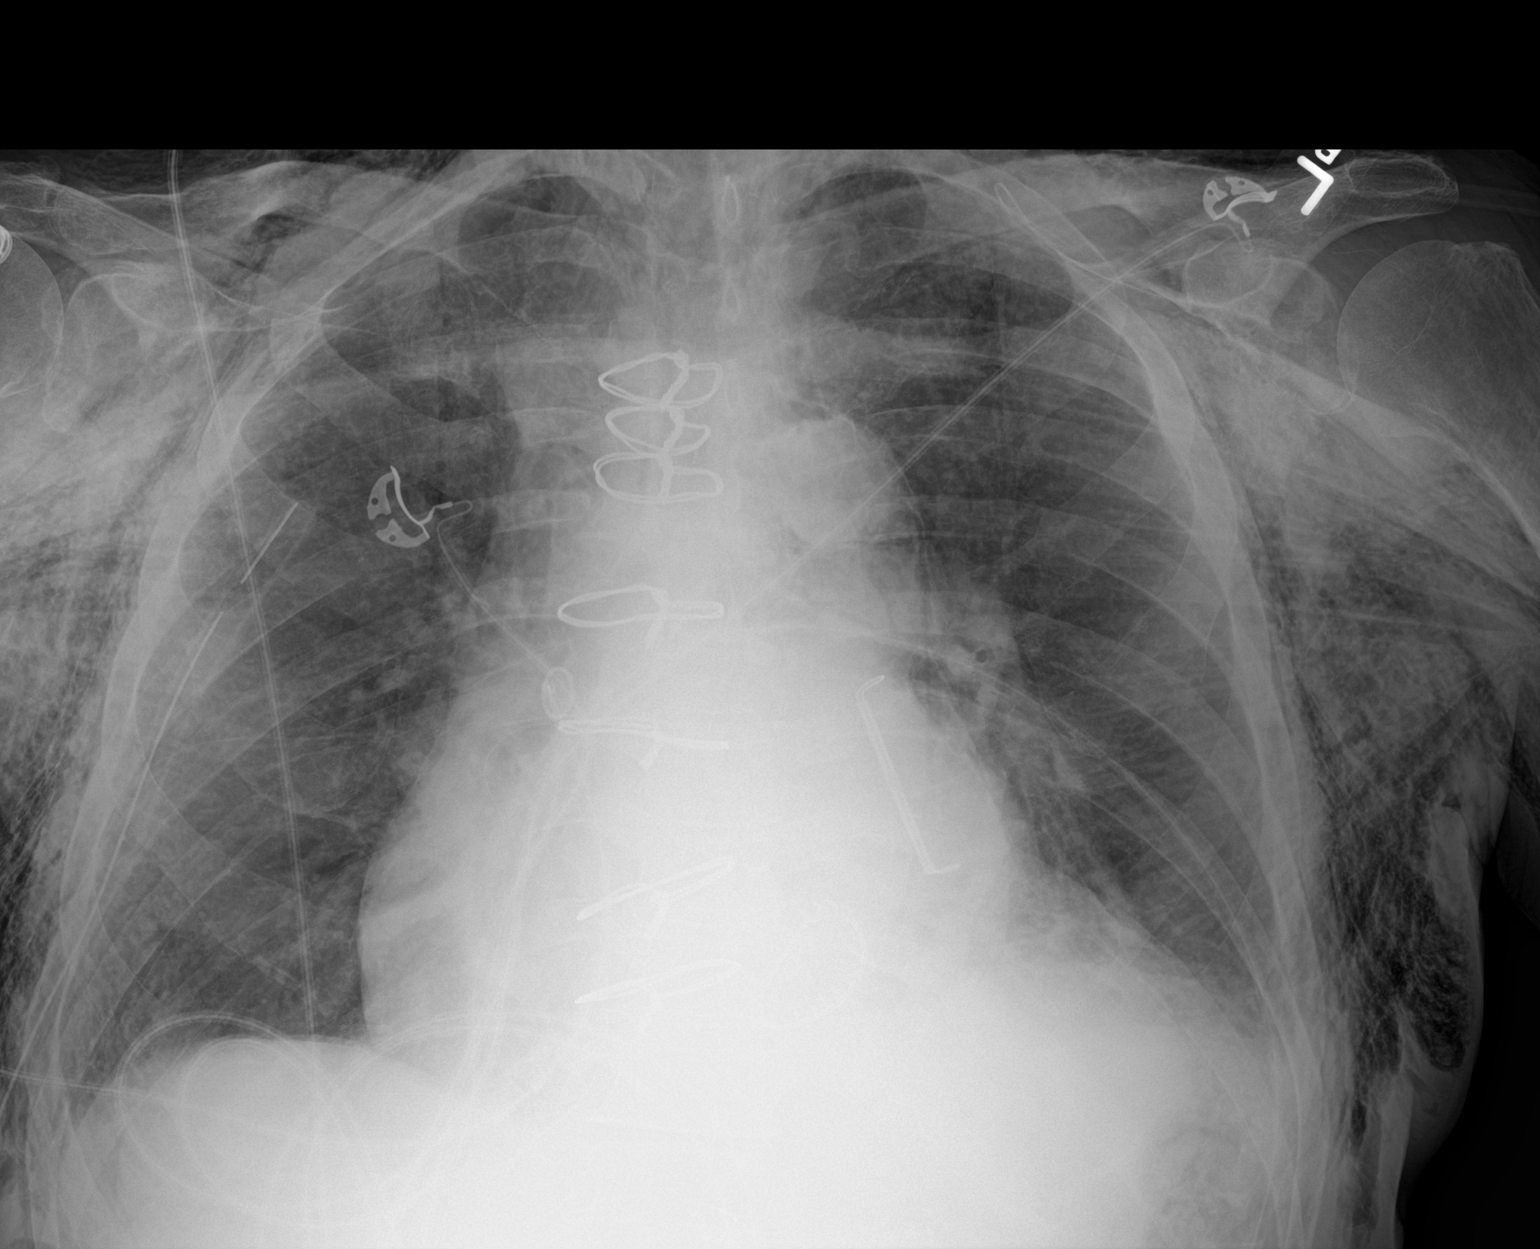

[1 of 1 positions shown; findings below may reference images not displayed]

FINDINGS: Interval removal of left chest tube and pigtail catheter noted in
the right chest wall. Right chest tube remains in place. Diffuse
subcutaneous emphysema. No visible pneumothorax. Prior median
sternotomy, CABG and valve replacement. Cardiomegaly. Left lower
lobe atelectasis or infiltrate, similar to prior study.
IMPRESSION: Extensive subcutaneous emphysema, stable.  No visible pneumothorax.

Cardiomegaly. Left base atelectasis or infiltrate. Findings stable.

## 2019-11-12 IMAGING — DX DG CHEST 1V PORT
2 series · 2 of 2 positions shown · non-contrast
Comparison: 05/23/2018

CLINICAL DATA: Surgery follow-up

EXAM:
PORTABLE CHEST 1 VIEW

[chest ap (1 of 2)]
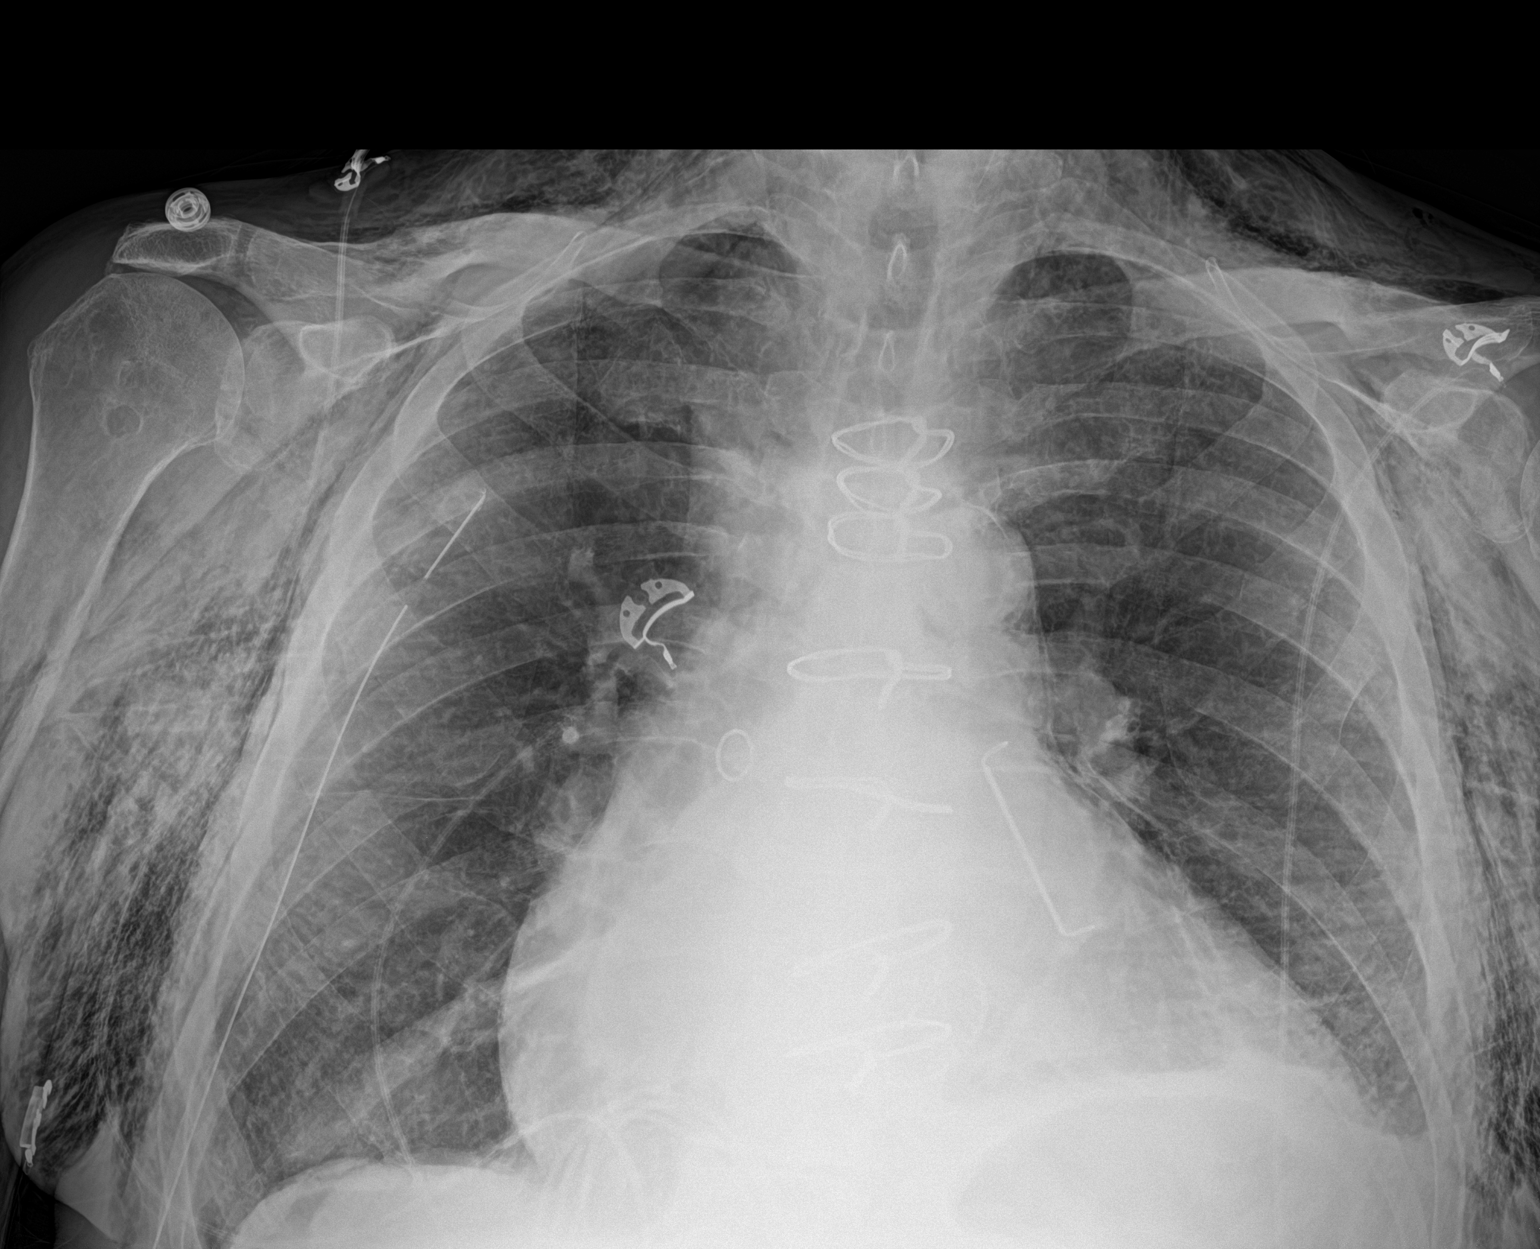

[chest ap (2 of 2)]
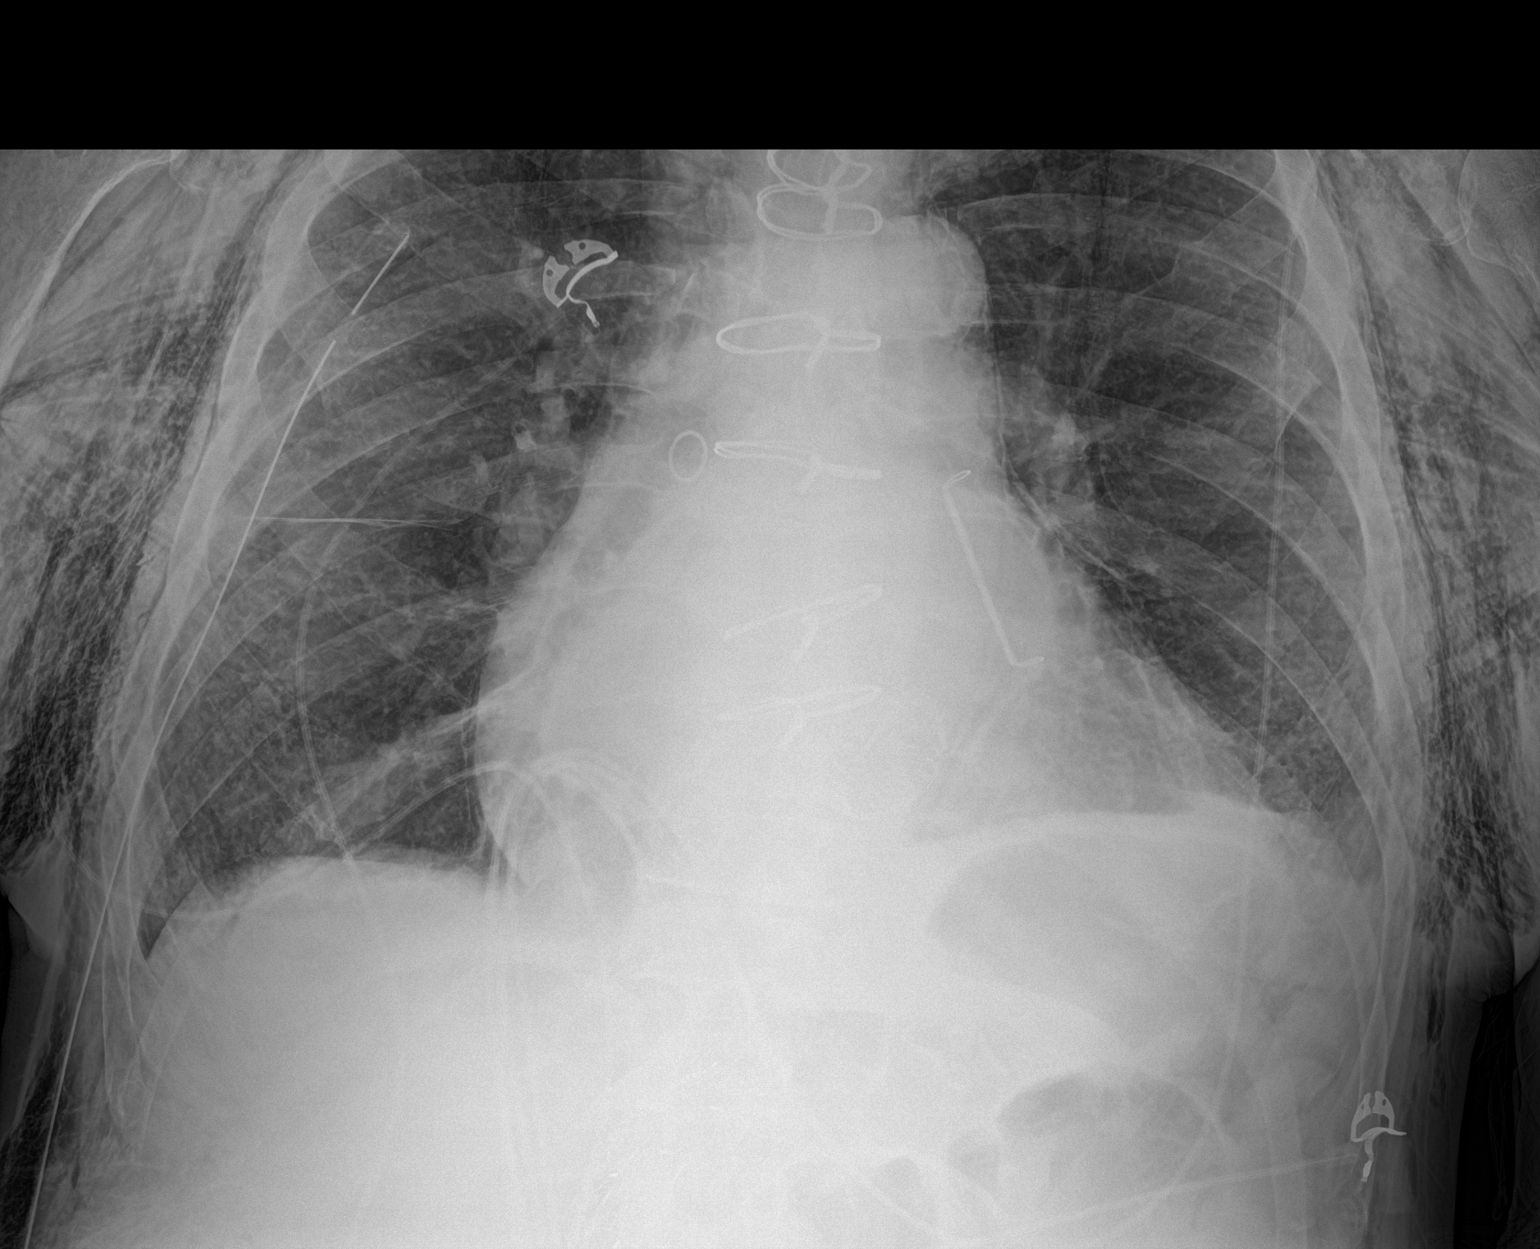

[2 of 2 positions shown; findings below may reference images not displayed]

FINDINGS: Diffuse subcutaneous emphysema again noted. Right chest tube remains
in place, unchanged. Small right pneumothorax, approximately 10%.
Cardiomegaly. Left base opacity likely reflects atelectasis.
IMPRESSION: Small right apical pneumothorax with right chest tube remaining in
stable position. Diffuse subcutaneous emphysema.

Cardiomegaly, left base atelectasis.

## 2019-11-23 ENCOUNTER — Telehealth: Payer: Self-pay

## 2019-11-23 DIAGNOSIS — E7800 Pure hypercholesterolemia, unspecified: Secondary | ICD-10-CM

## 2019-11-23 MED ORDER — LOVASTATIN 40 MG PO TABS: 40.0000 mg | ORAL_TABLET | Freq: Every day | ORAL | 3 refills | Status: DC

## 2019-11-23 NOTE — Telephone Encounter (Signed)
He is on it. In the chart as mevacor 20 mg. I am fine to dispense 90 every evening with 3 refills

## 2019-11-23 NOTE — Telephone Encounter (Signed)
Patient says you increased Lovastatin to 40mg  daily. I do not see this in his chart. Pharmacy is calling for a new prescription to be sent in.

## 2019-12-08 ENCOUNTER — Other Ambulatory Visit: Payer: Self-pay | Admitting: Cardiology

## 2019-12-08 DIAGNOSIS — I4821 Permanent atrial fibrillation: Secondary | ICD-10-CM

## 2019-12-14 ENCOUNTER — Other Ambulatory Visit: Payer: Self-pay | Admitting: Cardiology

## 2019-12-14 ENCOUNTER — Telehealth: Payer: Self-pay

## 2019-12-14 DIAGNOSIS — I714 Abdominal aortic aneurysm, without rupture, unspecified: Secondary | ICD-10-CM

## 2019-12-14 DIAGNOSIS — I2510 Atherosclerotic heart disease of native coronary artery without angina pectoris: Secondary | ICD-10-CM

## 2019-12-14 MED ORDER — LISINOPRIL 20 MG PO TABS: 20.0000 mg | ORAL_TABLET | Freq: Every day | ORAL | 3 refills | Status: DC

## 2019-12-14 NOTE — Telephone Encounter (Signed)
Received call from pharmacy in regards to Lisinopril dosage. Per pharmacist, patient states he was increased to  Lisinopril 20 mg. However the listed dose on his chart is Lisinopril 10mg . Patient states he is running low on medication. What dosage should pt be taking? Please advise. Thanks!

## 2019-12-16 ENCOUNTER — Other Ambulatory Visit: Payer: Self-pay | Admitting: Cardiology

## 2019-12-16 DIAGNOSIS — I4821 Permanent atrial fibrillation: Secondary | ICD-10-CM

## 2019-12-25 ENCOUNTER — Ambulatory Visit: Payer: BC Managed Care – PPO | Admitting: Cardiology

## 2020-01-16 ENCOUNTER — Other Ambulatory Visit: Payer: Self-pay | Admitting: Cardiology

## 2020-01-16 DIAGNOSIS — I4821 Permanent atrial fibrillation: Secondary | ICD-10-CM

## 2020-02-15 ENCOUNTER — Ambulatory Visit: Payer: BLUE CROSS/BLUE SHIELD | Admitting: Cardiology

## 2020-02-26 ENCOUNTER — Encounter: Payer: Self-pay | Admitting: Cardiology

## 2020-02-26 ENCOUNTER — Ambulatory Visit: Payer: BLUE CROSS/BLUE SHIELD | Admitting: Cardiology

## 2020-02-26 ENCOUNTER — Other Ambulatory Visit: Payer: Self-pay | Admitting: Cardiology

## 2020-02-26 ENCOUNTER — Other Ambulatory Visit: Payer: Self-pay

## 2020-02-26 VITALS — BP 131/82 | HR 53 | Resp 17 | Ht 72.0 in | Wt 190.0 lb

## 2020-02-26 DIAGNOSIS — Z298 Encounter for other specified prophylactic measures: Secondary | ICD-10-CM

## 2020-02-26 DIAGNOSIS — I10 Essential (primary) hypertension: Secondary | ICD-10-CM

## 2020-02-26 DIAGNOSIS — G453 Amaurosis fugax: Secondary | ICD-10-CM

## 2020-02-26 DIAGNOSIS — I723 Aneurysm of iliac artery: Secondary | ICD-10-CM

## 2020-02-26 DIAGNOSIS — Z720 Tobacco use: Secondary | ICD-10-CM

## 2020-02-26 DIAGNOSIS — I2510 Atherosclerotic heart disease of native coronary artery without angina pectoris: Secondary | ICD-10-CM

## 2020-02-26 DIAGNOSIS — I480 Paroxysmal atrial fibrillation: Secondary | ICD-10-CM

## 2020-02-26 DIAGNOSIS — Z8679 Personal history of other diseases of the circulatory system: Secondary | ICD-10-CM

## 2020-02-26 DIAGNOSIS — Z9889 Other specified postprocedural states: Secondary | ICD-10-CM

## 2020-02-26 MED ORDER — NICOTINE 14 MG/24HR TD PT24: 14.0000 mg | MEDICATED_PATCH | Freq: Every day | TRANSDERMAL | 0 refills | Status: DC

## 2020-02-26 MED ORDER — NICOTINE 21 MG/24HR TD PT24: 21.0000 mg | MEDICATED_PATCH | Freq: Every day | TRANSDERMAL | 0 refills | Status: DC

## 2020-02-26 MED ORDER — BUPROPION HCL ER (SR) 150 MG PO TB12: 150.0000 mg | ORAL_TABLET | Freq: Two times a day (BID) | ORAL | 4 refills | Status: DC

## 2020-02-26 MED ORDER — ASPIRIN 81 MG PO CHEW: 81.0000 mg | CHEWABLE_TABLET | Freq: Every day | ORAL | Status: DC

## 2020-02-26 NOTE — Progress Notes (Signed)
Primary Physician/Referring:  Jilda Panda, MD  Patient ID: Earl Martin, male    DOB: 1961/03/16, 59 y.o.   MRN: [id number]  Chief Complaint  Patient presents with  . Atrial Fibrillation  . Coronary Artery Disease  . PAD  . Follow-up    6 month    HPI: Earl Martin  is a 59 y.o. male "Earl" Martin with CAD, severe MR noted in June 2018, hypertension, hyperlipidemia, atrial fibrillation and chronic back pain due to spinal stenosis. Earl Martin also has abdominal aortic aneurysm status post repair on 11/29/2014 by placement of stent graft by Dr. Annamarie Major.  H/O mitral valve repair and single-vessel coronary bypass surgery,  maze procedure and left atrial appendage clipping performed on 05/18/2018.   Earl Martin is maintaining sinus rhythm since cardioversion on 01/17/2019.  Earl Martin has had occasional brief palpitations.  Earl Martin is tolerating anticoagulation without any bleeding complications. Earl Martin is a cigarette smoker unfortunately. 1/2 PPD.  For the first time Earl Martin is requesting smoking cessation therapy.  Earl Martin also complains of partial visual loss especially the left eye that occurs rarely and start from the bottom up and last for about 3 to 5 minutes.  This started about 6 months ago.  Earl Martin has not had any chest pain, Earl Martin has not used S/L NTG since last office visit 6 months ago.  No claudication.  Past Medical History:  Diagnosis Date  . Atrial fibrillation, persistent ([place])   . Complication of anesthesia    belligerant when awakes  . Coronary artery disease   . Dysrhythmia   . Headache   . Heart murmur   . Hypercholesterolemia   . Hypertension    essential  . Mitral regurgitation   . Myocardial infarction ([place]) 12/25/2010  . Pneumonia    hx  . S/P CABG x 1 05/18/2018   SVG to RPL, EVH via right thigh  . S/P Maze operation for atrial fibrillation 05/18/2018   Complete bilateral atrial lesion set using bipolar radiofrequency and cryothermy ablation with clipping of LA appendage  . S/P mitral valve  repair 05/18/2018   34 mm Sorin Memo 4D ring annuloplasty  . Sleep apnea    does not use cpap  . Spinal stenosis     Past Surgical History:  Procedure Laterality Date  . ABDOMINAL AORTIC ENDOVASCULAR STENT GRAFT N/A 11/29/2014   Procedure: ABDOMINAL AORTIC ENDOVASCULAR STENT GRAFT;  Surgeon: Serafina Mitchell, MD;  Location: [facility];  Service: Vascular;  Laterality: N/A;  . CARDIAC CATHETERIZATION    . CARDIOVERSION N/A 09/20/2012   Procedure: CARDIOVERSION;  Surgeon: Laverda Page, MD;  Location: [facility];  Service: Cardiovascular;  Laterality: N/A;  . CARDIOVERSION N/A 01/17/2019   Procedure: CARDIOVERSION;  Surgeon: Adrian Prows, MD;  Location: [facility];  Service: Cardiovascular;  Laterality: N/A;  . CORONARY ARTERY BYPASS GRAFT N/A 05/18/2018   Procedure: CORONARY ARTERY BYPASS GRAFTING (CABG), ON PUMP, TIMES ONE, USING ENDOSCOPICALLY HARVESTED RIGHT GREATER SAPHENOUS VEIN;  Surgeon: Rexene Alberts, MD;  Location: [facility];  Service: Open Heart Surgery;  Laterality: N/A;  . CORONARY STENT PLACEMENT  12/25/2010   RCA  . CORONARY STENT PLACEMENT  12/26/2010   LAD  . HAND NERVE GRAFT    . INGUINAL HERNIA REPAIR Left 03/07/2013   Procedure: LEFT INGUINAL HERNIA REPAIR;  Surgeon: Rolm Bookbinder, MD;  Location: [facility];  Service: General;  Laterality: Left;  . INSERTION OF MESH Left 03/07/2013   Procedure: INSERTION OF MESH;  Surgeon: Rolm Bookbinder, MD;  Location[facility];  Service: General;  Laterality: Left;  Marland Kitchen MAZE N/A 05/18/2018   Procedure: MAZE;  Surgeon: Rexene Alberts, MD;  Location: [facility];  Service: Open Heart Surgery;  Laterality: N/A;  . MITRAL VALVE REPAIR N/A 05/18/2018   Procedure: MITRAL VALVE REPAIR (MVR);  Surgeon: Rexene Alberts, MD;  Location: [facility];  Service: Open Heart Surgery;  Laterality: N/A;  Using Memo 4D Semirigid Annuloplasty Ring Size 34  . RIGHT/LEFT HEART CATH AND CORONARY ANGIOGRAPHY N/A 04/22/2018   Procedure: RIGHT/LEFT HEART CATH AND CORONARY  ANGIOGRAPHY;  Surgeon: Adrian Prows, MD;  Location: [facility];  Service: Cardiovascular;  Laterality: N/A;  . ROTATOR CUFF REPAIR    . TEE WITHOUT CARDIOVERSION N/A 02/04/2016   Procedure: TRANSESOPHAGEAL ECHOCARDIOGRAM (TEE);  Surgeon: Adrian Prows, MD;  Location: [facility];  Service: Cardiovascular;  Laterality: N/A;  . TEE WITHOUT CARDIOVERSION N/A 04/22/2018   Procedure: TRANSESOPHAGEAL ECHOCARDIOGRAM (TEE);  Surgeon: Adrian Prows, MD;  Location: [facility];  Service: Cardiovascular;  Laterality: N/A;  . TEE WITHOUT CARDIOVERSION N/A 05/18/2018   Procedure: TRANSESOPHAGEAL ECHOCARDIOGRAM (TEE);  Surgeon: Rexene Alberts, MD;  Location: [facility];  Service: Open Heart Surgery;  Laterality: N/A;   Social History   Tobacco Use  . Smoking status: Current Every Day Smoker    Packs/day: 0.50    Years: 30.00    Pack years: 15.00    Types: Cigarettes  . Smokeless tobacco: Never Used  Substance Use Topics  . Alcohol use: Yes    Alcohol/week: 14.0 standard drinks    Types: 14 Cans of beer per week    Comment:  " 2-3 beers daily "   Marital Status: Divorced  Review of Systems  Cardiovascular: Positive for palpitations. Negative for chest pain, dyspnea on exertion and leg swelling.  Musculoskeletal: Positive for back pain and joint pain.  Gastrointestinal: Negative for melena.   Objective  Blood pressure 131/82, pulse (!) 53, resp. rate 17, height 6' (1.829 m), weight 190 lb (86.2 kg), SpO2 98 %. Body mass index is 25.77 kg/m.  Vitals with BMI 02/26/2020 06/26/2019 06/26/2019  Height 6' 0" 6' 0" 6' 0"  Weight 190 lbs 179 lbs 5 oz 179 lbs  BMI 25.76 00.37 04.88  Systolic 891 694 503  Diastolic 82 72 77  Pulse 53 64 77    Physical Exam Constitutional:      General: Earl Martin is not in acute distress.    Appearance: Earl Martin is well-developed.  Eyes:     Conjunctiva/sclera: Conjunctivae normal.  Neck:     Thyroid: No thyromegaly.  Cardiovascular:     Rate and Rhythm: Regular rhythm.      Pulses: Intact distal pulses.          Carotid pulses are 2+ on the right side and 2+ on the left side.      Femoral pulses are on the right side with bruit and on the left side with bruit.      Dorsalis pedis pulses are 1+ on the right side and 1+ on the left side.       Posterior tibial pulses are 1+ on the right side and 1+ on the left side.     Heart sounds: Normal heart sounds and S1 normal. No murmur heard.  No gallop. No S3 or S4 sounds.      Comments: Popliteal pulse is prominent and right appears to be 2 cm.  No JVD or leg edema.  Bilateral varicose veins noted  Pulmonary:     Effort: Pulmonary effort is normal.     Breath sounds: Normal breath sounds.  Abdominal:     General: Bowel sounds are normal.     Palpations: Abdomen is soft.  Musculoskeletal:        General: Normal range of motion.  Skin:    General: Skin is warm and dry.    Radiology: No results found.  Laboratory examination:    CMP Latest Ref Rng & Units 01/10/2019 05/26/2018 05/24/2018  Glucose 65 - 99 mg/dL 89 114(H) 106(H)  BUN 6 - 24 mg/dL _0 Creatinine 0.76 - 1.27 mg/dL 0.69(L) 0.74 0.54(L)  Sodium 134 - 144 mmol/L 137 131(L) 131(L)  Potassium 3.5 - 5.2 mmol/L 4.8 4.5 4.2  Chloride 96 - 106 mmol/L 99 94(L) 93(L)  CO2 20 - 29 mmol/L _1 Calcium 8.7 - 10.2 mg/dL 9.3 8.8(L) 8.6(L)  Total Protein 6.5 - 8.1 g/dL - - -  Total Bilirubin 0.3 - 1.2 mg/dL - - -  Alkaline Phos 38 - 126 U/L - - -  AST 15 - 41 U/L - - -  ALT 0 - 44 U/L - - -   CBC Latest Ref Rng & Units 05/24/2018 05/23/2018 05/22/2018  WBC 4.0 - 10.5 K/uL 6.4 7.9 8.9  Hemoglobin 13.0 - 17.0 g/dL 10.7(L) 10.4(L) 12.3(L)  Hematocrit 39 - 52 % 32.7(L) 32.3(L) 37.6(L)  Platelets 150 - 400 K/uL 167 129(L) 116(L)   HEMOGLOBIN A1C Lab Results  Component Value Date   HGBA1C 5.4 05/16/2018   MPG 108.28 05/16/2018   TSH No results for input(s): TSH in the last 8760 hours.  External Labs:  Labs 03/30/2019:  Hb 14.0/HCT 42.0,  platelets 262.  Sodium 133, potassium 4.8, BUN 9, creatinine 0.74, EGFR greater than 60 mL.  CMP normal.  Total cholesterol 159, triglycerides 48, HDL 72, LDL 77.  Non-HDL cholesterol 87.  Labs 03/30/2019: HB 14.0/HCT 42.0, platelets 262.  Potassium 5.8, BUN 9, creatinine 0.74, eGFR >60 ML.    Total cholesterol 159, triglycerides 48, HDL 72, LDL 77.  PSA normal.  Medications   Current Outpatient Medications on File Prior to Visit  Medication Sig Dispense Refill  . acetaminophen (TYLENOL) 500 MG tablet Take 1,000 mg by mouth every 8 (eight) hours as needed for moderate pain.    Marland Kitchen amiodarone (PACERONE) 200 MG tablet Take 0.5 tablets (100 mg total) by mouth daily. 45 tablet 3  . amoxicillin (AMOXIL) 500 MG capsule TAKE 4 CAPSULES AS 1 DOSE. 8 capsule 2  . Ascorbic Acid (VITAMIN C) 1000 MG tablet Take 1,000 mg by mouth daily.    Marland Kitchen lisinopril (ZESTRIL) 20 MG tablet Take 1 tablet (20 mg total) by mouth daily. 90 tablet 3  . lovastatin (MEVACOR) 40 MG tablet Take 1 tablet (40 mg total) by mouth daily. (Patient taking differently: Take 20 mg by mouth daily. ) 90 tablet 3  . metoprolol succinate (TOPROL-XL) 50 MG 24 hr tablet TAKE 1 TABLET DAILY, TAKE WITH OR FOLLOWING A MEAL. 90 tablet 0  . Multiple Vitamin (MULTIVITAMIN) capsule Take 1 capsule by mouth daily.    . nitroGLYCERIN (NITROSTAT) 0.4 MG SL tablet Place 1 tablet (0.4 mg total) under the tongue every 5 (five) minutes as needed for chest pain. 25 tablet 2  . XARELTO 20 MG TABS tablet TAKE 1 TABLET ONCE DAILY IN THE EVENING AFTER DINNER. 90 tablet 0  . zolpidem (AMBIEN) 10 MG tablet Take 10 mg by mouth  at bedtime as needed.     No current facility-administered medications on file prior to visit.     Cardiac Studies:   Coronary angiogram 04/22/2018: Patent stents placed for acute inferior MI on 12/23/2010 S/P mid RCA non drug eluting stent implantation, staged proximal LAD DES stent 12/26/2010. PL branch of RCA chronically occluded with  collaterals from LAD. Mid LAD distal to stent 50% stenosis. Mild pulmonary hypertension.  CABG and MV Repair and Maze and LAA clipping 05/18/2018: SVG to RCA, Sorin Memo 4D annuloplasty 34 mm ring by Lilly Cove, MD  Echocardiogram 06/30/2018: Left ventricle cavity is normal in size. Moderate concentric hypertrophy of the left ventricle. Mild decrease in global wall motion. Visual EF is 45-50%. Calculated EF 50%. Left atrial cavity is severely dilated. S/p mitral valve repair with mild eccentric residual mitral regurgitation seen. No significant mitral stenosis. Mild tricuspid regurgitation.  No evidence of pulmonary hypertension. Compared to previous study on 03/29/2018, mitral valve repair is new.  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 Patent endovascular aneurysm repair with no evidence of endoleak. 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  Direct current cardioversion: 01/17/2019 Procedure: Using 120 mg of IV Propofol and 80 mg IV Lidocaine (for reducing venous pain) for achieving deep sedation, synchronized direct current cardioversion performed. Patient was delivered with 120 Joules of electricity X 1 with success to NSR. Patient tolerated the procedure well. No immediate complication noted.  Lower Extremity Arterial Duplex 08/16/2019:  No hemodynamically significant stenoses are identified in the lower extremity arterial system. Moderate diffuse calcific plaque bilateral.  Normal sized popliteal arteries bilateral without aneurysm.  This exam reveals normal perfusion of the right lower extremity (ABI 1.00) and mildly decreased perfusion of the left lower extremity, noted at the post tibial artery level (ABI 0.92).  No significant change from 11/20/2015.  EKG:  EKG 02/26/2020: Sinus bradycardia at rate of 54 bpm with first-degree AV block, normal axis, poor R wave progression, cannot  exclude anteroseptal infarct old.  IVCD, borderline criteria for LVH.   ABNORMAL no significant change from 06/26/2019.  EKG 12/05/2018: Atrial fibrillation with rapid ventricular response at the rate of 111 bpm, normal axis, IVCD, LVH.  Normal QT interval.  No evidence of ischemia. No significant change from  EKG 10/04/2017: Atrial fibrillation with controlled response at the rate of 66 bpm.   Assessment     ICD-10-CM   1. Coronary artery disease involving native coronary artery of native heart without angina pectoris  I25.10 EKG 12-Lead  2. Paroxysmal atrial fibrillation (HCC) CHA2DS2-VASc Score is 2.  Yearly risk of stroke: 2.3%  I48.0   3. S/P mitral valve repair  Z98.890   4. Essential hypertension  I10   5. S/P AAA repair using straight graft  Z98.890    Z86.79   6. SBE (subacute bacterial endocarditis) prophylaxis candidate  Z29.8   7. Amaurosis fugax of left eye  G45.3 PCV CAROTID DUPLEX (BILATERAL)    aspirin (ASPIRIN CHILDRENS) 81 MG chewable tablet  8. Tobacco use  Z72.0 buPROPion (WELLBUTRIN SR) 150 MG 12 hr tablet    nicotine (NICODERM CQ - DOSED IN MG/24 HOURS) 21 mg/24hr patch    nicotine (NICODERM CQ) 14 mg/24hr patch    Meds ordered this encounter  Medications  . buPROPion (WELLBUTRIN SR) 150 MG 12 hr tablet    Sig: Take 1 tablet (150 mg total) by mouth 2 (two) times daily at  10 am and 4 pm.    Dispense:  60 tablet    Refill:  4  . nicotine (NICODERM CQ - DOSED IN MG/24 HOURS) 21 mg/24hr patch    Sig: Place 1 patch (21 mg total) onto the skin daily.    Dispense:  28 patch    Refill:  0  . aspirin (ASPIRIN CHILDRENS) 81 MG chewable tablet    Sig: Chew 1 tablet (81 mg total) by mouth daily.  . nicotine (NICODERM CQ) 14 mg/24hr patch    Sig: Place 1 patch (14 mg total) onto the skin daily.    Dispense:  28 patch    Refill:  0   There are no discontinued medications.   Recommendations:   Earl Martin is a 59 y.o. Caucasian male with CAD, severe MR  noted in June 2018, hypertension, hyperlipidemia, atrial fibrillation and chronic back pain due to spinal stenosis. Earl Martin also has abdominal aortic aneurysm status post repair on 11/29/2014 by placement of stent graft by Dr. Annamarie Major.  H/O mitral valve repair and single-vessel coronary bypass surgery,  maze procedure and left atrial appendage clipping performed on 05/18/2018.   Earl Martin is maintaining sinus rhythm since cardioversion on 01/17/2019 and is presently on amiodarone. Reviewed his labs from his PCP.  Labs are stable with stable renal function and also stable CBC and LFTs. Needs TSH.  Earl Martin is tolerating anticoagulation without any bleeding complications.  However Earl Martin has been complaining of symptoms suggestive of TIA/amaurosis fugax especially involving left eye.  I started him on aspirin 81 mg daily along with patient being on Xarelto.  Will obtain carotid artery duplex.  Blood pressure is well controlled, no clinical evidence of heart failure. Earl Martin is aware that Earl Martin needs endocarditis prophylaxis.   Earl Martin also needs surveillance abdominal aortic duplex, Earl Martin did not keep up the appointment with Dr. Trula Slade.  Earl Martin wants me to continue the surveillance.  I will also obtain this and have like to see him back in 3 to 4 weeks for follow-up.  For the first time Earl Martin is asking help with smoking cessation.  We will start him on Wellbutrin 150 mg twice daily, also start him on nicotine patches.  40-minute office visit encounter with complex medical issues and making complex clinical decisions.   Adrian Prows, MD, [facility] 02/26/2020, 9:21 AM Office: [phone]

## 2020-02-26 NOTE — Patient Instructions (Signed)
Wellbutrin is a new drug that I have started for you for smoking cessation.  Start with 1 pill once a day in the morning.  After 3 days you can start taking it twice a day, 1 in the morning and 1 x 3 or 4 PM.  You may notice some change in your taste.  We will also start with aspirin 81 mg, chewable variety once a day.  You have been set up for ultrasound of the abdomen and also your carotid arteries and I will see you back after the test.  Bring lab reports from Dr. Ludwig Clarks.  Please ask him to check TSH.  This is thyroid function.

## 2020-03-11 ENCOUNTER — Other Ambulatory Visit: Payer: BLUE CROSS/BLUE SHIELD

## 2020-03-12 ENCOUNTER — Other Ambulatory Visit: Payer: Self-pay

## 2020-03-12 ENCOUNTER — Other Ambulatory Visit: Payer: Self-pay | Admitting: Cardiology

## 2020-03-12 ENCOUNTER — Ambulatory Visit: Payer: BLUE CROSS/BLUE SHIELD

## 2020-03-12 ENCOUNTER — Telehealth: Payer: Self-pay

## 2020-03-12 DIAGNOSIS — Z8679 Personal history of other diseases of the circulatory system: Secondary | ICD-10-CM

## 2020-03-12 DIAGNOSIS — Z9889 Other specified postprocedural states: Secondary | ICD-10-CM

## 2020-03-12 DIAGNOSIS — G453 Amaurosis fugax: Secondary | ICD-10-CM

## 2020-03-12 DIAGNOSIS — I723 Aneurysm of iliac artery: Secondary | ICD-10-CM

## 2020-03-12 DIAGNOSIS — I6523 Occlusion and stenosis of bilateral carotid arteries: Secondary | ICD-10-CM

## 2020-03-12 NOTE — Telephone Encounter (Signed)
Patient's pharmacy called regarding patient's amiodarone you wrote for patient to take only 0.5 tablet but pharmacy states patient told them he is taking the entire tablet pharmacy just wants to clarify please advise.

## 2020-03-12 NOTE — Telephone Encounter (Signed)
Yes, 100 mg only not 200. I also sent him mychart message Earl Martin

## 2020-03-19 ENCOUNTER — Other Ambulatory Visit: Payer: Self-pay

## 2020-03-19 DIAGNOSIS — Z9889 Other specified postprocedural states: Secondary | ICD-10-CM

## 2020-03-19 MED ORDER — AMOXICILLIN 500 MG PO CAPS: 500.0000 mg | ORAL_CAPSULE | Freq: Three times a day (TID) | ORAL | 3 refills | Status: DC

## 2020-04-05 NOTE — Progress Notes (Signed)
Labs 03/30/2020:  Total cholesterol 165, triglycerides thirty-four, HDL seventy-eight, LDL eighty.  Non-HDL cholesterol eighty-seven.  TSH 1.78, normal.  Hb 15.1/HCT 44.3, platelets 216.  Sodium 132, potassium 5.5, BUN eight, creatinine 0.80, EGFR >60 mL.

## 2020-04-19 ENCOUNTER — Encounter: Payer: Self-pay | Admitting: Cardiology

## 2020-04-19 ENCOUNTER — Telehealth: Payer: Self-pay | Admitting: Cardiology

## 2020-04-19 ENCOUNTER — Other Ambulatory Visit: Payer: Self-pay

## 2020-04-19 ENCOUNTER — Ambulatory Visit: Payer: BLUE CROSS/BLUE SHIELD | Admitting: Cardiology

## 2020-04-19 VITALS — BP 130/67 | HR 54 | Resp 16 | Ht 72.0 in | Wt 185.0 lb

## 2020-04-19 DIAGNOSIS — I480 Paroxysmal atrial fibrillation: Secondary | ICD-10-CM

## 2020-04-19 NOTE — Progress Notes (Signed)
Primary Physician/Referring:  Jilda Panda, MD  Patient ID: Earl Martin, male    DOB: 1960/12/02, 59 y.o.   MRN: [id number]  Chief Complaint  Patient presents with  . Coronary Artery Disease  . Atrial Fibrillation  . Follow-up    HPI: [name]  is a 59 y.o. male "Earl" Martin with CAD, severe MR noted in June 2018, hypertension, hyperlipidemia, atrial fibrillation and chronic back pain due to spinal stenosis. He also has abdominal aortic aneurysm status post repair on 11/29/2014 by placement of stent graft by Dr. Annamarie Major.  H/O mitral valve repair and single-vessel coronary bypass surgery,  maze procedure and left atrial appendage clipping performed on 05/18/2018.   He is maintaining sinus rhythm since cardioversion on 01/17/2019.  He has had occasional brief palpitations.  He is tolerating anticoagulation without any bleeding complications. He is a cigarette smoker unfortunately. 1/2 PPD.  He has not had any chest pain, he has not used S/L NTG since last office visit 6 months ago.  No claudication.  On his last office visit 2 months ago, he had complained of symptoms suggestive of amaurosis fugax involving the left eye and also the right eye, I started him on aspirin along with Eliquis that he is on for atrial fibrillation, since then he has not had any further episodes of visual loss.  He is presently doing well and essentially remains asymptomatic except for mild symptoms of claudication involving his right calf.  Past Medical History:  Diagnosis Date  . Atrial fibrillation, persistent ([place])   . Complication of anesthesia    belligerant when awakes  . Coronary artery disease   . Dysrhythmia   . Headache   . Heart murmur   . Hypercholesterolemia   . Hypertension    essential  . Mitral regurgitation   . Myocardial infarction ([place]) 12/25/2010  . Pneumonia    hx  . S/P CABG x 1 05/18/2018   SVG to RPL, EVH via right thigh  . S/P Maze operation for atrial  fibrillation 05/18/2018   Complete bilateral atrial lesion set using bipolar radiofrequency and cryothermy ablation with clipping of LA appendage  . S/P mitral valve repair 05/18/2018   34 mm Sorin Memo 4D ring annuloplasty  . Sleep apnea    does not use cpap  . Spinal stenosis     Past Surgical History:  Procedure Laterality Date  . ABDOMINAL AORTIC ENDOVASCULAR STENT GRAFT N/A 11/29/2014   Procedure: ABDOMINAL AORTIC ENDOVASCULAR STENT GRAFT;  Surgeon: Serafina Mitchell, MD;  Location: [facility];  Service: Vascular;  Laterality: N/A;  . CARDIAC CATHETERIZATION    . CARDIOVERSION N/A 09/20/2012   Procedure: CARDIOVERSION;  Surgeon: Laverda Page, MD;  Location: [facility];  Service: Cardiovascular;  Laterality: N/A;  . CARDIOVERSION N/A 01/17/2019   Procedure: CARDIOVERSION;  Surgeon: Adrian Prows, MD;  Location: [facility];  Service: Cardiovascular;  Laterality: N/A;  . CORONARY ARTERY BYPASS GRAFT N/A 05/18/2018   Procedure: CORONARY ARTERY BYPASS GRAFTING (CABG), ON PUMP, TIMES ONE, USING ENDOSCOPICALLY HARVESTED RIGHT GREATER SAPHENOUS VEIN;  Surgeon: Rexene Alberts, MD;  Location: [facility];  Service: Open Heart Surgery;  Laterality: N/A;  . CORONARY STENT PLACEMENT  12/25/2010   RCA  . CORONARY STENT PLACEMENT  12/26/2010   LAD  . HAND NERVE GRAFT    . INGUINAL HERNIA REPAIR Left 03/07/2013   Procedure: LEFT INGUINAL HERNIA REPAIR;  Surgeon: Rolm Bookbinder, MD;  Location: [facility];  Service: General;  Laterality:  Left;  . INSERTION OF MESH Left 03/07/2013   Procedure: INSERTION OF MESH;  Surgeon: Rolm Bookbinder, MD;  Location: [facility];  Service: General;  Laterality: Left;  Marland Kitchen MAZE N/A 05/18/2018   Procedure: MAZE;  Surgeon: Rexene Alberts, MD;  Location: [facility];  Service: Open Heart Surgery;  Laterality: N/A;  . MITRAL VALVE REPAIR N/A 05/18/2018   Procedure: MITRAL VALVE REPAIR (MVR);  Surgeon: Rexene Alberts, MD;  Location: [facility];  Service: Open Heart Surgery;  Laterality: N/A;  Using  Memo 4D Semirigid Annuloplasty Ring Size 34  . RIGHT/LEFT HEART CATH AND CORONARY ANGIOGRAPHY N/A 04/22/2018   Procedure: RIGHT/LEFT HEART CATH AND CORONARY ANGIOGRAPHY;  Surgeon: Adrian Prows, MD;  Location: [facility];  Service: Cardiovascular;  Laterality: N/A;  . ROTATOR CUFF REPAIR    . TEE WITHOUT CARDIOVERSION N/A 02/04/2016   Procedure: TRANSESOPHAGEAL ECHOCARDIOGRAM (TEE);  Surgeon: Adrian Prows, MD;  Location: [facility];  Service: Cardiovascular;  Laterality: N/A;  . TEE WITHOUT CARDIOVERSION N/A 04/22/2018   Procedure: TRANSESOPHAGEAL ECHOCARDIOGRAM (TEE);  Surgeon: Adrian Prows, MD;  Location: [facility];  Service: Cardiovascular;  Laterality: N/A;  . TEE WITHOUT CARDIOVERSION N/A 05/18/2018   Procedure: TRANSESOPHAGEAL ECHOCARDIOGRAM (TEE);  Surgeon: Rexene Alberts, MD;  Location: [facility];  Service: Open Heart Surgery;  Laterality: N/A;   Social History   Tobacco Use  . Smoking status: Current Every Day Smoker    Packs/day: 0.50    Years: 30.00    Pack years: 15.00    Types: Cigarettes  . Smokeless tobacco: Never Used  Substance Use Topics  . Alcohol use: Yes    Alcohol/week: 14.0 standard drinks    Types: 14 Cans of beer per week    Comment:  " 2-3 beers daily "   Marital Status: Divorced  Review of Systems  Cardiovascular: Positive for claudication (mild right calf) and palpitations. Negative for chest pain, dyspnea on exertion and leg swelling.  Musculoskeletal: Positive for back pain and joint pain.  Gastrointestinal: Negative for melena.   Objective  Blood pressure 130/67, pulse (!) 54, resp. rate 16, height 6' (1.829 m), weight 185 lb (83.9 kg), SpO2 99 %. Body mass index is 25.09 kg/m.  Vitals with BMI 04/19/2020 02/26/2020 06/26/2019  Height _0  _1  _2   Weight 185 lbs 190 lbs 179 lbs 5 oz  BMI 25.08 81.01 75.10  Systolic 258 527 782  Diastolic 67 82 72  Pulse 54 53 64    Physical Exam Constitutional:      General: He is not in acute distress.     Appearance: He is well-developed.  Eyes:     Conjunctiva/sclera: Conjunctivae normal.  Neck:     Thyroid: No thyromegaly.  Cardiovascular:     Rate and Rhythm: Regular rhythm.     Pulses: Intact distal pulses.          Carotid pulses are 2+ on the right side and 2+ on the left side.      Femoral pulses are 2+ on the right side with bruit and 2+ on the left side with bruit.      Popliteal pulses are 2+ on the right side and 2+ on the left side.       Dorsalis pedis pulses are 0 on the right side and 1+ on the left side.       Posterior tibial pulses are 1+ on the right side and 1+ on the left side.  Heart sounds: Normal heart sounds and S1 normal. No murmur heard.  No gallop. No S3 or S4 sounds.      Comments: No JVD or leg edema.  Bilateral varicose veins noted Pulmonary:     Effort: Pulmonary effort is normal.     Breath sounds: Normal breath sounds.  Abdominal:     General: Bowel sounds are normal.     Palpations: Abdomen is soft.  Musculoskeletal:        General: Normal range of motion.  Skin:    General: Skin is warm and dry.    Radiology: No results found.  Laboratory examination:    CMP Latest Ref Rng & Units 01/10/2019 05/26/2018 05/24/2018  Glucose 65 - 99 mg/dL 89 114(H) 106(H)  BUN 6 - 24 mg/dL _0 Creatinine 0.76 - 1.27 mg/dL 0.69(L) 0.74 0.54(L)  Sodium 134 - 144 mmol/L 137 131(L) 131(L)  Potassium 3.5 - 5.2 mmol/L 4.8 4.5 4.2  Chloride 96 - 106 mmol/L 99 94(L) 93(L)  CO2 20 - 29 mmol/L _1 Calcium 8.7 - 10.2 mg/dL 9.3 8.8(L) 8.6(L)  Total Protein 6.5 - 8.1 g/dL - - -  Total Bilirubin 0.3 - 1.2 mg/dL - - -  Alkaline Phos 38 - 126 U/L - - -  AST 15 - 41 U/L - - -  ALT 0 - 44 U/L - - -   CBC Latest Ref Rng & Units 05/24/2018 05/23/2018 05/22/2018  WBC 4.0 - 10.5 K/uL 6.4 7.9 8.9  Hemoglobin 13.0 - 17.0 g/dL 10.7(L) 10.4(L) 12.3(L)  Hematocrit 39 - 52 % 32.7(L) 32.3(L) 37.6(L)  Platelets 150 - 400 K/uL 167 129(L) 116(L)   HEMOGLOBIN  A1C Lab Results  Component Value Date   HGBA1C 5.4 05/16/2018   MPG 108.28 05/16/2018   TSH No results for input(s): TSH in the last 8760 hours.  External Labs:  Labs 03/30/2020:  Total cholesterol 165, triglycerides 34, HDL 78, LDL 80  Non-HDL cholesterol 87.  TSH 1.78, normal.  Hb 15.1/HCT 44.3, platelets 216.  Sodium 132, potassium 5.5, BUN eight, creatinine 0.80, EGFR >60 mL.  Labs 03/30/2019:  Hb 14.0/HCT 42.0, platelets 262.  Sodium 133, potassium 4.8, BUN 9, creatinine 0.74, EGFR greater than 60 mL.  CMP normal.  Total cholesterol 159, triglycerides 48, HDL 72, LDL 77.  Non-HDL cholesterol 87.  Labs 03/30/2019: HB 14.0/HCT 42.0, platelets 262.  Potassium 5.8, BUN 9, creatinine 0.74, eGFR >60 ML.    Total cholesterol 159, triglycerides 48, HDL 72, LDL 77.  PSA normal.  Medications   Current Outpatient Medications on File Prior to Visit  Medication Sig Dispense Refill  . acetaminophen (TYLENOL) 500 MG tablet Take 1,000 mg by mouth every 8 (eight) hours as needed for moderate pain.    Marland Kitchen amiodarone (PACERONE) 200 MG tablet Take 0.5 tablets (100 mg total) by mouth daily. 45 tablet 3  . amoxicillin (AMOXIL) 500 MG capsule Take 1 capsule (500 mg total) by mouth 3 (three) times daily. 20 capsule 3  . Ascorbic Acid (VITAMIN C) 1000 MG tablet Take 1,000 mg by mouth daily.    Marland Kitchen aspirin (ASPIRIN CHILDRENS) 81 MG chewable tablet Chew 1 tablet (81 mg total) by mouth daily.    Marland Kitchen buPROPion (WELLBUTRIN SR) 150 MG 12 hr tablet Take 1 tablet (150 mg total) by mouth 2 (two) times daily at 10 am and 4 pm. 60 tablet 4  . lisinopril (ZESTRIL) 20 MG tablet Take 1 tablet (20 mg total) by mouth  daily. 90 tablet 3  . metoprolol succinate (TOPROL-XL) 50 MG 24 hr tablet TAKE 1 TABLET DAILY, TAKE WITH OR FOLLOWING A MEAL. 90 tablet 0  . Multiple Vitamin (MULTIVITAMIN) capsule Take 1 capsule by mouth daily.    . nicotine (NICODERM CQ - DOSED IN MG/24 HOURS) 21 mg/24hr patch Place 1 patch  (21 mg total) onto the skin daily. 28 patch 0  . nicotine (NICODERM CQ) 14 mg/24hr patch Place 1 patch (14 mg total) onto the skin daily. 28 patch 0  . nitroGLYCERIN (NITROSTAT) 0.4 MG SL tablet Place 1 tablet (0.4 mg total) under the tongue every 5 (five) minutes as needed for chest pain. 25 tablet 2  . XARELTO 20 MG TABS tablet TAKE 1 TABLET ONCE DAILY IN THE EVENING AFTER DINNER. 90 tablet 0  . zolpidem (AMBIEN) 10 MG tablet Take 10 mg by mouth at bedtime as needed.    Marland Kitchen atorvastatin (LIPITOR) 40 MG tablet Take 40 mg by mouth at bedtime.     No current facility-administered medications on file prior to visit.     Cardiac Studies:   Coronary angiogram 04/22/2018: Patent stents placed for acute inferior MI on 12/23/2010 S/P mid RCA non drug eluting stent implantation, staged proximal LAD DES stent 12/26/2010. PL branch of RCA chronically occluded with collaterals from LAD. Mid LAD distal to stent 50% stenosis. Mild pulmonary hypertension.  CABG and MV Repair and Maze and LAA clipping 05/18/2018: SVG to RCA, Sorin Memo 4D annuloplasty 34 mm ring by Lilly Cove, MD  Echocardiogram 06/30/2018: Left ventricle cavity is normal in size. Moderate concentric hypertrophy of the left ventricle. Mild decrease in global wall motion. Visual EF is 45-50%. Calculated EF 50%. Left atrial cavity is severely dilated. S/p mitral valve repair with mild eccentric residual mitral regurgitation seen. No significant mitral stenosis. Mild tricuspid regurgitation.  No evidence of pulmonary hypertension. Compared to previous study on 03/29/2018, mitral valve repair is new.  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 Patent endovascular aneurysm repair with no evidence of endoleak. 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  Direct current cardioversion: 01/17/2019 Procedure: Using 120 mg of IV Propofol and 80 mg IV  Lidocaine (for reducing venous pain) for achieving deep sedation, synchronized direct current cardioversion performed. Patient was delivered with 120 Joules of electricity X 1 with success to NSR. Patient tolerated the procedure well. No immediate complication noted.  Lower Extremity Arterial Duplex 08/16/2019:  No hemodynamically significant stenoses are identified in the lower extremity arterial system. Moderate diffuse calcific plaque bilateral.  Normal sized popliteal arteries bilateral without aneurysm.  This exam reveals normal perfusion of the right lower extremity (ABI 1.00) and mildly decreased perfusion of the left lower extremity, noted at the post tibial artery level (ABI 0.92).  No significant change from 11/20/2015.  Carotid artery duplex  03/12/2020: Stenosis in the right internal carotid artery (50-69%). >50% stenosis in right carotid bulb with heavy calcific plaque Stenosis in the left internal carotid artery (16-49%). Antegrade right vertebral artery flow. Antegrade left vertebral artery flow. Follow up in six months is appropriate if clinically indicated.  Abdominal Aortic Duplex  03/12/2020: Moderate dilatation of the abdominal aorta is noted in the mid aorta. An abdominal aortic aneurysm measuring 4.28 x 4.51 x 4.28 cm is seen which is excluded by the endograft without e/o endoleak. 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 Normal iliac artery velocity bilaterally.  Heavy calcific plaque throughout the native abdominal aorta and iliac arteries.  No significant change from 03/12/2019. Recheck in 1 year.  EKG:  EKG 04/19/2020: Marked sinus bradycardia at rate of 52 bpm, left atrial enlargement, normal axis.  Poor R wave progression, cannot exclude anteroseptal infarct old.  IVCD, borderline criteria for LVH.  No significant change from 02/26/2020 and 06/26/2019.  EKG 12/05/2018: Atrial fibrillation with rapid ventricular response at the rate of 111 bpm,  normal axis, IVCD, LVH.  Normal QT interval.  No evidence of ischemia. No significant change from  EKG 10/04/2017: Atrial fibrillation with controlled response at the rate of 66 bpm.  Assessment     ICD-10-CM   1. Paroxysmal atrial fibrillation ([facility])  I48.0 EKG 12-Lead    No orders of the defined types were placed in this encounter.   Medications Discontinued During This Encounter  Medication Reason  . lovastatin (MEVACOR) 40 MG tablet Change in therapy    Recommendations:   Kriston "Earl" Preziosi is a 59 y.o. Caucasian male with CAD, severe MR noted in June 2018, hypertension, hyperlipidemia, atrial fibrillation and chronic back pain due to spinal stenosis. He also has abdominal aortic aneurysm status post repair on 11/29/2014 by placement of stent graft by Dr. Annamarie Major.  H/O mitral valve repair and single-vessel coronary bypass surgery,  maze procedure and left atrial appendage clipping performed on 05/18/2018.   He is maintaining sinus rhythm since cardioversion on 01/17/2019 and is presently on amiodarone 100 mg daily. Reviewed his labs from his PCP.  Labs are stable with stable renal function and also stable CBC and LFTs. Needs TSH.  He is tolerating anticoagulation without any bleeding complications.  On his last office visit due to symptoms suggestive of amaurosis, I started him back on aspirin 81 mg daily which he is tolerating and he has not had any further episodes.  I reviewed his carotid artery duplex, he is asymptomatic bilateral moderate carotid artery disease, I will continue 6 monthly surveillance for now.   I also reviewed his abdominal aortic duplex, AAA repair is stable.  I will request Dr. Trula Slade to see whether he would want to take over his management and surveillance of AAA.  Otherwise from cardiac standpoint he remains well compensated without recurrence of angina, no heart failure signs and has maintained sinus rhythm.  I will see him back in 6 months.   Adrian Prows, MD, [facility] 04/19/2020, 10:20 AM Office: [phone]

## 2020-05-13 NOTE — Telephone Encounter (Signed)
Message signed off

## 2020-05-30 ENCOUNTER — Other Ambulatory Visit: Payer: Self-pay | Admitting: *Deleted

## 2020-05-30 ENCOUNTER — Other Ambulatory Visit: Payer: Self-pay | Admitting: Cardiology

## 2020-05-30 DIAGNOSIS — I4821 Permanent atrial fibrillation: Secondary | ICD-10-CM

## 2020-05-30 DIAGNOSIS — I714 Abdominal aortic aneurysm, without rupture, unspecified: Secondary | ICD-10-CM

## 2020-06-17 ENCOUNTER — Encounter: Payer: Self-pay | Admitting: Surgery

## 2020-06-17 ENCOUNTER — Other Ambulatory Visit: Payer: Self-pay

## 2020-06-17 ENCOUNTER — Ambulatory Visit (HOSPITAL_COMMUNITY)
Admission: RE | Admit: 2020-06-17 | Discharge: 2020-06-17 | Disposition: A | Discharge status: Home / Self Care | Payer: BLUE CROSS/BLUE SHIELD | Source: Ambulatory Visit | Attending: Surgery | Admitting: Surgery

## 2020-06-17 ENCOUNTER — Ambulatory Visit: Payer: BLUE CROSS/BLUE SHIELD | Admitting: Surgery

## 2020-06-17 VITALS — BP 100/68 | HR 50 | Temp 97.9°F | Resp 20 | Ht 72.0 in | Wt 182.0 lb

## 2020-06-17 DIAGNOSIS — I714 Abdominal aortic aneurysm, without rupture, unspecified: Secondary | ICD-10-CM

## 2020-06-17 NOTE — Progress Notes (Signed)
V[facility] and [facility]in Specialist of   Patient name: Earl Martin MRN: [id number] DOB: February 25, 1961 Sex: male   REASON FOR VISIT:    Follow up  HISOTRY OF PRESENT ILLNESS:    Earl Martin a 59y.o.malewho is status post endovascular repair of a 6.3 cm infrarenal abdominal aortic aneurysm on 11/29/2014. At his initial postoperative visit, he had a type II endoleak, however at his 6 month follow-up, this had resolved and the aneurysm sac had decreased down to 6.0 cm  Lower extremity and carotid Doppler studies in June 2016 were unremarkable. He is on Xarelto for atrial fibrillation. He is medically managed for hypertension. He continues to live in [place].  He denies any abdominal pain or symptoms of claudication.  He is walking every day without issues.    On 05-18-2018, he underwent mitral valve repair, MAZE procedure, and CABG   PAST MEDICAL HISTORY:   Past Medical History:  Diagnosis Date  . Atrial fibrillation, persistent ([facility])   . Complication of anesthesia    belligerant when awakes  . Coronary artery disease   . Dysrhythmia   . Headache   . Heart murmur   . Hypercholesterolemia   . Hypertension    essential  . Mitral regurgitation   . Myocardial infarction ([facility]) 12/25/2010  . Pneumonia    hx  . S/P CABG x 1 05/18/2018   SVG to RPL, EVH via right thigh  . S/P Maze operation for atrial fibrillation 05/18/2018   Complete bilateral atrial lesion set using bipolar radiofrequency and cryothermy ablation with clipping of LA appendage  . S/P mitral valve repair 05/18/2018   34 mm Sorin Memo 4D ring annuloplasty  . Sleep apnea    does not use cpap  . Spinal stenosis      FAMILY HISTORY:   Family History  Problem Relation Age of Onset  . Heart failure Father   . Cancer Father   . Cancer Sister   . Diabetes Sister     SOCIAL HISTORY:   Social History   Tobacco Use  . Smoking status: Current Every  Day Smoker    Packs/day: 0.50    Years: 30.00    Pack years: 15.00    Types: Cigarettes  . Smokeless tobacco: Never Used  Substance Use Topics  . Alcohol use: Yes    Alcohol/week: 14.0 standard drinks    Types: 14 Cans of beer per week    Comment:  " 2-3 beers daily "     ALLERGIES:   No Known Allergies   CURRENT MEDICATIONS:   Current Outpatient Medications  Medication Sig Dispense Refill  . acetaminophen (TYLENOL) 500 MG tablet Take 1,000 mg by mouth every 8 (eight) hours as needed for moderate pain.    Marland Kitchen amiodarone (PACERONE) 200 MG tablet Take 0.5 tablets (100 mg total) by mouth daily. 45 tablet 3  . amoxicillin (AMOXIL) 500 MG capsule Take 1 capsule (500 mg total) by mouth 3 (three) times daily. 20 capsule 3  . Ascorbic Acid (VITAMIN C) 1000 MG tablet Take 1,000 mg by mouth daily.    Marland Kitchen aspirin (ASPIRIN CHILDRENS) 81 MG chewable tablet Chew 1 tablet (81 mg total) by mouth daily.    Marland Kitchen atorvastatin (LIPITOR) 40 MG tablet Take 40 mg by mouth at bedtime.    Marland Kitchen buPROPion (WELLBUTRIN SR) 150 MG 12 hr tablet Take 1 tablet (150 mg total) by mouth 2 (two) times daily at 10 am and 4 pm. 60 tablet 4  .  lisinopril (ZESTRIL) 20 MG tablet Take 1 tablet (20 mg total) by mouth daily. 90 tablet 3  . metoprolol succinate (TOPROL-XL) 50 MG 24 hr tablet TAKE 1 TABLET DAILY, TAKE WITH OR FOLLOWING A MEAL. 90 tablet 0  . Multiple Vitamin (MULTIVITAMIN) capsule Take 1 capsule by mouth daily.    . nicotine (NICODERM CQ - DOSED IN MG/24 HOURS) 21 mg/24hr patch Place 1 patch (21 mg total) onto the skin daily. 28 patch 0  . nicotine (NICODERM CQ) 14 mg/24hr patch Place 1 patch (14 mg total) onto the skin daily. 28 patch 0  . nitroGLYCERIN (NITROSTAT) 0.4 MG SL tablet Place 1 tablet (0.4 mg total) under the tongue every 5 (five) minutes as needed for chest pain. 25 tablet 2  . XARELTO 20 MG TABS tablet TAKE 1 TABLET ONCE DAILY IN THE EVENING AFTER DINNER. 90 tablet 0  . zolpidem (AMBIEN) 10 MG tablet  Take 10 mg by mouth at bedtime as needed.     No current facility-administered medications for this visit.    REVIEW OF SYSTEMS:   [X]  denotes positive finding, [ ]  denotes negative finding Cardiac  Comments:  Chest pain or chest pressure:    Shortness of breath upon exertion:    Short of breath when lying flat:    Irregular heart rhythm:        Vascular    Pain in calf, thigh, or hip brought on by ambulation:    Pain in feet at night that wakes you up from your sleep:     Blood clot in your veins:    Leg swelling:         Pulmonary    Oxygen at home:    Productive cough:     Wheezing:         Neurologic    Sudden weakness in arms or legs:     Sudden numbness in arms or legs:     Sudden onset of difficulty speaking or slurred speech:    Temporary loss of vision in one eye:     Problems with dizziness:         Gastrointestinal    Blood in stool:     Vomited blood:         Genitourinary    Burning when urinating:     Blood in urine:        Psychiatric    Major depression:         Hematologic    Bleeding problems:    Problems with blood clotting too easily:        Skin    Rashes or ulcers:        Constitutional    Fever or chills:      PHYSICAL EXAM:   Vitals:   06/17/20 0939  BP: 100/68  Pulse: (!) 50  Resp: 20  Temp: 97.9 F (36.6 C)  Weight: 182 lb (82.6 kg)  Height: 6' (1.829 m)    GENERAL: The patient is a well-nourished male, in no acute distress. The vital signs are documented above. PULMONARY: Non-labored respirations ABDOMEN: Soft and non-tender with normal pitched bowel sounds.  MUSCULOSKELETAL: There are no major deformities or cyanosis. NEUROLOGIC: No focal weakness or paresthesias are detected. SKIN: There are no ulcers or rashes noted. PSYCHIATRIC: The patient has a normal affect.  STUDIES:   I have reviewed the following studies: Abdominal Aorta: Patent endovascular aneurysm repair with no evidence of  endoleak.   Maximum  diameter:  4.5  cm  MEDICAL ISSUES:   Plan for follow up AAA duplex in 1 year    Leia Alf, MD, FACS [facility] [phone] Pager [phone]

## 2020-06-27 ENCOUNTER — Other Ambulatory Visit: Payer: Self-pay | Admitting: Cardiology

## 2020-06-27 DIAGNOSIS — I4821 Permanent atrial fibrillation: Secondary | ICD-10-CM

## 2020-07-31 ENCOUNTER — Other Ambulatory Visit: Payer: Self-pay | Admitting: Cardiology

## 2020-07-31 DIAGNOSIS — I480 Paroxysmal atrial fibrillation: Secondary | ICD-10-CM

## 2020-07-31 DIAGNOSIS — Z720 Tobacco use: Secondary | ICD-10-CM

## 2020-09-01 ENCOUNTER — Other Ambulatory Visit: Payer: Self-pay | Admitting: Cardiology

## 2020-09-01 DIAGNOSIS — I4821 Permanent atrial fibrillation: Secondary | ICD-10-CM

## 2020-09-26 ENCOUNTER — Other Ambulatory Visit: Payer: Self-pay | Admitting: Cardiology

## 2020-09-26 DIAGNOSIS — I4821 Permanent atrial fibrillation: Secondary | ICD-10-CM

## 2020-10-11 ENCOUNTER — Other Ambulatory Visit: Payer: Self-pay

## 2020-10-11 ENCOUNTER — Ambulatory Visit: Payer: BLUE CROSS/BLUE SHIELD

## 2020-10-11 DIAGNOSIS — Z8679 Personal history of other diseases of the circulatory system: Secondary | ICD-10-CM

## 2020-10-11 DIAGNOSIS — I6523 Occlusion and stenosis of bilateral carotid arteries: Secondary | ICD-10-CM

## 2020-10-11 DIAGNOSIS — Z9889 Other specified postprocedural states: Secondary | ICD-10-CM

## 2020-10-11 DIAGNOSIS — G453 Amaurosis fugax: Secondary | ICD-10-CM

## 2020-10-11 DIAGNOSIS — I723 Aneurysm of iliac artery: Secondary | ICD-10-CM

## 2020-10-18 ENCOUNTER — Ambulatory Visit: Payer: BLUE CROSS/BLUE SHIELD | Admitting: Cardiology

## 2020-11-25 ENCOUNTER — Other Ambulatory Visit: Payer: Self-pay | Admitting: Cardiology

## 2020-11-25 ENCOUNTER — Ambulatory Visit: Payer: BLUE CROSS/BLUE SHIELD | Admitting: Cardiology

## 2020-11-25 ENCOUNTER — Encounter: Payer: Self-pay | Admitting: Cardiology

## 2020-11-25 ENCOUNTER — Other Ambulatory Visit: Payer: Self-pay

## 2020-11-25 VITALS — BP 118/74 | HR 56 | Temp 98.0°F | Resp 17 | Ht 72.0 in | Wt 179.4 lb

## 2020-11-25 DIAGNOSIS — I480 Paroxysmal atrial fibrillation: Secondary | ICD-10-CM

## 2020-11-25 DIAGNOSIS — Z720 Tobacco use: Secondary | ICD-10-CM

## 2020-11-25 DIAGNOSIS — I2510 Atherosclerotic heart disease of native coronary artery without angina pectoris: Secondary | ICD-10-CM

## 2020-11-25 DIAGNOSIS — Z9889 Other specified postprocedural states: Secondary | ICD-10-CM

## 2020-11-25 DIAGNOSIS — I714 Abdominal aortic aneurysm, without rupture, unspecified: Secondary | ICD-10-CM

## 2020-11-25 DIAGNOSIS — Z8679 Personal history of other diseases of the circulatory system: Secondary | ICD-10-CM

## 2020-11-25 DIAGNOSIS — I4821 Permanent atrial fibrillation: Secondary | ICD-10-CM

## 2020-11-25 DIAGNOSIS — I6523 Occlusion and stenosis of bilateral carotid arteries: Secondary | ICD-10-CM

## 2020-11-25 MED ORDER — AMIODARONE HCL 200 MG PO TABS: 100.0000 mg | ORAL_TABLET | Freq: Every day | ORAL | 3 refills | Status: DC

## 2020-11-25 NOTE — Progress Notes (Signed)
Primary Physician/Referring:  Jilda Panda, MD  Patient ID: Earl Martin, male    DOB: [birth year], 60 y.o.   MRN: [id number]  Chief Complaint  Patient presents with   Coronary Artery Disease   Atrial Fibrillation   carotid stenosis    6 month     HPI: Earl Martin  is a 60 y.o. male "Earl" Martin with CAD, severe MR noted in June 2018,  hypertension, hyperlipidemia, atrial fibrillation and chronic back pain due to spinal stenosis.  He also has abdominal aortic aneurysm status post repair on 11/29/2014 by placement of stent graft by Dr. Annamarie Major.  H/O mitral valve repair and single-vessel coronary bypass surgery,  maze procedure and left atrial appendage clipping performed on 05/18/2018.   He is maintaining sinus rhythm since cardioversion on 01/17/2019.  He is tolerating anticoagulation without any bleeding complications. He is a cigarette smoker unfortunately. 1/2 PPD.  He has not had any chest pain, he has not used S/L NTG since last office visit 6 months ago.  No claudication.    He is presently doing well and essentially remains asymptomatic.  Past Medical History:  Diagnosis Date   Atrial fibrillation, persistent ([place])    Complication of anesthesia    belligerant when awakes   Coronary artery disease    Dysrhythmia    Headache    Heart murmur    Hypercholesterolemia    Hypertension    essential   Mitral regurgitation    Myocardial infarction ([place]) 12/25/2010   Pneumonia    hx   S/P CABG x 1 05/18/2018   SVG to RPL, EVH via right thigh   S/P Maze operation for atrial fibrillation 05/18/2018   Complete bilateral atrial lesion set using bipolar radiofrequency and cryothermy ablation with clipping of LA appendage   S/P mitral valve repair 05/18/2018   34 mm Sorin Memo 4D ring annuloplasty   Sleep apnea    does not use cpap   Spinal stenosis     Past Surgical History:  Procedure Laterality Date   ABDOMINAL AORTIC ENDOVASCULAR STENT GRAFT N/A 11/29/2014    Procedure: ABDOMINAL AORTIC ENDOVASCULAR STENT GRAFT;  Surgeon: Serafina Mitchell, MD;  Location: [place];  Service: Vascular;  Laterality: N/A;   CARDIAC CATHETERIZATION     CARDIOVERSION N/A 09/20/2012   Procedure: CARDIOVERSION;  Surgeon: Laverda Page, MD;  Location: [place];  Service: Cardiovascular;  Laterality: N/A;   CARDIOVERSION N/A 01/17/2019   Procedure: CARDIOVERSION;  Surgeon: Adrian Prows, MD;  Location: [place];  Service: Cardiovascular;  Laterality: N/A;   CORONARY ARTERY BYPASS GRAFT N/A 05/18/2018   Procedure: CORONARY ARTERY BYPASS GRAFTING (CABG), ON PUMP, TIMES ONE, USING ENDOSCOPICALLY HARVESTED RIGHT GREATER SAPHENOUS VEIN;  Surgeon: Rexene Alberts, MD;  Location: [place];  Service: Open Heart Surgery;  Laterality: N/A;   CORONARY STENT PLACEMENT  12/25/2010   RCA   CORONARY STENT PLACEMENT  12/26/2010   LAD   HAND NERVE GRAFT     INGUINAL HERNIA REPAIR Left 03/07/2013   Procedure: LEFT INGUINAL HERNIA REPAIR;  Surgeon: Rolm Bookbinder, MD;  Location: [place];  Service: General;  Laterality: Left;   INSERTION OF MESH Left 03/07/2013   Procedure: INSERTION OF MESH;  Surgeon: Rolm Bookbinder, MD;  Location: [place];  Service: General;  Laterality: Left;   MAZE N/A 05/18/2018   Procedure: MAZE;  Surgeon: Rexene Alberts, MD;  Location: [place];  Service: Open Heart Surgery;  Laterality: N/A;   MITRAL VALVE  REPAIR N/A 05/18/2018   Procedure: MITRAL VALVE REPAIR (MVR);  Surgeon: Rexene Alberts, MD;  Location: [place];  Service: Open Heart Surgery;  Laterality: N/A;  Using Memo 4D Semirigid Annuloplasty Ring Size 34   RIGHT/LEFT HEART CATH AND CORONARY ANGIOGRAPHY N/A 04/22/2018   Procedure: RIGHT/LEFT HEART CATH AND CORONARY ANGIOGRAPHY;  Surgeon: Adrian Prows, MD;  Location: [facility];  Service: Cardiovascular;  Laterality: N/A;   ROTATOR CUFF REPAIR     TEE WITHOUT CARDIOVERSION N/A 02/04/2016   Procedure: TRANSESOPHAGEAL ECHOCARDIOGRAM (TEE);  Surgeon: Adrian Prows, MD;   Location: [place];  Service: Cardiovascular;  Laterality: N/A;   TEE WITHOUT CARDIOVERSION N/A 04/22/2018   Procedure: TRANSESOPHAGEAL ECHOCARDIOGRAM (TEE);  Surgeon: Adrian Prows, MD;  Location: [place];  Service: Cardiovascular;  Laterality: N/A;   TEE WITHOUT CARDIOVERSION N/A 05/18/2018   Procedure: TRANSESOPHAGEAL ECHOCARDIOGRAM (TEE);  Surgeon: Rexene Alberts, MD;  Location: [place];  Service: Open Heart Surgery;  Laterality: N/A;   Social History   Tobacco Use   Smoking status: Every Day    Packs/day: 0.25    Years: 30.00    Pack years: 7.50    Types: Cigarettes   Smokeless tobacco: Never  Substance Use Topics   Alcohol use: Yes    Alcohol/week: 14.0 standard drinks    Types: 14 Cans of beer per week    Comment:  " 2-3 beers daily "   Marital Status: Divorced  Review of Systems  Cardiovascular:  Positive for palpitations. Negative for chest pain, claudication, dyspnea on exertion and leg swelling.  Musculoskeletal:  Positive for back pain and joint pain.  Gastrointestinal:  Negative for melena.  Objective  Blood pressure 118/74, pulse (!) 56, temperature 98 F (36.7 C), temperature source Temporal, resp. rate 17, height 6' (1.829 m), weight 179 lb 6.4 oz (81.4 kg), SpO2 98 %. Body mass index is 24.33 kg/m.  Vitals with BMI 11/25/2020 06/17/2020 04/19/2020  Height 6' 0"  6' 0"  6' 0"   Weight 179 lbs 6 oz 182 lbs 185 lbs  BMI 24.33 22.29 79.89  Systolic 211 941 740  Diastolic 74 68 67  Pulse 56 50 54    Physical Exam Constitutional:      General: He is not in acute distress.    Appearance: He is well-developed.  Eyes:     Conjunctiva/sclera: Conjunctivae normal.  Neck:     Thyroid: No thyromegaly.  Cardiovascular:     Rate and Rhythm: Regular rhythm.     Pulses: Intact distal pulses.          Carotid pulses are 2+ on the right side and 2+ on the left side.      Femoral pulses are 2+ on the right side with bruit and 2+ on the left side with bruit.       Popliteal pulses are 2+ on the right side and 2+ on the left side.       Dorsalis pedis pulses are 0 on the right side and 1+ on the left side.       Posterior tibial pulses are 1+ on the right side and 1+ on the left side.     Heart sounds: Normal heart sounds and S1 normal. No murmur heard.   No gallop. No S3 or S4 sounds.     Comments: No JVD or leg edema.  Bilateral varicose veins noted Pulmonary:     Effort: Pulmonary effort is normal.     Breath sounds: Normal breath sounds.  Abdominal:     General: Bowel sounds are normal.     Palpations: Abdomen is soft.  Musculoskeletal:        General: Normal range of motion.  Skin:    General: Skin is warm and dry.   Radiology: No results found.  Laboratory examination:    CMP Latest Ref Rng & Units 01/10/2019 05/26/2018 05/24/2018  Glucose 65 - 99 mg/dL 89 114(H) 106(H)  BUN 6 - 24 mg/dL 8 8 10   Creatinine 0.76 - 1.27 mg/dL 0.69(L) 0.74 0.54(L)  Sodium 134 - 144 mmol/L 137 131(L) 131(L)  Potassium 3.5 - 5.2 mmol/L 4.8 4.5 4.2  Chloride 96 - 106 mmol/L 99 94(L) 93(L)  CO2 20 - 29 mmol/L 22 28 27   Calcium 8.7 - 10.2 mg/dL 9.3 8.8(L) 8.6(L)  Total Protein 6.5 - 8.1 g/dL - - -  Total Bilirubin 0.3 - 1.2 mg/dL - - -  Alkaline Phos 38 - 126 U/L - - -  AST 15 - 41 U/L - - -  ALT 0 - 44 U/L - - -   CBC Latest Ref Rng & Units 05/24/2018 05/23/2018 05/22/2018  WBC 4.0 - 10.5 K/uL 6.4 7.9 8.9  Hemoglobin 13.0 - 17.0 g/dL 10.7(L) 10.4(L) 12.3(L)  Hematocrit 39.0 - 52.0 % 32.7(L) 32.3(L) 37.6(L)  Platelets 150 - 400 K/uL 167 129(L) 116(L)   HEMOGLOBIN A1C Lab Results  Component Value Date   HGBA1C 5.4 05/16/2018   MPG 108.28 05/16/2018   TSH No results for input(s): TSH in the last 8760 hours.  External Labs:  Labs 03/30/2020:   Total cholesterol 165, triglycerides 34, HDL 78, LDL 80  Non-HDL cholesterol 87.   TSH 1.78, normal.   Hb 15.1/HCT 44.3, platelets 216.   Sodium 132, potassium 5.5, BUN eight, creatinine 0.80, EGFR  >60 mL.  Labs 03/30/2019:  Hb 14.0/HCT 42.0, platelets 262.  Sodium 133, potassium 4.8, BUN 9, creatinine 0.74, EGFR greater than 60 mL.  CMP normal.  Total cholesterol 159, triglycerides 48, HDL 72, LDL 77.  Non-HDL cholesterol 87.  Labs 03/30/2019: HB 14.0/HCT 42.0, platelets 262.  Potassium 5.8, BUN 9, creatinine 0.74, eGFR >60 ML.    Total cholesterol 159, triglycerides 48, HDL 72, LDL 77.  PSA normal.  Medications   Current Outpatient Medications on File Prior to Visit  Medication Sig Dispense Refill   amoxicillin (AMOXIL) 500 MG capsule Take 1 capsule (500 mg total) by mouth 3 (three) times daily. 20 capsule 3   Ascorbic Acid (VITAMIN C) 1000 MG tablet Take 1,000 mg by mouth daily.     atorvastatin (LIPITOR) 40 MG tablet Take 40 mg by mouth at bedtime.     Cyanocobalamin (VITAMIN B 12 PO) Take 1 tablet by mouth daily.     metoprolol succinate (TOPROL-XL) 50 MG 24 hr tablet TAKE 1 TABLET DAILY, TAKE WITH OR FOLLOWING A MEAL. 90 tablet 0   Multiple Vitamin (MULTIVITAMIN) capsule Take 1 capsule by mouth daily.     nicotine (NICODERM CQ) 14 mg/24hr patch Place 1 patch (14 mg total) onto the skin daily. 28 patch 0   nitroGLYCERIN (NITROSTAT) 0.4 MG SL tablet Place 1 tablet (0.4 mg total) under the tongue every 5 (five) minutes as needed for chest pain. 25 tablet 2   zolpidem (AMBIEN) 10 MG tablet Take 10 mg by mouth at bedtime as needed.     No current facility-administered medications on file prior to visit.    Medications after present encounter: Current Outpatient Medications  Medication Instructions  amiodarone (PACERONE) 100 mg, Oral, Daily, Do not take Sat and Sun   amoxicillin (AMOXIL) 500 mg, Oral, 3 times daily   atorvastatin (LIPITOR) 40 mg, Oral, Daily at bedtime   buPROPion (WELLBUTRIN SR) 150 MG 12 hr tablet TAKE 1 TABLET TWICE A DAY AT 10AM AND 4PM.   Cyanocobalamin (VITAMIN B 12 PO) 1 tablet, Oral, Daily   lisinopril (ZESTRIL) 20 MG tablet TAKE 1 TABLET ONCE  DAILY.   metoprolol succinate (TOPROL-XL) 50 MG 24 hr tablet TAKE 1 TABLET DAILY, TAKE WITH OR FOLLOWING A MEAL.   Multiple Vitamin (MULTIVITAMIN) capsule 1 capsule, Daily   nicotine (NICODERM CQ) 14 mg, Transdermal, Daily   nitroGLYCERIN (NITROSTAT) 0.4 mg, Sublingual, Every 5 min PRN   vitamin C 1,000 mg, Oral, Daily   XARELTO 20 MG TABS tablet TAKE ONE TABLET ONCE DAILY IN THE EVENING AFTER DINNER.   zolpidem (AMBIEN) 10 mg, Oral, At bedtime PRN     Cardiac Studies:   Coronary angiogram 04/22/2018: Patent stents placed for acute inferior MI on 12/23/2010 S/P mid RCA non drug eluting stent implantation, staged proximal LAD DES stent 12/26/2010. PL branch of RCA chronically occluded with collaterals from LAD. Mid LAD distal to stent 50% stenosis. Mild pulmonary hypertension.  CABG and MV Repair and Maze and LAA clipping 05/18/2018: SVG to RCA, Sorin Memo 4D annuloplasty 34 mm ring by Lilly Cove, MD  Echocardiogram 06/30/2018: Left ventricle cavity is normal in size. Moderate concentric hypertrophy of the left ventricle. Mild decrease in global wall motion. Visual EF is 45-50%. Calculated EF 50%. Left atrial cavity is severely dilated. S/p mitral valve repair with mild eccentric residual mitral regurgitation seen. No significant mitral stenosis. Mild tricuspid regurgitation.  No evidence of pulmonary hypertension. Compared to previous study on 03/29/2018, mitral valve repair is new.  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 Patent endovascular aneurysm repair with no evidence of endoleak. 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  Direct current cardioversion: 01/17/2019 Procedure: Using 120 mg of IV Propofol and 80 mg IV Lidocaine (for reducing venous pain) for achieving deep sedation, synchronized direct current cardioversion performed. Patient was delivered with 120 Joules of electricity X 1  with success to NSR. Patient tolerated the procedure well. No immediate complication noted.  Lower Extremity Arterial Duplex 08/16/2019:  No hemodynamically significant stenoses are identified in the lower extremity arterial system.  Moderate diffuse calcific plaque bilateral.  Normal sized popliteal arteries bilateral without aneurysm.  This exam reveals normal perfusion of the right lower extremity (ABI 1.00) and mildly decreased perfusion of the left lower extremity, noted at the post tibial artery level (ABI 0.92).  No significant change from 11/20/2015.  Abdominal Aortic Duplex 10/11/2020:  An abdominal aortic aneurysm measuring 2.51 x 2.53 x 2.55 cm is seen. in  the mid and distal aorta.  An abdominal aortic aneurysm measuring 4.57cm  is seen which is excluded by the endograft without e/o endoleak.  Normal iliac artery velocity ans size.  No significant change from 03/12/2020. Recheck in 1 year.  Carotid artery duplex 10/11/2020: Minimal stenosis in the right internal carotid artery. Stenosis in the right external carotid artery (<50%). Stenosis in the left internal carotid artery (16-49%). Stenosis in the left external carotid artery (<50%). Antegrade right vertebral artery flow. Compared to 03/12/2020, right ICA stenosis of 50-69% not present. There is also significant morphology improvement in right ICA by duplex. Follow up in one  year is appropriate if clinically indicated.  EKG:  EKG 11/25/2020: Sinus bradycardia at rate of 54 bpm, normal axis, no evidence of ischemia, otherwise normal EKG.  EKG 04/19/2020: Marked sinus bradycardia at rate of 52 bpm, left atrial enlargement, normal axis.  Poor R wave progression, cannot exclude anteroseptal infarct old.  IVCD, borderline criteria for LVH.  No significant change from 02/26/2020 and 06/26/2019.  EKG 12/05/2018: Atrial fibrillation with rapid ventricular response at the rate of 111 bpm, normal axis, IVCD, LVH.  Normal QT interval.  No  evidence of ischemia. No significant change from  EKG 10/04/2017: Atrial fibrillation with controlled response at the rate of 66 bpm.  Assessment     ICD-10-CM   1. Coronary artery disease involving native coronary artery of native heart without angina pectoris  I25.10 EKG 12-Lead    2. Bilateral carotid artery stenosis  I65.23     3. Paroxysmal atrial fibrillation (HCC)  I48.0     4. S/P mitral valve repair  Z98.890     5. S/P AAA repair using straight graft  Z98.890    Z86.79     6. Paroxysmal atrial fibrillation (HCC) CHA2DS2-VASc Score is 2.  Yearly risk of stroke: 2.3%  I48.0 amiodarone (PACERONE) 200 MG tablet      Meds ordered this encounter  Medications   amiodarone (PACERONE) 200 MG tablet    Sig: Take 0.5 tablets (100 mg total) by mouth daily. Do not take Sat and Sun    Dispense:  45 tablet    Refill:  3     Medications Discontinued During This Encounter  Medication Reason   acetaminophen (TYLENOL) 500 MG tablet Error   nicotine (NICODERM CQ - DOSED IN MG/24 HOURS) 21 mg/24hr patch Error   amiodarone (PACERONE) 200 MG tablet    aspirin (ASPIRIN CHILDRENS) 81 MG chewable tablet Discontinued by provider     Recommendations:   Gwyndolyn Saxon "Earl" Denardo is a 60 y.o. Caucasian male with CAD, severe MR noted in June 2018,  hypertension, hyperlipidemia, atrial fibrillation and chronic back pain due to spinal stenosis.  He also has abdominal aortic aneurysm status post repair on 11/29/2014 by placement of stent graft by Dr. Annamarie Major.  H/O mitral valve repair and single-vessel coronary bypass surgery,  maze procedure and left atrial appendage clipping performed on 05/18/2018.   He is maintaining sinus rhythm since cardioversion on 01/17/2019 and is presently on amiodarone 100 mg daily. Reviewed his labs from his PCP.  Labs are stable with stable renal function and also stable CBC and LFTs.  Due to patient's young age, I would like to reduce the dose of amiodarone to the lowest  possible dose, advised him to hold off taking the amiodarone on Saturday and Sunday.  He is maintaining sinus rhythm.  His CHA2DS2-VASc risk score is very low, could certainly consider discontinuing Xarelto if he continues to maintain sinus rhythm by his next office visit.  For now I will discontinue aspirin as he has been stable from CAD standpoint to reduce the risk of bleeding.   He has not had any further visual disturbances suggestive of amaurosis fugax, carotid artery duplex reveals mild disease, will recheck in a year.  Otherwise from cardiac standpoint he remains well compensated without recurrence of angina, no heart failure signs.  Abdominal aortic duplex reviewed, aneurysm repair is stable.  We will recheck his aortic duplex in a year.  As he has maintained rhythm and as he continues to do well, I will see  him back on an annual basis.      Adrian Prows, MD, [facility] 11/25/2020, 9:49 PM Office: [phone]

## 2020-12-25 ENCOUNTER — Other Ambulatory Visit: Payer: Self-pay | Admitting: Cardiology

## 2020-12-25 DIAGNOSIS — I4821 Permanent atrial fibrillation: Secondary | ICD-10-CM

## 2021-02-22 ENCOUNTER — Other Ambulatory Visit: Payer: Self-pay | Admitting: Cardiology

## 2021-03-17 ENCOUNTER — Encounter: Payer: Self-pay | Admitting: Cardiology

## 2021-03-22 ENCOUNTER — Other Ambulatory Visit: Payer: Self-pay | Admitting: Cardiology

## 2021-03-22 DIAGNOSIS — I4821 Permanent atrial fibrillation: Secondary | ICD-10-CM

## 2021-04-01 ENCOUNTER — Encounter: Payer: Self-pay | Admitting: Cardiology

## 2021-04-21 ENCOUNTER — Other Ambulatory Visit: Payer: Self-pay | Admitting: Cardiology

## 2021-04-21 DIAGNOSIS — Z720 Tobacco use: Secondary | ICD-10-CM

## 2021-06-12 ENCOUNTER — Other Ambulatory Visit: Payer: Self-pay | Admitting: Cardiology

## 2021-06-12 DIAGNOSIS — I4821 Permanent atrial fibrillation: Secondary | ICD-10-CM

## 2021-06-23 ENCOUNTER — Other Ambulatory Visit: Payer: Self-pay

## 2021-06-23 DIAGNOSIS — I480 Paroxysmal atrial fibrillation: Secondary | ICD-10-CM

## 2021-06-23 MED ORDER — AMIODARONE HCL 200 MG PO TABS: 100.0000 mg | ORAL_TABLET | Freq: Every day | ORAL | 0 refills | Status: DC

## 2021-07-25 ENCOUNTER — Other Ambulatory Visit: Payer: Self-pay

## 2021-07-25 ENCOUNTER — Telehealth: Payer: Self-pay | Admitting: Cardiology

## 2021-07-25 DIAGNOSIS — Z720 Tobacco use: Secondary | ICD-10-CM

## 2021-07-25 MED ORDER — NICOTINE 14 MG/24HR TD PT24: 14.0000 mg | MEDICATED_PATCH | Freq: Every day | TRANSDERMAL | 0 refills | Status: DC

## 2021-07-25 NOTE — Telephone Encounter (Signed)
Ok to refill nicotine patches? Please advise.

## 2021-07-25 NOTE — Telephone Encounter (Signed)
Patient says pharmacy is waiting to hear back from our office on a prescription for nicotine patches, and they haven't heard anything yet. Patient needs refill. [facility] in [place] is now preferred pharmacy.

## 2021-07-25 NOTE — Telephone Encounter (Signed)
Yes

## 2021-08-08 ENCOUNTER — Other Ambulatory Visit: Payer: Self-pay

## 2021-08-08 DIAGNOSIS — I714 Abdominal aortic aneurysm, without rupture, unspecified: Secondary | ICD-10-CM

## 2021-08-11 ENCOUNTER — Ambulatory Visit: Payer: BLUE CROSS/BLUE SHIELD | Admitting: Surgery

## 2021-08-11 ENCOUNTER — Other Ambulatory Visit: Payer: Self-pay

## 2021-08-11 ENCOUNTER — Ambulatory Visit (HOSPITAL_COMMUNITY)
Admission: RE | Admit: 2021-08-11 | Discharge: 2021-08-11 | Disposition: A | Discharge status: Home / Self Care | Payer: BLUE CROSS/BLUE SHIELD | Source: Ambulatory Visit | Attending: Surgery | Admitting: Surgery

## 2021-08-11 ENCOUNTER — Encounter: Payer: Self-pay | Admitting: Surgery

## 2021-08-11 VITALS — BP 125/81 | HR 52 | Temp 98.0°F | Resp 20 | Ht 72.0 in | Wt 175.0 lb

## 2021-08-11 DIAGNOSIS — I7143 Infrarenal abdominal aortic aneurysm, without rupture: Secondary | ICD-10-CM

## 2021-08-11 DIAGNOSIS — I714 Abdominal aortic aneurysm, without rupture, unspecified: Secondary | ICD-10-CM | POA: Diagnosis not present

## 2021-08-11 DIAGNOSIS — I70211 Atherosclerosis of native arteries of extremities with intermittent claudication, right leg: Secondary | ICD-10-CM

## 2021-08-11 NOTE — Progress Notes (Signed)
[facility] [place]  Patient name: Earl Martin MRN: [id number] DOB: 04-Sep-1960 Sex: male   REASON FOR VISIT:    Follow up  HISOTRY OF PRESENT ILLNESS:      Earl Martin is a 61 y.o. male who is status post endovascular repair of a 6.3 cm infrarenal abdominal aortic aneurysm on 11/29/2014.  At his initial postoperative visit, he had a type II endoleak, however at his 6 month follow-up, this had resolved and the aneurysm sac had decreased down to 6.0 cm.  His last ultrasound 1 year ago showed a maximum aortic diameter 4.5 cm.  While in [place], this past October he developed severe claudication and ended up having a iliac stent and femoral endarterectomy with bovine patch angioplasty performed to treat his claudication which is dramatically better.   Lower extremity and carotid Doppler studies in June 2016 were unremarkable.  He is on Xarelto for atrial fibrillation.  He is medically managed for hypertension.  He continues to live in [place].   He denies any abdominal pain or symptoms of claudication.  He is walking every day without issues.     On 05-18-2018, he underwent mitral valve repair, MAZE procedure, and CABG     PAST MEDICAL HISTORY:   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/2018   SVG to RPL, EVH via right thigh   S/P Maze operation for atrial fibrillation 05/18/2018   Complete bilateral atrial lesion set using bipolar radiofrequency and cryothermy ablation with clipping of LA appendage   S/P mitral valve repair 05/18/2018   34 mm Sorin Memo 4D  ring annuloplasty   Sleep apnea    does not use cpap   Spinal stenosis      FAMILY HISTORY:   Family History  Problem Relation Age of Onset   Heart failure Father    Cancer Father    Cancer Sister    Diabetes Sister     SOCIAL HISTORY:   Social History   Tobacco Use   Smoking status: Every Day    Packs/day: 0.50    Years: 30.00    Pack years: 15.00    Types: Cigarettes   Smokeless tobacco: Never  Substance Use Topics   Alcohol use: Yes    Alcohol/week: 14.0 standard drinks    Types: 14 Cans of beer per week    Comment:  " 2-3 beers daily "     ALLERGIES:   No Known Allergies   CURRENT MEDICATIONS:   Current Outpatient Medications  Medication Sig Dispense Refill   amiodarone (PACERONE) 200 MG tablet Take 0.5 tablets (100 mg total) by mouth daily. 30 tablet 0   amoxicillin (AMOXIL) 500 MG capsule Take 1 capsule (500 mg total) by mouth 3 (three) times daily. (Patient taking differently: Take 500 mg by mouth 3 (three) times daily. Prior to dental work) 20 capsule 3   Ascorbic Acid (VITAMIN C) 1000 MG tablet Take 1,000 mg by mouth daily.     atorvastatin (LIPITOR) 40 MG tablet Take 40 mg by mouth at bedtime.  buPROPion (WELLBUTRIN SR) 150 MG 12 hr tablet TAKE 1 TABLET TWICE A DAY AT 10AM AND 4PM. 60 tablet 3   Cyanocobalamin (VITAMIN B 12 PO) Take 1 tablet by mouth daily.     metoprolol succinate (TOPROL-XL) 50 MG 24 hr tablet TAKE ONE TABLET DAILY WITH OR IMMEDIATELY FOLLOWING A MEAL 90 tablet 0   Multiple Vitamin (MULTIVITAMIN) capsule Take 1 capsule by mouth daily.     nicotine (NICODERM CQ - DOSED IN MG/24 HOURS) 21 mg/24hr patch APPLY ONE PATCH DAILY 28 patch 1   nitroGLYCERIN (NITROSTAT) 0.4 MG SL tablet Place 1 tablet (0.4 mg total) under the tongue every 5 (five) minutes as needed for chest pain. 25 tablet 2   XARELTO 20 MG TABS tablet TAKE ONE TABLET ONCE DAILY IN THE EVENING AFTER DINNER. 90 tablet 3   zolpidem (AMBIEN) 10 MG tablet Take 10 mg by mouth  at bedtime as needed.     No current facility-administered medications for this visit.    REVIEW OF SYSTEMS:   [X]  denotes positive finding, [ ]  denotes negative finding Cardiac  Comments:  Chest pain or chest pressure:    Shortness of breath upon exertion:    Short of breath when lying flat:    Irregular heart rhythm:        Vascular    Pain in calf, thigh, or hip brought on by ambulation:    Pain in feet at night that wakes you up from your sleep:     Blood clot in your veins:    Leg swelling:         Pulmonary    Oxygen at home:    Productive cough:     Wheezing:         Neurologic    Sudden weakness in arms or legs:     Sudden numbness in arms or legs:     Sudden onset of difficulty speaking or slurred speech:    Temporary loss of vision in one eye:     Problems with dizziness:         Gastrointestinal    Blood in stool:     Vomited blood:         Genitourinary    Burning when urinating:     Blood in urine:        Psychiatric    Major depression:         Hematologic    Bleeding problems:    Problems with blood clotting too easily:        Skin    Rashes or ulcers:        Constitutional    Fever or chills:      PHYSICAL EXAM:   Vitals:   08/11/21 0953  BP: 125/81  Pulse: (!) 52  Resp: 20  Temp: 98 F (36.7 C)  SpO2: 98%  Weight: 175 lb (79.4 kg)  Height: 6' (1.829 m)    GENERAL: The patient is a well-nourished male, in no acute distress. The vital signs are documented above. CARDIAC: There is a regular rate and rhythm.  VASCULAR: Palpable femoral pulses bilaterally PULMONARY: Non-labored respirations ABDOMEN: Soft and non-tender.  MUSCULOSKELETAL: There are no major deformities or cyanosis. NEUROLOGIC: No focal weakness or paresthesias are detected. SKIN: There are no ulcers or rashes noted. PSYCHIATRIC: The patient has a normal affect.  STUDIES:   I have reviewed his ultrasound with the following findings: Abdominal Aorta: Patent  endovascular aneurysm repair with no evidence of  endoleak. Residual sac appears to be larger than perviously documented,  however there is no evidence of endoleak. The largest aortic diameter has  increased compared to prior exam.  Previous diameter measurement was 4.6 cm obtained on 10/11/2020.      MEDICAL ISSUES:   AAA: Maximum aortic diameter is 5.4 cm.  There is no evidence of endoleak.  I will follow-up his ultrasound in 1 year  Claudication: His symptoms appear to be dramatically improved since his endarterectomy and iliac stenting.  I will image this when he returns.    Charlena Cross, MD, FACS [facility] [phone] Pager [phone]

## 2021-08-16 ENCOUNTER — Other Ambulatory Visit: Payer: Self-pay | Admitting: Cardiology

## 2021-08-16 DIAGNOSIS — Z720 Tobacco use: Secondary | ICD-10-CM

## 2021-08-17 ENCOUNTER — Other Ambulatory Visit: Payer: Self-pay | Admitting: Cardiology

## 2021-08-17 DIAGNOSIS — I480 Paroxysmal atrial fibrillation: Secondary | ICD-10-CM

## 2021-08-18 NOTE — Telephone Encounter (Signed)
Refill request

## 2021-08-21 ENCOUNTER — Other Ambulatory Visit: Payer: Self-pay | Admitting: Cardiology

## 2021-08-21 DIAGNOSIS — Z720 Tobacco use: Secondary | ICD-10-CM

## 2021-08-26 ENCOUNTER — Other Ambulatory Visit: Payer: Self-pay | Admitting: Cardiology

## 2021-08-26 DIAGNOSIS — Z720 Tobacco use: Secondary | ICD-10-CM

## 2021-08-28 ENCOUNTER — Other Ambulatory Visit: Payer: Self-pay | Admitting: Cardiology

## 2021-08-29 ENCOUNTER — Telehealth: Payer: Self-pay | Admitting: Cardiology

## 2021-08-29 ENCOUNTER — Other Ambulatory Visit: Payer: Self-pay

## 2021-08-29 MED ORDER — NICOTINE 21 MG/24HR TD PT24: MEDICATED_PATCH | TRANSDERMAL | 1 refills | Status: DC

## 2021-08-29 NOTE — Telephone Encounter (Signed)
Is he requesting this looks like the Rx is not appropriate. If he wants, yes rx

## 2021-08-29 NOTE — Telephone Encounter (Signed)
Pt req a call back for refill on rx ?

## 2021-08-29 NOTE — Telephone Encounter (Signed)
Tried calling patient to see what medications he needs refilled no answer left a vm to call back

## 2021-08-29 NOTE — Telephone Encounter (Signed)
Patient called back he need his nicotine patch refilled it has been refilled and patient is aware

## 2021-08-29 NOTE — Telephone Encounter (Signed)
Has been refilled

## 2021-08-29 NOTE — Telephone Encounter (Signed)
From patient.

## 2021-09-01 ENCOUNTER — Other Ambulatory Visit: Payer: Self-pay

## 2021-09-01 NOTE — Telephone Encounter (Signed)
Medication has been refilled spoke to pharmacy to confirm

## 2021-09-12 ENCOUNTER — Other Ambulatory Visit: Payer: Self-pay | Admitting: Cardiology

## 2021-09-12 DIAGNOSIS — Z720 Tobacco use: Secondary | ICD-10-CM

## 2021-09-12 DIAGNOSIS — F411 Generalized anxiety disorder: Secondary | ICD-10-CM

## 2021-09-12 NOTE — Telephone Encounter (Signed)
Generalized anxiety disorder - Plan: buPROPion (WELLBUTRIN SR) 150 MG 12 hr tablet ? ?Tobacco use - Plan: buPROPion (WELLBUTRIN SR) 150 MG 12 hr tablet ? ? ?

## 2021-09-12 NOTE — Telephone Encounter (Signed)
Dx code needed by pharmacy.

## 2021-09-22 ENCOUNTER — Other Ambulatory Visit: Payer: Self-pay

## 2021-09-22 DIAGNOSIS — Z9889 Other specified postprocedural states: Secondary | ICD-10-CM

## 2021-09-22 MED ORDER — AMOXICILLIN 500 MG PO CAPS: 500.0000 mg | ORAL_CAPSULE | Freq: Three times a day (TID) | ORAL | 3 refills | Status: DC

## 2021-09-30 ENCOUNTER — Telehealth: Payer: Self-pay | Admitting: Cardiology

## 2021-09-30 ENCOUNTER — Other Ambulatory Visit: Payer: Self-pay

## 2021-09-30 DIAGNOSIS — I4821 Permanent atrial fibrillation: Secondary | ICD-10-CM

## 2021-09-30 MED ORDER — METOPROLOL SUCCINATE ER 50 MG PO TB24: ORAL_TABLET | ORAL | 0 refills | Status: DC

## 2021-09-30 NOTE — Telephone Encounter (Signed)
Pt req ref for below meds ? ?metoprolol succinate (TOPROL-XL) 50 MG 24 hr tablet ? ?[id number] - [address], [place] - [address] AT [facility]  [phone] ?

## 2021-09-30 NOTE — Telephone Encounter (Signed)
Refill sent to pharmacy.   

## 2021-10-23 ENCOUNTER — Other Ambulatory Visit: Payer: Self-pay | Admitting: Cardiology

## 2021-11-16 ENCOUNTER — Other Ambulatory Visit: Payer: Self-pay | Admitting: Cardiology

## 2021-11-16 DIAGNOSIS — I4821 Permanent atrial fibrillation: Secondary | ICD-10-CM

## 2021-11-26 ENCOUNTER — Ambulatory Visit: Payer: BLUE CROSS/BLUE SHIELD | Admitting: Cardiology

## 2021-12-05 ENCOUNTER — Ambulatory Visit: Payer: BLUE CROSS/BLUE SHIELD | Admitting: Cardiology

## 2021-12-05 ENCOUNTER — Encounter: Payer: Self-pay | Admitting: Cardiology

## 2021-12-05 ENCOUNTER — Ambulatory Visit: Payer: BLUE CROSS/BLUE SHIELD

## 2021-12-05 VITALS — BP 136/79 | HR 58 | Temp 98.0°F | Resp 16 | Ht 72.0 in | Wt 175.2 lb

## 2021-12-05 DIAGNOSIS — I2510 Atherosclerotic heart disease of native coronary artery without angina pectoris: Secondary | ICD-10-CM

## 2021-12-05 DIAGNOSIS — I723 Aneurysm of iliac artery: Secondary | ICD-10-CM

## 2021-12-05 DIAGNOSIS — I6523 Occlusion and stenosis of bilateral carotid arteries: Secondary | ICD-10-CM

## 2021-12-05 DIAGNOSIS — Z8679 Personal history of other diseases of the circulatory system: Secondary | ICD-10-CM

## 2021-12-05 DIAGNOSIS — I480 Paroxysmal atrial fibrillation: Secondary | ICD-10-CM

## 2021-12-05 MED ORDER — AMIODARONE HCL 200 MG PO TABS: 100.0000 mg | ORAL_TABLET | Freq: Every day | ORAL | 3 refills | Status: DC

## 2021-12-05 NOTE — Progress Notes (Signed)
Primary Physician/Referring:  Ralene Ok, MD  Patient ID: Earl Martin, male    DOB: 1961/04/28, 61 y.o.   MRN: [id number]  Chief Complaint  Patient presents with   Atrial Fibrillation   Hypertension   Coronary Artery Disease   Follow-up    1 year     HPI: Earl Martin  is a 61 y.o. male "Earl" Martin with CAD, severe MR noted in June 2018,  hypertension, hyperlipidemia, atrial fibrillation and chronic back pain due to spinal stenosis.  He also has abdominal aortic aneurysm status post repair on 11/29/2014 by placement of stent graft by Dr. Durene Cal.  H/O mitral valve repair and single-vessel coronary bypass surgery,  maze procedure and left atrial appendage clipping performed on 05/18/2018.   He is maintaining sinus rhythm since cardioversion on 01/17/2019.  He is tolerating anticoagulation without any bleeding complications. He is a cigarette smoker unfortunately. 1/2 PPD, is presently wearing a nicotine patch and states that he is on the verge of quitting smoking.  He has not had any chest pain, he has not used S/L NTG since last office visit 6 months ago.  No claudication.    He underwent bilateral common iliac artery stenting in [place] by extension of endovascular aortic graft into both limbs and also right femoral endarterectomy and patch angioplasty on 04/03/2021.  Symptoms of claudication have improved remarkably since then except still has mild symptoms of claudication involving his bilateral calves right worse than the left.  He has not had any chest pain, palpitations, dizziness or syncope.  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/2018    SVG to RPL, EVH via right thigh   S/P Maze operation for atrial fibrillation 05/18/2018   Complete bilateral atrial lesion set using bipolar radiofrequency and cryothermy ablation with clipping of LA appendage   S/P mitral valve repair 05/18/2018   34 mm Sorin Memo 4D ring annuloplasty   Sleep apnea    does not use cpap   Spinal stenosis     Past Surgical History:  Procedure Laterality Date   ABDOMINAL AORTIC ENDOVASCULAR STENT GRAFT N/A 11/29/2014   Procedure: ABDOMINAL AORTIC ENDOVASCULAR STENT GRAFT;  Surgeon: Nada Libman, MD;  Location[facility];  Service: Vascular;  Laterality: N/A;   CARDIAC CATHETERIZATION     CARDIOVERSION N/A 09/20/2012   Procedure: CARDIOVERSION;  Surgeon: [name], MD;  Location: [facility] ENDOSCOPY;  Service: Cardiovascular;  Laterality: N/A;   CARDIOVERSION N/A 01/17/2019   Procedure: CARDIOVERSION;  Surgeon: Yates Decamp, MD;  Location: [facility] ENDOSCOPY;  Service: Cardiovascular;  Laterality: N/A;   CORONARY ARTERY BYPASS GRAFT N/A 05/18/2018   Procedure: CORONARY ARTERY BYPASS GRAFTING (CABG), ON PUMP, TIMES ONE, USING ENDOSCOPICALLY HARVESTED RIGHT GREATER SAPHENOUS VEIN;  Surgeon: Purcell Nails, MD;  Location: [facility] OR;  Service: Open Heart Surgery;  Laterality: N/A;   CORONARY STENT PLACEMENT  12/25/2010   RCA   CORONARY STENT PLACEMENT  12/26/2010   LAD   HAND NERVE GRAFT     INGUINAL HERNIA REPAIR Left 03/07/2013   Procedure: LEFT INGUINAL HERNIA  REPAIR;  Surgeon: Emelia Loron, MD;  [facility];  Service: General;  Laterality: Left;   INSERTION OF MESH Left 03/07/2013   Procedure: INSERTION OF MESH;  Surgeon: Emelia Loron, MD;  Location: [facility] OR;  Service: General;  Laterality: Left;   MAZE N/A 05/18/2018   Procedure: MAZE;  Surgeon: Purcell Nails, MD;  Location: [facility] OR;  Service: Open Heart Surgery;  Laterality: N/A;   MITRAL VALVE REPAIR N/A 05/18/2018   Procedure: MITRAL VALVE REPAIR (MVR);  Surgeon: Purcell Nails, MD;  Location: [facility] OR;   Service: Open Heart Surgery;  Laterality: N/A;  Using Memo 4D Semirigid Annuloplasty Ring Size 34   RIGHT/LEFT HEART CATH AND CORONARY ANGIOGRAPHY N/A 04/22/2018   Procedure: RIGHT/LEFT HEART CATH AND CORONARY ANGIOGRAPHY;  Surgeon: Yates Decamp, MD;  Location: [facility] INVASIVE CV LAB;  Service: Cardiovascular;  Laterality: N/A;   ROTATOR CUFF REPAIR     TEE WITHOUT CARDIOVERSION N/A 02/04/2016   Procedure: TRANSESOPHAGEAL ECHOCARDIOGRAM (TEE);  Surgeon: Yates Decamp, MD;  Location: [facility] ENDOSCOPY;  Service: Cardiovascular;  Laterality: N/A;   TEE WITHOUT CARDIOVERSION N/A 04/22/2018   Procedure: TRANSESOPHAGEAL ECHOCARDIOGRAM (TEE);  Surgeon: Yates Decamp, MD;  Location: [facility] ENDOSCOPY;  Service: Cardiovascular;  Laterality: N/A;   TEE WITHOUT CARDIOVERSION N/A 05/18/2018   Procedure: TRANSESOPHAGEAL ECHOCARDIOGRAM (TEE);  Surgeon: Purcell Nails, MD;  Location: [facility] OR;  Service: Open Heart Surgery;  Laterality: N/A;   Social History   Tobacco Use   Smoking status: Every Day    Packs/day: 0.50    Years: 30.00    Total pack years: 15.00    Types: Cigarettes   Smokeless tobacco: Never  Substance Use Topics   Alcohol use: Yes    Alcohol/week: 14.0 standard drinks of alcohol    Types: 14 Cans of beer per week    Comment:  " 2-3 beers daily "   Marital Status: Divorced  Review of Systems  Cardiovascular:  Positive for claudication and palpitations. Negative for chest pain, dyspnea on exertion and leg swelling.  Musculoskeletal:  Positive for back pain and joint pain.  Gastrointestinal:  Negative for melena.   Objective  Blood pressure 136/79, pulse (!) 58, temperature 98 F (36.7 C), temperature source Temporal, resp. rate 16, height 6' (1.829 m), weight 175 lb 3.2 oz (79.5 kg). Body mass index is 23.76 kg/m.     12/05/2021   10:00 AM 08/11/2021    9:53 AM 11/25/2020    8:38 AM  Vitals with BMI  Height 6\' 0"  6\' 0"  6\' 0"   Weight 175 lbs 3 oz 175 lbs 179 lbs 6 oz  BMI 23.76 23.73 24.33  Systolic  136 125 118  Diastolic 79 81 74  Pulse 58 52 56    Physical Exam Constitutional:      Appearance: He is well-developed.  Neck:     Thyroid: No thyromegaly.  Cardiovascular:     Rate and Rhythm: Regular rhythm. Bradycardia present.     Pulses: Intact distal pulses.          Carotid pulses are 2+ on the right side and 2+ on the left side.      Femoral pulses are 2+ on the right side with bruit and 2+ on the left side with bruit.      Popliteal pulses are 2+ on the right side and 2+ on the left side.       Dorsalis pedis pulses are 0 on the right side and 1+ on the left side.  Posterior tibial pulses are 1+ on the right side and 1+ on the left side.     Heart sounds: Normal heart sounds and S1 normal. No murmur heard.    No gallop. No S3 or S4 sounds.     Comments: No JVD or leg edema.  Bilateral varicose veins noted Pulmonary:     Effort: Pulmonary effort is normal.     Breath sounds: Normal breath sounds.  Abdominal:     General: Bowel sounds are normal.     Palpations: Abdomen is soft.  Musculoskeletal:     Right lower leg: No edema.     Left lower leg: No edema.  Skin:    Capillary Refill: Capillary refill takes less than 2 seconds.    Radiology: No results found.  Laboratory examination:  External labs:  Labs 11/04/2021:  Total cholesterol 157, triglycerides 44, HDL 77, LDL 67.  Serum glucose 90 mg, BUN 11, creatinine 0.76, EGFR 102 mL, potassium 5.1, LFTs normal.  Hb 14.1/HCT 42.5, platelets 295, normal indicis.   Medications after present encounter:  Current Outpatient Medications:    Ascorbic Acid (VITAMIN C) 1000 MG tablet, Take 1,000 mg by mouth daily., Disp: , Rfl:    atorvastatin (LIPITOR) 40 MG tablet, Take 40 mg by mouth at bedtime., Disp: , Rfl:    buPROPion (WELLBUTRIN SR) 150 MG 12 hr tablet, TAKE 1 TABLET BY MOUTH TWICE A DAY AT 10AM AND AGAIN AT 4PM., Disp: 180 tablet, Rfl: 2   Cyanocobalamin (VITAMIN B 12 PO), Take 1 tablet by mouth daily.,  Disp: , Rfl:    ezetimibe (ZETIA) 10 MG tablet, Take 10 mg by mouth daily., Disp: , Rfl:    metoprolol succinate (TOPROL-XL) 50 MG 24 hr tablet, TAKE ONE TABLET DAILY WITH OR IMMEDIATELY FOLLOWING A MEAL, Disp: 90 tablet, Rfl: 0   Multiple Vitamin (MULTIVITAMIN) capsule, Take 1 capsule by mouth daily., Disp: , Rfl:    nicotine ([facility] NICOTINE) 21 mg/24hr patch, APPLY ONE PATCH DAILY, Disp: 28 patch, Rfl: 1   nitroGLYCERIN (NITROSTAT) 0.4 MG SL tablet, Place 1 tablet (0.4 mg total) under the tongue every 5 (five) minutes as needed for chest pain., Disp: 25 tablet, Rfl: 2   XARELTO 20 MG TABS tablet, TAKE 1 TABLET BY MOUTH ONCE DAILY IN THE EVENING AFTER DINNER., Disp: 90 tablet, Rfl: 3   zolpidem (AMBIEN) 10 MG tablet, Take 10 mg by mouth at bedtime as needed., Disp: , Rfl:    amiodarone (PACERONE) 200 MG tablet, Take 0.5 tablets (100 mg total) by mouth daily. Hold Sat and Sunday, Disp: 90 tablet, Rfl: 3   Cardiac Studies:   Coronary angiogram 04/22/2018: Patent stents placed for acute inferior MI on 12/23/2010 S/P mid RCA non drug eluting stent implantation, staged proximal LAD DES stent 12/26/2010. PL branch of RCA chronically occluded with collaterals from LAD. Mid LAD distal to stent 50% stenosis. Mild pulmonary hypertension.  CABG and MV Repair and Maze and LAA clipping 05/18/2018: SVG to RCA, Sorin Memo 4D annuloplasty 34 mm ring by Ashley Mariner, MD  Echocardiogram 06/30/2018: Left ventricle cavity is normal in size. Moderate concentric hypertrophy of the left ventricle. Mild decrease in global wall motion. Visual EF is 45-50%. Calculated EF 50%. Left atrial cavity is severely dilated. S/p mitral valve repair with mild eccentric residual mitral regurgitation seen. No significant mitral stenosis. Mild tricuspid regurgitation.  No evidence of pulmonary hypertension. Compared to previous study on 03/29/2018, mitral valve repair is new.  Abdominal aortic duplex 03/06/2019:  Abdominal Aorta: There is  evidence of abnormal dilation of the Right Common Iliac artery. Patent endovascular aneurysm repair with no evidence of endoleak. 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  Direct current cardioversion: 01/17/2019 Procedure: Using 120 mg of IV Propofol and 80 mg IV Lidocaine (for reducing venous pain) for achieving deep sedation, synchronized direct current cardioversion performed. Patient was delivered with 120 Joules of electricity X 1 with success to NSR. Patient tolerated the procedure well. No immediate complication noted.  Lower Extremity Arterial Duplex 08/16/2019:  No hemodynamically significant stenoses are identified in the lower extremity arterial system.  Moderate diffuse calcific plaque bilateral.  Normal sized popliteal arteries bilateral without aneurysm.  This exam reveals normal perfusion of the right lower extremity (ABI 1.00) and mildly decreased perfusion of the left lower extremity, noted at the post tibial artery level (ABI 0.92).  No significant change from 11/20/2015.  Carotid artery duplex 10/11/2020: Minimal stenosis in the right internal carotid artery. Stenosis in the right external carotid artery (<50%). Stenosis in the left internal carotid artery (16-49%). Stenosis in the left external carotid artery (<50%). Antegrade right vertebral artery flow. Compared to 03/12/2020, right ICA stenosis of 50-69% not present. There is also significant morphology improvement in right ICA by duplex. Follow up in one year is appropriate if clinically indicated.  Peripheral angiogram 04/11/2021: Peripheral arterial disease with successful placement of gold VBX stent x2 into the right common and external iliac artery, extending the endoprosthesis from previous graft. Right femoral endarterectomy and bovine pericardial patch angioplasty on 04/11/2021 by Dr. Howie Ill in [place] for new onset claudication.  EKG:  EKG 12/05/2021: Marked sinus bradycardia at the rate of  45 bpm, normal axis, IVCD, borderline criteria for LVH.  No evidence of ischemia.  No significant change from 11/25/2020, heart rate previously was 54 bpm.  EKG 12/05/2018: Atrial fibrillation with rapid ventricular response at the rate of 111 bpm, normal axis, IVCD, LVH.  Normal QT interval.  No evidence of ischemia. No significant change from  EKG 10/04/2017: Atrial fibrillation with controlled response at the rate of 66 bpm.  Assessment     ICD-10-CM   1. Coronary artery disease involving native coronary artery of native heart without angina pectoris  I25.10 EKG 12-Lead    2. Bilateral carotid artery stenosis  I65.23     3. Paroxysmal atrial fibrillation (HCC)  I48.0     4. S/P AAA repair using straight graft  Z98.890    Z86.79     5. Paroxysmal atrial fibrillation (HCC) CHA2DS2-VASc Score is 2.  Yearly risk of stroke: 2.3%  I48.0 amiodarone (PACERONE) 200 MG tablet      CHA2DS2-VASc Score is 2.  Yearly risk of stroke: 2.3% (HTN, Vasc Dz).  Score of 1=0.6; 2=2.2; 3=3.2; 4=4.8; 5=7.2; 6=9.8; 7=>9.8) -(CHF; HTN; vasc disease DM,  Male = 1; Age <65 =0; 65-74 = 1,  >75 =2; stroke/embolism= 2).   Meds ordered this encounter  Medications   amiodarone (PACERONE) 200 MG tablet    Sig: Take 0.5 tablets (100 mg total) by mouth daily. Hold Sat and Sunday    Dispense:  90 tablet    Refill:  3     Medications Discontinued During This Encounter  Medication Reason   amoxicillin (AMOXIL) 500 MG capsule    amiodarone (PACERONE) 200 MG tablet      Recommendations:   Earl Martin is a 61 y.o. Caucasian male with CAD, severe MR noted in June 2018,  hypertension,  hyperlipidemia, atrial fibrillation and chronic back pain due to spinal stenosis.  He also has abdominal aortic aneurysm status post repair on 11/29/2014 by placement of stent graft by Dr. Durene Cal.  H/O mitral valve repair and single-vessel coronary bypass surgery,  maze procedure and left atrial appendage clipping  performed on 05/18/2018.   Due to severe symptoms of claudication, he underwent endovascular stenting of bilateral common iliac arteries extending the previously placed abdominal graft and open right femoral patch angioplasty on 04/11/2021.  Symptoms of claudication improved significantly.  I suspect he probably has small vessel disease and he is having symptoms of claudication in bilateral calves right worse than the left.  This can be managed medically, he has excellent femoral and popliteal pulses.  He has now established with vascular surgery in [place] where he lives.  He could certainly continue to follow-up with them with regard to vascular issues including abdominal aortic aneurysm surveillance.  He also needs carotid arteries to be scanned for asymptomatic mild left carotid stenosis.  As he lives in [place], I could ask the surgical colleagues.  To follow-up on this as well.  From cardiac standpoint he has not had any recurrence of angina pectoris, he is still maintaining sinus rhythm.  Due to marked sinus bradycardia, advised him to reduce the dose of the amiodarone from 100 mg daily to 5 days a week.  On his last office visit I was considering discontinuation of Xarelto as his chads vascular score is very low however he has been on warfarin remotely for many years and now on Xarelto without any bleeding diathesis, although low risk for stroke, I still believe that he probably would benefit from being on long-term Xarelto.  Unless a contraindication were to arise, she admission making was done today regarding continuation of anticoagulation.  I will see him back in a year or sooner if problems.  40-minute office visit encounter with reconciliation of external records and evaluation of external labs.   Yates Decamp, MD, [facility] 12/05/2021, 10:42 AM Office: [phone]

## 2021-12-18 ENCOUNTER — Other Ambulatory Visit: Payer: Self-pay | Admitting: Cardiology

## 2021-12-18 NOTE — Telephone Encounter (Signed)
Refill request

## 2021-12-27 ENCOUNTER — Other Ambulatory Visit: Payer: Self-pay | Admitting: Cardiology

## 2021-12-27 DIAGNOSIS — I4821 Permanent atrial fibrillation: Secondary | ICD-10-CM

## 2022-02-13 ENCOUNTER — Other Ambulatory Visit: Payer: Self-pay | Admitting: Cardiology

## 2022-02-13 DIAGNOSIS — I2510 Atherosclerotic heart disease of native coronary artery without angina pectoris: Secondary | ICD-10-CM

## 2022-02-13 DIAGNOSIS — Z720 Tobacco use: Secondary | ICD-10-CM

## 2022-02-13 MED ORDER — NICOTINE 7 MG/24HR TD PT24: 7.0000 mg | MEDICATED_PATCH | Freq: Every day | TRANSDERMAL | 2 refills | Status: DC

## 2022-02-13 NOTE — Telephone Encounter (Signed)
Medications Discontinued During This Encounter  Medication Reason   nicotine (NICODERM CQ - DOSED IN MG/24 HOURS) 21 mg/24hr patch     Meds ordered this encounter  Medications   nicotine (NICODERM CQ) 14 mg/24hr patch    Sig: Place 1 patch (14 mg total) onto the skin daily.    Dispense:  28 patch    Refill:  0   nicotine (NICODERM CQ) 7 mg/24hr patch    Sig: Place 1 patch (7 mg total) onto the skin daily. Once done with 14 mg Rx    Dispense:  28 patch    Refill:  2    Yates Decamp, MD, [facility] 02/13/2022, 2:32 PM Office: [phone] Fax: [phone] Pager: [phone]

## 2022-02-13 NOTE — Telephone Encounter (Signed)
Refill request

## 2022-03-27 ENCOUNTER — Other Ambulatory Visit: Payer: Self-pay | Admitting: Cardiology

## 2022-03-27 DIAGNOSIS — I2510 Atherosclerotic heart disease of native coronary artery without angina pectoris: Secondary | ICD-10-CM

## 2022-03-27 DIAGNOSIS — Z720 Tobacco use: Secondary | ICD-10-CM

## 2022-03-30 ENCOUNTER — Other Ambulatory Visit: Payer: Self-pay | Admitting: Cardiology

## 2022-03-30 DIAGNOSIS — I4821 Permanent atrial fibrillation: Secondary | ICD-10-CM

## 2022-04-25 ENCOUNTER — Other Ambulatory Visit: Payer: Self-pay | Admitting: Cardiology

## 2022-04-25 DIAGNOSIS — Z720 Tobacco use: Secondary | ICD-10-CM

## 2022-04-25 DIAGNOSIS — I2510 Atherosclerotic heart disease of native coronary artery without angina pectoris: Secondary | ICD-10-CM

## 2022-05-27 ENCOUNTER — Other Ambulatory Visit: Payer: Self-pay | Admitting: Cardiology

## 2022-05-27 DIAGNOSIS — I4821 Permanent atrial fibrillation: Secondary | ICD-10-CM

## 2022-05-28 ENCOUNTER — Other Ambulatory Visit: Payer: Self-pay | Admitting: Cardiology

## 2022-05-28 DIAGNOSIS — F411 Generalized anxiety disorder: Secondary | ICD-10-CM

## 2022-05-28 DIAGNOSIS — Z720 Tobacco use: Secondary | ICD-10-CM

## 2022-07-18 ENCOUNTER — Other Ambulatory Visit: Payer: Self-pay | Admitting: Cardiology

## 2022-07-18 DIAGNOSIS — I2510 Atherosclerotic heart disease of native coronary artery without angina pectoris: Secondary | ICD-10-CM

## 2022-07-18 DIAGNOSIS — Z720 Tobacco use: Secondary | ICD-10-CM

## 2022-08-17 ENCOUNTER — Telehealth: Payer: Self-pay

## 2022-08-17 NOTE — Telephone Encounter (Signed)
Patient called stating that he will be having oral surgery 1 week from tomorrow and needs Amoxicillin sent in and clearance form will be  faxed over today.

## 2022-08-18 ENCOUNTER — Telehealth: Payer: Self-pay | Admitting: Cardiology

## 2022-08-18 ENCOUNTER — Other Ambulatory Visit: Payer: Self-pay | Admitting: Cardiology

## 2022-08-18 DIAGNOSIS — Z9889 Other specified postprocedural states: Secondary | ICD-10-CM

## 2022-08-18 MED ORDER — AMOXICILLIN 500 MG PO CAPS: 2000.0000 mg | ORAL_CAPSULE | Freq: Once | ORAL | 3 refills | Status: DC | PRN

## 2022-08-18 NOTE — Progress Notes (Signed)
ICD-10-CM   1. S/P mitral valve repair  Z98.890 amoxicillin (AMOXIL) 500 MG capsule     Meds ordered this encounter  Medications   amoxicillin (AMOXIL) 500 MG capsule    Sig: Take 4 capsules (2,000 mg total) by mouth once as needed for up to 1 dose. One hour before dental procedure    Dispense:  4 capsule    Refill:  3

## 2022-08-18 NOTE — Telephone Encounter (Signed)
Did either one of you see this clearance form.

## 2022-08-18 NOTE — Telephone Encounter (Signed)
Patient says a clearance was faxed over to have oral surgery. They're waiting on [place] to fax this information over (xarelto, aspirin, how long to hold). Patient asked if someone can call him as well to tell him this information so he knows what's going on with his medication as well. Looks like clearance is going to Dr. Delman Cheadle in [place].

## 2022-08-18 NOTE — Telephone Encounter (Signed)
I DO NOT HAVE ANYTHING

## 2022-08-19 ENCOUNTER — Encounter: Payer: Self-pay | Admitting: Cardiology

## 2022-08-28 ENCOUNTER — Other Ambulatory Visit: Payer: Self-pay | Admitting: Cardiology

## 2022-08-28 DIAGNOSIS — I480 Paroxysmal atrial fibrillation: Secondary | ICD-10-CM

## 2022-08-28 NOTE — Telephone Encounter (Signed)
Can we refill? 

## 2022-09-01 ENCOUNTER — Other Ambulatory Visit: Payer: Self-pay | Admitting: Cardiology

## 2022-09-01 DIAGNOSIS — I4821 Permanent atrial fibrillation: Secondary | ICD-10-CM

## 2022-09-02 ENCOUNTER — Other Ambulatory Visit: Payer: Self-pay

## 2022-09-02 DIAGNOSIS — I4821 Permanent atrial fibrillation: Secondary | ICD-10-CM

## 2022-09-02 MED ORDER — METOPROLOL SUCCINATE ER 50 MG PO TB24: ORAL_TABLET | ORAL | 0 refills | Status: DC

## 2022-09-09 NOTE — Telephone Encounter (Signed)
Ganji sent 08/28/22

## 2022-10-13 ENCOUNTER — Other Ambulatory Visit: Payer: Self-pay

## 2022-10-13 DIAGNOSIS — Z9889 Other specified postprocedural states: Secondary | ICD-10-CM

## 2022-10-13 MED ORDER — AMOXICILLIN 500 MG PO CAPS: 2000.0000 mg | ORAL_CAPSULE | Freq: Once | ORAL | 0 refills | Status: DC | PRN

## 2022-10-13 NOTE — Telephone Encounter (Signed)
Patient having 4 different dental procedures needed antibiotic sent in for these procedures. One Rx sent in to have for upcoming scheduled procedures.

## 2022-10-15 ENCOUNTER — Other Ambulatory Visit: Payer: Self-pay | Admitting: Cardiology

## 2022-10-15 ENCOUNTER — Other Ambulatory Visit: Payer: Self-pay

## 2022-10-15 DIAGNOSIS — I2510 Atherosclerotic heart disease of native coronary artery without angina pectoris: Secondary | ICD-10-CM

## 2022-10-15 DIAGNOSIS — Z720 Tobacco use: Secondary | ICD-10-CM

## 2022-10-15 MED ORDER — NICOTINE 7 MG/24HR TD PT24: 7.0000 mg | MEDICATED_PATCH | Freq: Every day | TRANSDERMAL | 2 refills | Status: DC

## 2022-10-21 ENCOUNTER — Other Ambulatory Visit: Payer: Self-pay

## 2022-10-21 DIAGNOSIS — I2510 Atherosclerotic heart disease of native coronary artery without angina pectoris: Secondary | ICD-10-CM

## 2022-10-21 DIAGNOSIS — Z720 Tobacco use: Secondary | ICD-10-CM

## 2022-10-21 MED ORDER — NICOTINE 7 MG/24HR TD PT24: 7.0000 mg | MEDICATED_PATCH | Freq: Every day | TRANSDERMAL | 3 refills | Status: DC

## 2022-11-10 ENCOUNTER — Other Ambulatory Visit: Payer: Self-pay

## 2022-11-10 DIAGNOSIS — I7143 Infrarenal abdominal aortic aneurysm, without rupture: Secondary | ICD-10-CM

## 2022-11-10 DIAGNOSIS — I70211 Atherosclerosis of native arteries of extremities with intermittent claudication, right leg: Secondary | ICD-10-CM

## 2022-11-23 ENCOUNTER — Ambulatory Visit (INDEPENDENT_AMBULATORY_CARE_PROVIDER_SITE_OTHER)
Admission: RE | Admit: 2022-11-23 | Discharge: 2022-11-23 | Disposition: A | Discharge status: Home / Self Care | Payer: BLUE CROSS/BLUE SHIELD | Source: Ambulatory Visit | Attending: Surgery | Admitting: Surgery

## 2022-11-23 ENCOUNTER — Ambulatory Visit (HOSPITAL_COMMUNITY)
Admission: RE | Admit: 2022-11-23 | Discharge: 2022-11-23 | Disposition: A | Discharge status: Home / Self Care | Payer: BLUE CROSS/BLUE SHIELD | Source: Ambulatory Visit | Attending: Surgery | Admitting: Surgery

## 2022-11-23 ENCOUNTER — Encounter: Payer: Self-pay | Admitting: Surgery

## 2022-11-23 ENCOUNTER — Ambulatory Visit: Payer: BLUE CROSS/BLUE SHIELD | Admitting: Surgery

## 2022-11-23 VITALS — BP 130/91 | HR 89 | Temp 97.9°F | Resp 20 | Ht 72.0 in | Wt 179.0 lb

## 2022-11-23 DIAGNOSIS — I7143 Infrarenal abdominal aortic aneurysm, without rupture: Secondary | ICD-10-CM

## 2022-11-23 DIAGNOSIS — I70211 Atherosclerosis of native arteries of extremities with intermittent claudication, right leg: Secondary | ICD-10-CM | POA: Diagnosis not present

## 2022-11-23 DIAGNOSIS — I70213 Atherosclerosis of native arteries of extremities with intermittent claudication, bilateral legs: Secondary | ICD-10-CM | POA: Diagnosis not present

## 2022-11-23 LAB — VAS US ABI WITH/WO TBI
Left ABI: 0.99
Right ABI: 0.75

## 2022-11-23 NOTE — Progress Notes (Signed)
[facility] [place]  Patient name: Earl Martin MRN: [id number] DOB: 06/10/1961 Sex: male   REASON FOR VISIT:    Follow up  HISOTRY OF PRESENT ILLNESS:    Earl Martin is a 62 y.o. male who is status post endovascular repair of a 6.3 cm infrarenal abdominal aortic aneurysm on 11/29/2014.  At his initial postoperative visit, he had a type II endoleak, however at his 6 month follow-up, this had resolved and the aneurysm sac had decreased down to 6.0 cm.  His last ultrasound 2 years ago showed a maximum aortic diameter 4.5 cm.  Last year it measured 5.4 cm.   While in [place], this past October he developed severe claudication and ended up having a iliac stent and femoral endarterectomy with bovine patch angioplasty performed to treat his claudication..   Lower extremity and carotid Doppler studies in June 2016 were unremarkable.  He is on Xarelto for atrial fibrillation.  He is medically managed for hypertension.  He continues to live in [place].   He denies any abdominal pain or symptoms of claudication.  He is walking every day without issues.     On 05-18-2018, he underwent mitral valve repair, MAZE procedure, and CABG   PAST MEDICAL HISTORY:   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/2018   SVG to RPL, EVH via right thigh   S/P Maze operation for atrial fibrillation 05/18/2018   Complete bilateral atrial lesion set using bipolar radiofrequency and cryothermy ablation with clipping of LA appendage   S/P mitral valve repair 05/18/2018   34 mm Sorin Memo 4D ring annuloplasty   Sleep apnea     does not use cpap   Spinal stenosis      FAMILY HISTORY:   Family History  Problem Relation Age of Onset   Heart failure Father    Cancer Father    Cancer Sister    Diabetes Sister     SOCIAL HISTORY:   Social History   Tobacco Use   Smoking status: Every Day    Packs/day: 0.25    Years: 30.00    Additional pack years: 0.00    Total pack years: 7.50    Types: Cigarettes   Smokeless tobacco: Never   Tobacco comments:    3 cigarettes a day  Substance Use Topics   Alcohol use: Yes    Alcohol/week: 14.0 standard drinks of alcohol    Types: 14 Cans of beer per week    Comment:  " 2-3 beers daily "     ALLERGIES:   No Known Allergies   CURRENT MEDICATIONS:   Current Outpatient Medications  Medication Sig Dispense Refill   amiodarone (PACERONE) 200 MG tablet TAKE 1/2 TABLET BY MOUTH EVERY DAY 45 tablet 7   amoxicillin (AMOXIL) 500 MG capsule Take 4 capsules (2,000 mg total) by mouth once as needed for up to 1 dose. One hour before dental procedure 16 capsule 0   Ascorbic Acid (VITAMIN C) 1000 MG tablet Take 1,000 mg by mouth daily.     atorvastatin (LIPITOR)  40 MG tablet Take 40 mg by mouth at bedtime.     buPROPion (WELLBUTRIN SR) 150 MG 12 hr tablet TAKE 1 TABLET BY MOUTH TWICE A DAY AT 10AM AND AGAIN AT 4PM. 180 tablet 2   [employer] NICOTINE TRANSDERMAL SYS 14 MG/24HR patch APPLY 1 PATCH ONTO THE SKIN EVERY DAY 28 patch 2   Cyanocobalamin (VITAMIN B 12 PO) Take 1 tablet by mouth daily.     ezetimibe (ZETIA) 10 MG tablet Take 10 mg by mouth daily.     metoprolol succinate (TOPROL-XL) 50 MG 24 hr tablet Take with or immediately following a meal. 90 tablet 0   Multiple Vitamin (MULTIVITAMIN) capsule Take 1 capsule by mouth daily.     nicotine (NICODERM CQ) 7 mg/24hr patch Place 1 patch (7 mg total) onto the skin daily. Once done with 14 mg Rx 28 patch 3   nitroGLYCERIN (NITROSTAT) 0.4 MG SL tablet Place 1 tablet (0.4 mg total) under the tongue every 5 (five) minutes as  needed for chest pain. 25 tablet 2   XARELTO 20 MG TABS tablet TAKE 1 TABLET BY MOUTH ONCE DAILY IN THE EVENING AFTER DINNER. 90 tablet 3   zolpidem (AMBIEN) 10 MG tablet Take 10 mg by mouth at bedtime as needed.     No current facility-administered medications for this visit.    REVIEW OF SYSTEMS:   [X]  denotes positive finding, [ ]  denotes negative finding Cardiac  Comments:  Chest pain or chest pressure:    Shortness of breath upon exertion:    Short of breath when lying flat:    Irregular heart rhythm:        Vascular    Pain in calf, thigh, or hip brought on by ambulation:    Pain in feet at night that wakes you up from your sleep:     Blood clot in your veins:    Leg swelling:         Pulmonary    Oxygen at home:    Productive cough:     Wheezing:         Neurologic    Sudden weakness in arms or legs:     Sudden numbness in arms or legs:     Sudden onset of difficulty speaking or slurred speech:    Temporary loss of vision in one eye:     Problems with dizziness:         Gastrointestinal    Blood in stool:     Vomited blood:         Genitourinary    Burning when urinating:     Blood in urine:        Psychiatric    Major depression:         Hematologic    Bleeding problems:    Problems with blood clotting too easily:        Skin    Rashes or ulcers:        Constitutional    Fever or chills:      PHYSICAL EXAM:   Vitals:   11/23/22 1122  BP: (!) 130/91  Pulse: 89  Resp: 20  Temp: 97.9 F (36.6 C)  SpO2: 96%  Weight: 179 lb (81.2 kg)  Height: 6' (1.829 m)    GENERAL: The patient is a well-nourished male, in no acute distress. The vital signs are documented above. CARDIAC: There is a regular rate and rhythm. PULMONARY: Non-labored respirations ABDOMEN: Soft and non-tender  MUSCULOSKELETAL: There are  no major deformities or cyanosis. NEUROLOGIC: No focal weakness or paresthesias are detected. SKIN: There are no ulcers or rashes  noted. PSYCHIATRIC: The patient has a normal affect.  STUDIES:   I have reviewed the following:  EVAR: Abdominal Aorta: Patent endovascular aneurysm repair with no evidence of  endoleak. The largest aortic diameter remains essentially unchanged  compared to prior exam. Previous diameter measurement was 4.6 cm obtained  on 12/05/21 at [facility].   ABI/TBIToday's ABIToday's TBIPrevious ABIPrevious TBI  +-------+-----------+-----------+------------+------------+  Right 0.75       0.26       1                         +-------+-----------+-----------+------------+------------+  Left  0.99       0.41       0.92                      +-------+-----------+-----------+------------+------------+  Right: Occlusion vs near occlusion at the popliteal and TPT. Tibial  vessels reconstitute.    MEDICAL ISSUES:   AAA: Stable aneurysm with no endoleak.  He will follow-up in 1 year  Claudication: The patients are tolerable.  Ultrasound shows distal disease which I would not recommend intervention.  He will contact me if he develops a wound or change in symptoms.  Follow-up 1 year  Charlena Cross, MD, FACS [facility] [phone] Pager [phone]

## 2022-12-02 ENCOUNTER — Other Ambulatory Visit: Payer: Self-pay

## 2022-12-02 DIAGNOSIS — I70213 Atherosclerosis of native arteries of extremities with intermittent claudication, bilateral legs: Secondary | ICD-10-CM

## 2022-12-02 DIAGNOSIS — I7143 Infrarenal abdominal aortic aneurysm, without rupture: Secondary | ICD-10-CM

## 2022-12-07 ENCOUNTER — Encounter: Payer: Self-pay | Admitting: Cardiology

## 2022-12-07 ENCOUNTER — Ambulatory Visit: Payer: BLUE CROSS/BLUE SHIELD | Admitting: Cardiology

## 2022-12-07 VITALS — BP 130/80 | HR 90 | Ht 72.0 in | Wt 177.8 lb

## 2022-12-07 DIAGNOSIS — I70213 Atherosclerosis of native arteries of extremities with intermittent claudication, bilateral legs: Secondary | ICD-10-CM

## 2022-12-07 DIAGNOSIS — Z8679 Personal history of other diseases of the circulatory system: Secondary | ICD-10-CM

## 2022-12-07 DIAGNOSIS — I2510 Atherosclerotic heart disease of native coronary artery without angina pectoris: Secondary | ICD-10-CM

## 2022-12-07 DIAGNOSIS — Z720 Tobacco use: Secondary | ICD-10-CM

## 2022-12-07 DIAGNOSIS — Z9889 Other specified postprocedural states: Secondary | ICD-10-CM

## 2022-12-07 DIAGNOSIS — I480 Paroxysmal atrial fibrillation: Secondary | ICD-10-CM

## 2022-12-07 MED ORDER — NICOTINE 7 MG/24HR TD PT24
7.0000 mg | MEDICATED_PATCH | Freq: Every day | TRANSDERMAL | 3 refills | Status: AC
Start: 2022-12-07 — End: ?

## 2022-12-07 MED ORDER — NICOTINE 14 MG/24HR TD PT24: MEDICATED_PATCH | TRANSDERMAL | 2 refills | Status: DC

## 2022-12-07 NOTE — Progress Notes (Signed)
Primary Physician/Referring:  Ralene Ok, MD  Patient ID: Earl Martin, male    DOB: 11/16/1960, 62 y.o.   MRN: [id number]  Chief Complaint  Patient presents with   Coronary Artery Disease   Atrial Fibrillation    HPI: Earl Martin  is a 62 y.o. male "Earl" Martin with CAD, severe MR noted in June 2018,  hypertension, hyperlipidemia, atrial fibrillation and chronic back pain due to spinal stenosis.  He also has abdominal aortic aneurysm status post repair on 11/29/2014 by placement of stent graft by Dr. Durene Cal.  H/O mitral valve repair and single-vessel coronary bypass surgery,  maze procedure and left atrial appendage clipping performed on 05/18/2018.   He is maintaining sinus rhythm since cardioversion on 01/17/2019.  He is tolerating anticoagulation without any bleeding complications. He is a cigarette smoker unfortunately. 1/2 PPD, is presently wearing a nicotine patch and states that he is on the verge of quitting smoking.  He has not had any chest pain, he has not used S/L NTG since last office visit 6 months ago.  No claudication.    He has developed recurrence of symptoms of claudication left leg worse than the right but bilaterally.  No ulceration or rest pain.  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/2018   SVG to RPL, EVH via right thigh   S/P Maze operation for atrial fibrillation 05/18/2018   Complete bilateral atrial lesion set using bipolar radiofrequency and cryothermy ablation with clipping of LA appendage   S/P mitral valve repair 05/18/2018   34 mm Sorin Memo 4D ring annuloplasty   Sleep apnea    does not use cpap   Spinal stenosis      Past Surgical History:  Procedure Laterality Date   ABDOMINAL AORTIC ENDOVASCULAR STENT GRAFT N/A 11/29/2014   Procedure: ABDOMINAL AORTIC ENDOVASCULAR STENT GRAFT;  Surgeon: Nada Libman, MD;  Location: [facility] OR;  Service: Vascular;  Laterality: N/A;   CARDIAC CATHETERIZATION     CARDIOVERSION N/A 09/20/2012   Procedure: CARDIOVERSION;  Surgeon: [name], MD;  Location: [facility] ENDOSCOPY;  Service: Cardiovascular;  Laterality: N/A;   CARDIOVERSION N/A 01/17/2019   Procedure: CARDIOVERSION;  Surgeon: Yates Decamp, MD;  Location: [facility] ENDOSCOPY;  Service: Cardiovascular;  Laterality: N/A;   CORONARY ARTERY BYPASS GRAFT N/A 05/18/2018   Procedure: CORONARY ARTERY BYPASS GRAFTING (CABG), ON PUMP, TIMES ONE, USING ENDOSCOPICALLY HARVESTED RIGHT GREATER SAPHENOUS VEIN;  Surgeon: Purcell Nails, MD;  Location: [facility] OR;  Service: Open Heart Surgery;  Laterality: N/A;   CORONARY STENT PLACEMENT  12/25/2010   RCA   CORONARY STENT PLACEMENT  12/26/2010   LAD   FEMORAL ARTERY STENT     HAND NERVE GRAFT     INGUINAL HERNIA REPAIR Left 03/07/2013   Procedure: LEFT INGUINAL HERNIA REPAIR;  Surgeon: Emelia Loron, MD;  Location: [facility] OR;  Service: General;  Laterality: Left;   INSERTION OF MESH Left 03/07/2013   Procedure: INSERTION OF MESH;  Surgeon: Emelia Loron, MD;  Location: [facility] OR;  Service: General;  Laterality: Left;   MAZE N/A  05/18/2018   Procedure: MAZE;  Surgeon: Purcell Nails, MD;  Location: [facility] OR;  Service: Open Heart Surgery;  Laterality: N/A;   MITRAL VALVE REPAIR N/A 05/18/2018   Procedure: MITRAL VALVE REPAIR (MVR);  Surgeon: Purcell Nails, MD;  Location: [facility] OR;  Service: Open Heart Surgery;  Laterality: N/A;  Using Memo 4D Semirigid Annuloplasty Ring Size 34   MOUTH SURGERY     RIGHT/LEFT HEART CATH AND CORONARY ANGIOGRAPHY N/A 04/22/2018   Procedure: RIGHT/LEFT HEART CATH AND CORONARY ANGIOGRAPHY;  Surgeon: Yates Decamp, MD;  Location: [facility] INVASIVE CV LAB;  Service:  Cardiovascular;  Laterality: N/A;   ROTATOR CUFF REPAIR     TEE WITHOUT CARDIOVERSION N/A 02/04/2016   Procedure: TRANSESOPHAGEAL ECHOCARDIOGRAM (TEE);  Surgeon: Yates Decamp, MD;  Location: [facility] ENDOSCOPY;  Service: Cardiovascular;  Laterality: N/A;   TEE WITHOUT CARDIOVERSION N/A 04/22/2018   Procedure: TRANSESOPHAGEAL ECHOCARDIOGRAM (TEE);  Surgeon: Yates Decamp, MD;  Location: [facility] ENDOSCOPY;  Service: Cardiovascular;  Laterality: N/A;   TEE WITHOUT CARDIOVERSION N/A 05/18/2018   Procedure: TRANSESOPHAGEAL ECHOCARDIOGRAM (TEE);  Surgeon: Purcell Nails, MD;  Location: [facility] OR;  Service: Open Heart Surgery;  Laterality: N/A;   Social History   Tobacco Use   Smoking status: Every Day    Packs/day: 0.25    Years: 30.00    Additional pack years: 0.00    Total pack years: 7.50    Types: Cigarettes   Smokeless tobacco: Never   Tobacco comments:    3 cigarettes a day  Substance Use Topics   Alcohol use: Yes    Alcohol/week: 14.0 standard drinks of alcohol    Types: 14 Cans of beer per week    Comment:  " 2-3 beers daily "   Marital Status: Divorced  Review of Systems  Cardiovascular:  Positive for claudication and palpitations. Negative for chest pain, dyspnea on exertion and leg swelling.  Gastrointestinal:  Negative for melena.   Objective  Blood pressure 130/80, pulse 90, height 6' (1.829 m), weight 177 lb 12.8 oz (80.6 kg), SpO2 97 %. Body mass index is 24.11 kg/m.     12/07/2022    8:41 AM 12/07/2022    8:33 AM 11/23/2022   11:22 AM  Vitals with BMI  Height  6\' 0"  6\' 0"   Weight  177 lbs 13 oz 179 lbs  BMI  24.11 24.27  Systolic 130 142 638  Diastolic 80 96 91  Pulse 90 88 89    Physical Exam Constitutional:      Appearance: He is well-developed.  Neck:     Thyroid: No thyromegaly.  Cardiovascular:     Rate and Rhythm: Regular rhythm. Bradycardia present.     Pulses: Intact distal pulses.          Carotid pulses are 2+ on the right side and 2+ on the left side.       Femoral pulses are 2+ on the right side and 0 on the left side.      Popliteal pulses are 2+ on the right side and 0 on the left side.       Dorsalis pedis pulses are 0 on the right side and 0 on the left side.       Posterior tibial pulses are 0 on the right side and 0 on the left side.     Heart sounds: Normal heart sounds and S1 normal. No murmur heard.    No gallop. No S3 or S4 sounds.     Comments:  Bilateral varicose veins noted Pulmonary:     Effort: Pulmonary effort is normal.     Breath sounds: Normal breath sounds.  Abdominal:     General: Bowel sounds are normal.     Palpations: Abdomen is soft.  Musculoskeletal:     Right lower leg: No edema.     Left lower leg: No edema.  Skin:    Capillary Refill: Capillary refill takes less than 2 seconds.    Radiology: No results found.  Laboratory examination:  External labs:  Labs 12/07/2022:  Serum glucose 1 1 8  mg, BUN 13, creatinine 0.71, EGFR 103 mL, potassium 4.9, LFTs normal.  Total cholesterol 150, triglycerides 35, HDL 74, LDL 67.  Labs 11/04/2021:  Total cholesterol 157, triglycerides 44, HDL 77, LDL 67.  Serum glucose 90 mg, BUN 11, creatinine 0.76, EGFR 102 mL, potassium 5.1, LFTs normal.  Hb 14.1/HCT 42.5, platelets 295, normal indicis.  Medications after present encounter:  Current Outpatient Medications:    amiodarone (PACERONE) 200 MG tablet, TAKE 1/2 TABLET BY MOUTH EVERY DAY (Patient taking differently: Take 100 mg by mouth daily. Monday through friday), Disp: 45 tablet, Rfl: 7   amoxicillin (AMOXIL) 500 MG capsule, Take 4 capsules (2,000 mg total) by mouth once as needed for up to 1 dose. One hour before dental procedure, Disp: 16 capsule, Rfl: 0   Ascorbic Acid (VITAMIN C) 1000 MG tablet, Take 1,000 mg by mouth daily., Disp: , Rfl:    atorvastatin (LIPITOR) 40 MG tablet, Take 40 mg by mouth at bedtime., Disp: , Rfl:    buPROPion (WELLBUTRIN SR) 150 MG 12 hr tablet, TAKE 1 TABLET BY MOUTH TWICE A DAY AT  10AM AND AGAIN AT 4PM., Disp: 180 tablet, Rfl: 2   Cyanocobalamin (VITAMIN B 12 PO), Take 1 tablet by mouth daily., Disp: , Rfl:    ezetimibe (ZETIA) 10 MG tablet, Take 10 mg by mouth daily., Disp: , Rfl:    metoprolol succinate (TOPROL-XL) 50 MG 24 hr tablet, Take with or immediately following a meal., Disp: 90 tablet, Rfl: 0   Multiple Vitamin (MULTIVITAMIN) capsule, Take 1 capsule by mouth daily., Disp: , Rfl:    nitroGLYCERIN (NITROSTAT) 0.4 MG SL tablet, Place 1 tablet (0.4 mg total) under the tongue every 5 (five) minutes as needed for chest pain., Disp: 25 tablet, Rfl: 2   XARELTO 20 MG TABS tablet, TAKE 1 TABLET BY MOUTH ONCE DAILY IN THE EVENING AFTER DINNER., Disp: 90 tablet, Rfl: 3   zolpidem (AMBIEN) 10 MG tablet, Take 10 mg by mouth at bedtime as needed., Disp: , Rfl:    nicotine ([employer]S NICOTINE TRANSDERMAL SYS) 14 mg/24hr patch, APPLY 1 PATCH ONTO THE SKIN EVERY DAY, Disp: 28 patch, Rfl: 2   nicotine (NICODERM CQ) 7 mg/24hr patch, Place 1 patch (7 mg total) onto the skin daily. Once done with 14 mg Rx, Disp: 28 patch, Rfl: 3   Cardiac Studies:   Coronary angiogram 04/22/2018: Patent stents placed for acute inferior MI on 12/23/2010 S/P mid RCA non drug eluting stent implantation, staged proximal LAD DES stent 12/26/2010. PL branch of RCA chronically occluded with collaterals from LAD. Mid LAD distal to stent 50% stenosis. Mild pulmonary hypertension.  CABG and MV Repair and Maze and LAA clipping 05/18/2018: SVG to RCA, Sorin Memo 4D annuloplasty 34 mm ring by Ashley Mariner, MD  Echocardiogram 06/30/2018: Left ventricle cavity is normal in size. Moderate concentric hypertrophy of the left ventricle. Mild decrease in global wall motion. Visual EF is  45-50%. Calculated EF 50%. Left atrial cavity is severely dilated. S/p mitral valve repair with mild eccentric residual mitral regurgitation seen. No significant mitral stenosis. Mild tricuspid regurgitation.  No evidence of pulmonary  hypertension. Compared to previous study on 03/29/2018, mitral valve repair is new.  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 Patent endovascular aneurysm repair with no evidence of endoleak. 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  Direct current cardioversion: 01/17/2019 Procedure: Using 120 mg of IV Propofol and 80 mg IV Lidocaine (for reducing venous pain) for achieving deep sedation, synchronized direct current cardioversion performed. Patient was delivered with 120 Joules of electricity X 1 with success to NSR. Patient tolerated the procedure well. No immediate complication noted.  Carotid artery duplex 10/11/2020: Minimal stenosis in the right internal carotid artery. Stenosis in the right external carotid artery (<50%). Stenosis in the left internal carotid artery (16-49%). Stenosis in the left external carotid artery (<50%). Antegrade right vertebral artery flow. Compared to 03/12/2020, right ICA stenosis of 50-69% not present. There is also significant morphology improvement in right ICA by duplex. Follow up in one year is appropriate if clinically indicated.  Peripheral angiogram 04/11/2021: Peripheral arterial disease with successful placement of gold VBX stent x2 into the right common and external iliac artery, extending the endoprosthesis from previous graft. Right femoral endarterectomy and bovine pericardial patch angioplasty on 04/11/2021 by Dr. Howie Ill in [place] for new onset claudication.   Right lower extremity arterial duplex & ABI 11/23/2022: Occlusion vs near occlusion at the popliteal and TPT. Tibial  vessels reconstitute.   Compared to 08/16/2019, right ABI 1.0 and left ABI 0.92.  Abdominal Aortic Duplex 11/23/2022: Abdominal Aorta: Patent endovascular aneurysm repair with no evidence of  endoleak. The largest aortic diameter remains essentially unchanged   compared to prior exam. Previous diameter measurement was 4.6 cm obtained  on 12/05/21 at [facility].   EKG:  EKG 12/07/2022: Sinus rhythm with first-degree AV block at rate of 87 bpm, leftward enlargement, right axis deviation, left posterior fascicular block.  Incomplete right bundle branch block.  No evidence of ischemia.  Normal QT interval.  Compared to 12/05/2021, rightward axis slightly more prominent otherwise no significant change.  Heart rate has increased from 45 bpm to 87 bpm.  EKG 12/05/2018: Atrial fibrillation with rapid ventricular response at the rate of 111 bpm, normal axis, IVCD, LVH.  Normal QT interval.  No evidence of ischemia. No significant change from  EKG 10/04/2017: Atrial fibrillation with controlled response at the rate of 66 bpm.  Assessment     ICD-10-CM   1. Coronary artery disease involving native coronary artery of native heart without angina pectoris  I25.10 EKG 12-Lead    2. S/P mitral valve repair  Z98.890     3. Paroxysmal atrial fibrillation (HCC)  I48.0 EKG 12-Lead    4. Atherosclerosis of native artery of both lower extremities with intermittent claudication (HCC)  I70.213 PCV LOWER ARTERIAL (BILATERAL)    nicotine (CVS NICOTINE TRANSDERMAL SYS) 14 mg/24hr patch    nicotine (NICODERM CQ) 7 mg/24hr patch    5. S/P AAA repair using straight graft  Z98.890    Z86.79     6. Tobacco use  Z72.0 nicotine (CVS NICOTINE TRANSDERMAL SYS) 14 mg/24hr patch    nicotine (NICODERM CQ) 7 mg/24hr patch      CHA2DS2-VASc Score is 2.  Yearly risk of stroke: 2.3% (HTN, Vasc Dz).  Score  of 1=0.6; 2=2.2; 3=3.2; 4=4.8; 5=7.2; 6=9.8; 7=>9.8) -(CHF; HTN; vasc disease DM,  Male = 1; Age <65 =0; 65-74 = 1,  >75 =2; stroke/embolism= 2).   Meds ordered this encounter  Medications   nicotine (CVS NICOTINE TRANSDERMAL SYS) 14 mg/24hr patch    Sig: APPLY 1 PATCH ONTO THE SKIN EVERY DAY    Dispense:  28 patch    Refill:  2   nicotine (NICODERM CQ) 7 mg/24hr  patch    Sig: Place 1 patch (7 mg total) onto the skin daily. Once done with 14 mg Rx    Dispense:  28 patch    Refill:  3   Medications Discontinued During This Encounter  Medication Reason   [employer]S NICOTINE TRANSDERMAL SYS 14 MG/24HR patch Reorder   nicotine (NICODERM CQ) 7 mg/24hr patch Reorder    Recommendations:   Erica "Earl" Uddin is a 62 y.o. Caucasian male with CAD, severe MR noted in June 2018,  hypertension, hyperlipidemia, atrial fibrillation and chronic back pain due to spinal stenosis.  He also has abdominal aortic aneurysm status post repair on 11/29/2014 by placement of stent graft by Dr. Durene Cal.  H/O mitral valve repair and single-vessel coronary bypass surgery,  maze procedure and left atrial appendage clipping performed on 05/18/2018.   1. Coronary artery disease involving native coronary artery of native heart without angina pectoris Patient is currently doing well and from coronary artery disease standpoint fortunately has not had any angina pectoris, EKG reveals normal sinus rhythm. - EKG 12-Lead  2. S/P mitral valve repair No change in physical exam.  3. Paroxysmal atrial fibrillation (HCC) He is maintaining sinus rhythm on low-dose of amiodarone 100 mg daily.  Continue the same.  She is also tolerating anticoagulation with Xarelto. - EKG 12-Lead  4. Atherosclerosis of native artery of both lower extremities with intermittent claudication (HCC) Peripheral arterial disease has progressed, he has absent left popliteal and left femoral pulse along with absent pedal pulses as well, I will repeat lower extremity arterial duplex, his symptoms of claudication are class III, however he is still able to do rehab and walks on a daily basis with mild to moderate amount of difficulty.  If indeed he shows significant progression of disease, better to proceed early than late, external ABI reviewed, will continue conservative management for now.  No limb threatening  ischemia. Nicotine patch again Rx today. He is scheduled for compete physical by his PCP next month and will follow  labs - PCV LOWER ARTERIAL (BILATERAL); Future  5. S/P AAA repair using straight graft Being followed by vascular surgery, AAA duplex reviewed, no endoleak.  Presently stable.  Office visit in 6 months or sooner if problems, if lower extremity arterial duplex is markedly abnormal, we could certainly consider peripheral arteriogram and possibly consider endovascular repair.     Yates Decamp, MD, [facility] 12/07/2022, 9:11 AM Office: [phone]

## 2022-12-08 NOTE — Progress Notes (Signed)
Labs 12/07/2022:  Serum glucose 1 1 8  mg, BUN 13, creatinine 0.71, EGFR 103 mL, potassium 4.9, LFTs normal.  Total cholesterol 150, triglycerides 35, HDL 74, LDL 67.

## 2023-01-07 ENCOUNTER — Other Ambulatory Visit: Payer: Self-pay

## 2023-01-07 ENCOUNTER — Other Ambulatory Visit: Payer: Self-pay | Admitting: Cardiology

## 2023-01-07 DIAGNOSIS — I4821 Permanent atrial fibrillation: Secondary | ICD-10-CM

## 2023-01-07 DIAGNOSIS — F411 Generalized anxiety disorder: Secondary | ICD-10-CM

## 2023-01-07 DIAGNOSIS — Z720 Tobacco use: Secondary | ICD-10-CM

## 2023-01-07 MED ORDER — METOPROLOL SUCCINATE ER 50 MG PO TB24
ORAL_TABLET | ORAL | 0 refills | Status: DC
Start: 2023-01-07 — End: 2023-04-06

## 2023-01-07 MED ORDER — RIVAROXABAN 20 MG PO TABS: 20.0000 mg | ORAL_TABLET | Freq: Every day | ORAL | 3 refills | Status: DC

## 2023-01-15 ENCOUNTER — Ambulatory Visit: Payer: BLUE CROSS/BLUE SHIELD

## 2023-01-15 DIAGNOSIS — I70213 Atherosclerosis of native arteries of extremities with intermittent claudication, bilateral legs: Secondary | ICD-10-CM

## 2023-01-20 ENCOUNTER — Encounter: Payer: Self-pay | Admitting: Cardiology

## 2023-01-20 NOTE — Progress Notes (Signed)
Labs 12/07/2022:  Total cholesterol [birth year]1931, triglycerides 36, HDL 68, LDL 56.  A1c 5.6%.  Hb 14.3/HCT 43.3, platelets 170, normal indicis.  Serum glucose 1 113 mg, BUN 9, creatinine 0.79, EGFR 100 mL, potassium 4.4, LFTs normal.

## 2023-01-24 NOTE — Progress Notes (Signed)
Lower Extremity Arterial Duplex 01/15/2023: The right lower extremity waveforms demonstrate a monophasic flow pattern suggesting proximal physiologically significant stenosis (Iliac vessel).  Moderate velocity increase at the left mid superficial femoral artery suggests >50% stenosis with dampened monophasic waveform pattern below the left distal SFA. This exam reveals moderately decreased perfusion of the right lower extremity, noted at the dorsalis pedis and post tibial artery level (AB 0.77) with severely abnormal monophasic waveform at the ankle.  This exam reveals mildly decreased perfusion of the left lower extremity, noted at the dorsalis pedis and post tibial artery level (ABI 0.80) with severely abnormal monophasic waveform at the ankle.   Study suggests restenosis in the right iliac stent and new left SFA stenosis. Consider catheter directed approach for further evaluation.

## 2023-02-26 ENCOUNTER — Other Ambulatory Visit: Payer: Self-pay | Admitting: Cardiology

## 2023-02-26 DIAGNOSIS — I70213 Atherosclerosis of native arteries of extremities with intermittent claudication, bilateral legs: Secondary | ICD-10-CM

## 2023-02-26 DIAGNOSIS — Z720 Tobacco use: Secondary | ICD-10-CM

## 2023-02-26 NOTE — Telephone Encounter (Signed)
Refill request

## 2023-02-26 NOTE — Telephone Encounter (Signed)
Ask him if we can do 7 mg patch and 1 refill and he has to stop using the patch. Cannot use patch indefinately

## 2023-02-27 ENCOUNTER — Encounter: Payer: Self-pay | Admitting: Cardiology

## 2023-03-01 NOTE — Telephone Encounter (Signed)
Please follow up

## 2023-03-02 NOTE — Telephone Encounter (Signed)
Patient said 7 mg is fine he is currently finishing up the 14 that on his next refill he can start the 7

## 2023-03-02 NOTE — Telephone Encounter (Signed)
ICD-10-CM   1. Atherosclerosis of native artery of both lower extremities with intermittent claudication (HCC)  I70.213 DISCONTINUED: nicotine (NICODERM CQ - DOSED IN MG/24 HR) 7 mg/24hr patch    2. Tobacco use  Z72.0 DISCONTINUED: nicotine (NICODERM CQ - DOSED IN MG/24 HR) 7 mg/24hr patch     Meds ordered this encounter  Medications   DISCONTD: nicotine (NICODERM CQ - DOSED IN MG/24 HR) 7 mg/24hr patch    Sig: APPLY 1 PATCH ONTO THE SKIN EVERY DAY    Dispense:  28 patch    Refill:  2    Medications Discontinued During This Encounter  Medication Reason   nicotine ([facility] NICOT[employer]) 14 mg/24hr patch    nicotine (NICODERM CQ - DOSED IN MG/24 HR) 7 mg/24hr patch Reorder

## 2023-03-22 ENCOUNTER — Telehealth: Payer: Self-pay | Admitting: Cardiology

## 2023-03-22 DIAGNOSIS — Z9889 Other specified postprocedural states: Secondary | ICD-10-CM

## 2023-03-22 MED ORDER — AMOXICILLIN 500 MG PO CAPS
2000.0000 mg | ORAL_CAPSULE | Freq: Once | ORAL | 0 refills | Status: DC | PRN
Start: 2023-03-22 — End: 2023-08-09

## 2023-03-22 NOTE — Telephone Encounter (Signed)
*  STAT* If patient is at the pharmacy, call can be transferred to refill team.   1. Which medications need to be refilled? (please list name of each medication and dose if known) amoxicillin (AMOXIL) 500 MG capsule   zolpidem (AMBIEN) 10 MG tablet   2. Which pharmacy/location (including street and city if local pharmacy) is medication to be sent to?  [id number] - [place], [place] - [address] AT [address] [facility]    3. Do they need a 30 day or 90 day supply? 90   Patient states he is leaving to go out of town town. Needs to have it before leaving

## 2023-03-22 NOTE — Telephone Encounter (Signed)
Pt's medication amoxicillin was sent to pt's pharmacy. I advised pt that he would need to contact PCP for a refill on his medication zolpidem. Pt verbalized understanding.

## 2023-04-04 ENCOUNTER — Other Ambulatory Visit: Payer: Self-pay | Admitting: Cardiology

## 2023-04-04 DIAGNOSIS — I4821 Permanent atrial fibrillation: Secondary | ICD-10-CM

## 2023-06-07 ENCOUNTER — Ambulatory Visit: Payer: Self-pay | Admitting: Cardiology

## 2023-07-19 ENCOUNTER — Telehealth: Payer: Self-pay | Admitting: Cardiology

## 2023-07-19 LAB — LAB REPORT - SCANNED
A1c: 5.7
EGFR: 104

## 2023-07-19 NOTE — Telephone Encounter (Signed)
Patient is calling to know if Dr. Jacinto Halim wanted him to complete any test before his appointment on 08/09/23. Please advise.

## 2023-07-19 NOTE — Telephone Encounter (Signed)
If he has not had blood work from PCP then CBC, CMP, Lipids (CAD, Paroxysmal atrial fibrillation

## 2023-07-20 ENCOUNTER — Encounter: Payer: Self-pay | Admitting: Cardiology

## 2023-07-20 NOTE — Progress Notes (Signed)
PCP labs 07/19/2023:  A1c 5.7%.  Hb 14.8/HCT 44.8, platelets 193, normal indicis.  Total cholesterol 148, triglycerides 48, HDL 65, LDL 69.  BUN 13, creatinine 0.70, EGFR 104 mL, potassium 4.5.

## 2023-07-20 NOTE — Telephone Encounter (Signed)
I spoke with patient and gave him message from Dr Jacinto Halim regarding needed lab work.  Patient saw PCP yesterday and had lab work done.  He will bring results to upcoming appointment

## 2023-07-20 NOTE — Telephone Encounter (Signed)
 Patient returned RN's call

## 2023-07-20 NOTE — Telephone Encounter (Signed)
 Left message to call office

## 2023-08-08 NOTE — Progress Notes (Unsigned)
 Cardiology Office Note:  .   Date:  08/09/2023  ID:  Earl Martin, DOB August 06, 1960, MRN [id number] PCP: Ralene Ok, MD   [facility] Providers Cardiologist:  Yates Decamp, MD   History of Present Illness: .   Earl Martin is a 62 y.o. "[name]" Tardif with CAD, severe MR noted in June 2018,  hypertension, hyperlipidemia, atrial fibrillation and chronic back pain due to spinal stenosis.  He also has abdominal aortic aneurysm status post repair on 11/29/2014 by placement of stent graft by Dr. Durene Cal.  H/O mitral valve repair and single-vessel coronary bypass surgery,  maze procedure and left atrial appendage clipping performed on 05/18/2018, PAD with history of VBX stent placement to right iliac artery and also right femoral endarterectomy on 04/11/2021 in [place].    He is maintaining sinus rhythm since cardioversion on 01/17/2019.  He is tolerating anticoagulation without any bleeding complications. He is a cigarette smoker unfortunately.  He has reduced smoking from 1 pack of cigarettes a day to 3 cigarettes a day and is on the verge of quitting smoking.  He continues to have significant symptoms of claudication involving left leg worse on the right and has now been scheduled for peripheral arteriogram in [place].  Discussed the use of AI scribe software for clinical note transcription with the patient, who gave verbal consent to proceed.  History of Present Illness   The patient, with a history of coronary artery disease and peripheral arterial disease, presents with leg pain. He reports that the pain is most severe in the left leg, but also affects the right leg. The pain begins at the top of the shins and wraps around to the calves. Despite the pain, he continues to go to the gym three times a week for ninety minutes.  The patient also reports that he is almost done with smoking, currently down to three cigarettes a day. He has a history of obesity, but has since lost weight  and his blood sugar levels have normalized.    Labs   External Labs:  Labs from PCP: Labs faxed 07/19/2023:  A1c 5.7%.  Hb 14.8/HCT 44.8, platelets 11.7, normal indicis.  Total cholesterol 148, triglycerides 48, HDL 65, LDL 69.  Serum glucose 103 mg, BUN 13, creatinine 0.70, EGFR 140 mL, potassium 4.5, LFTs normal.  Review of Systems  Cardiovascular:  Positive for claudication. Negative for chest pain, dyspnea on exertion and leg swelling.   Physical Exam:   VS:  BP 118/78 (BP Location: Left Arm, Patient Position: Sitting, Cuff Size: Normal)   Pulse (!) 51   Resp 16   Ht 6' (1.829 m)   Wt 175 lb (79.4 kg)   SpO2 93%   BMI 23.73 kg/m    Wt Readings from Last 3 Encounters:  08/09/23 175 lb (79.4 kg)  12/07/22 177 lb 12.8 oz (80.6 kg)  11/23/22 179 lb (81.2 kg)    Physical Exam Neck:     Vascular: No carotid bruit or JVD.  Cardiovascular:     Rate and Rhythm: Normal rate and regular rhythm.     Pulses: Intact distal pulses.          Femoral pulses are 3+ on the right side and 0 on the left side.      Popliteal pulses are 2+ on the right side and 0 on the left side.       Dorsalis pedis pulses are 0 on the right side and 0 on the left side.  Posterior tibial pulses are 0 on the right side and 0 on the left side.     Heart sounds: Normal heart sounds. No murmur heard.    No gallop.  Pulmonary:     Effort: Pulmonary effort is normal.     Breath sounds: Normal breath sounds.  Abdominal:     General: Bowel sounds are normal.     Palpations: Abdomen is soft.  Musculoskeletal:     Right lower leg: No edema.     Left lower leg: No edema.    Studies Reviewed: Marland Kitchen     Coronary angiogram 04/22/2018:    CABG and MV Repair and Maze and LAA clipping 05/18/2018: SVG to RCA, Sorin Memo 4D annuloplasty 34 mm ring by Ashley Mariner, MD   Carotid artery duplex 10/11/2020: Minimal stenosis in the right internal carotid artery. Stenosis in the right external carotid artery  (<50%). Stenosis in the left internal carotid artery (16-49%). Stenosis in the left external carotid artery (<50%). Antegrade right vertebral artery flow.  Compared to 03/12/2020, right ICA stenosis of 50-69% not present. There is also significant morphology improvement in right ICA by duplex. Follow up in one year is appropriate if clinically indicated.  Right lower extremity arterial duplex & ABI 11/23/2022: Occlusion vs near occlusion at the popliteal and TPT. Tibial  vessels reconstitute.   Compared to 08/16/2019, right ABI 1.0 and left ABI 0.92.  Lower Extremity Arterial Duplex 01/15/2023: The right lower extremity waveforms demonstrate a monophasic flow pattern suggesting proximal physiologically significant stenosis (Iliac vessel). Moderate velocity increase at the left mid superficial femoral artery suggests >50% stenosis with dampened monophasic waveform pattern below the left distal SFA. This exam reveals moderately decreased perfusion of the right lower extremity, noted at the dorsalis pedis and post tibial artery level (AB 0.77) with severely abnormal monophasic waveform at the ankle. This exam reveals mildly decreased perfusion of the left lower extremity, noted at the dorsalis pedis and post tibial artery level (ABI 0.80) with severely abnormal monophasic waveform at the ankle.   Study suggests restenosis in the right iliac stent and new left SFA stenosis. Consider catheter directed approach for further evaluation.  EKG:    EKG Interpretation Date/Time:  Monday August 09 2023 09:04:32 EST Ventricular Rate:  49 PR Interval:  260 QRS Duration:  102 QT Interval:  444 QTC Calculation: 401 R Axis:   88  Text Interpretation: EKG 08/09/2023: Normal sinus rhythm scratch that sinus rhythm with first-degree AV block at the rate of 49 bpm, rightward axis, poor R progression, probably normal variant.  No significant change from 01/17/2019. Confirmed by Delrae Rend [phone]) on 08/09/2023  9:19:13 AM    EKG 12/07/2022: Sinus rhythm with first-degree AV block at rate of 87 bpm, leftward enlargement, right axis deviation, left posterior fascicular block. Incomplete right bundle branch block. No evidence of ischemia. Normal QT interval.   Medications and allergies    No Known Allergies   Current Outpatient Medications:    amiodarone (PACERONE) 200 MG tablet, TAKE 1/2 TABLET BY MOUTH EVERY DAY (Patient taking differently: Take 100 mg by mouth daily. Monday through friday), Disp: 45 tablet, Rfl: 7   Ascorbic Acid (VITAMIN C) 1000 MG tablet, Take 1,000 mg by mouth daily., Disp: , Rfl:    aspirin EC 81 MG tablet, Take 81 mg by mouth daily. Swallow whole., Disp: , Rfl:    atorvastatin (LIPITOR) 40 MG tablet, Take 40 mg by mouth at bedtime., Disp: , Rfl:    buPROPion (  WELLBUTRIN SR) 150 MG 12 hr tablet, TAKE 1 TABLET BY MOUTH TWICE A DAY AT 10AM AND AGAIN AT 4PM., Disp: 180 tablet, Rfl: 2   Cyanocobalamin (VITAMIN B 12 PO), Take 1 tablet by mouth daily., Disp: , Rfl:    ezetimibe (ZETIA) 10 MG tablet, Take 10 mg by mouth daily., Disp: , Rfl:    metoprolol succinate (TOPROL-XL) 50 MG 24 hr tablet, TAKE WITH OR IMMEDIATELY FOLLOWING A MEAL., Disp: 90 tablet, Rfl: 3   Multiple Vitamin (MULTIVITAMIN) capsule, Take 1 capsule by mouth daily., Disp: , Rfl:    nicotine (NICODERM CQ) 7 mg/24hr patch, Place 1 patch (7 mg total) onto the skin daily. Once done with 14 mg Rx, Disp: 28 patch, Rfl: 3   nitroGLYCERIN (NITROSTAT) 0.4 MG SL tablet, Place 1 tablet (0.4 mg total) under the tongue every 5 (five) minutes as needed for chest pain., Disp: 25 tablet, Rfl: 2   rivaroxaban (XARELTO) 20 MG TABS tablet, Take 1 tablet (20 mg total) by mouth daily with supper. TAKE 1 TABLET BY MOUTH ONCE DAILY IN THE EVENING AFTER DINNER., Disp: 90 tablet, Rfl: 3   zolpidem (AMBIEN) 10 MG tablet, Take 10 mg by mouth at bedtime as needed., Disp: , Rfl:    ASSESSMENT AND PLAN: .      ICD-10-CM   1. Coronary artery  disease involving native coronary artery of native heart without angina pectoris  I25.10 EKG 12-Lead    2. S/P mitral valve repair  Z98.890     3. Paroxysmal atrial fibrillation (HCC)  I48.0     4. Claudication in peripheral vascular disease (HCC)  I73.9      Assessment and Plan    Peripheral Arterial Disease (PAD) Reports ongoing leg pain, especially in the left leg, from the shins to the calves. The left leg has significant circulation issues, while the right leg has less severe blockages below the knee. A peripheral arteriogram and possible stent placement are scheduled for next week to improve circulation and alleviate symptoms. Smoking cessation is crucial to improve outcomes and prevent further complications. Proceed with the scheduled procedure and encourage smoking cessation.  He has AAA and has had endovascular repair in the past, has remained stable.  Paroxysmal Atrial Fibrillation Currently in rhythm and taking rivaroxaban 20 mg daily in the evening. Stop the medication two days before the upcoming procedure and resume it a couple of days after. Anticoagulation management around the time of surgery is important to prevent thromboembolic events. Continue rivaroxaban 20 mg daily, with adjustments around the procedure.  He is on amiodarone 100 mg 5 days a week and in spite of bradycardia he is asymptomatic and is maintaining sinus rhythm.  Coronary Artery Disease (CAD) Well-managed with no new symptoms. EKG is normal. On atorvastatin and ezetimibe for cholesterol management, and metoprolol for heart rate control. Mitral valve function is normal, no change in physical exam with regard to cardiac examination. Current medications are effectively managing the condition. Continue atorvastatin, azithromycin, and metoprolol 50 mg daily.  General Health Maintenance Diabetes is well-controlled with an A1c of 5.7%. Cholesterol levels are well-managed with an LDL of 69. Blood glucose is slightly  elevated at 103 but not concerning. Maintaining a healthy lifestyle and diet is important to continue managing these conditions effectively. Continue current medications, monitor blood glucose and cholesterol levels, and maintain a healthy lifestyle and diet.  Complete smoking cessation again discussed, he has reduced smoking from 1/2 pack of cigarettes a day to 3 cigarettes  a day.  External labs reviewed, normal renal function, normal CBC, lipids under excellent control with goal LDL <70.  Follow-up Schedule a follow-up appointment in one year.   Signed,  Yates Decamp, MD, [facility] 08/09/2023, 9:45 AM [facility] [address] [place], [place] [address] Phone: [phone]. Fax:  [phone]

## 2023-08-09 ENCOUNTER — Encounter: Payer: Self-pay | Admitting: Cardiology

## 2023-08-09 ENCOUNTER — Ambulatory Visit: Payer: BLUE CROSS/BLUE SHIELD | Attending: Cardiology | Admitting: Cardiology

## 2023-08-09 VITALS — BP 118/78 | HR 51 | Resp 16 | Ht 72.0 in | Wt 175.0 lb

## 2023-08-09 DIAGNOSIS — Z9889 Other specified postprocedural states: Secondary | ICD-10-CM

## 2023-08-09 DIAGNOSIS — I2510 Atherosclerotic heart disease of native coronary artery without angina pectoris: Secondary | ICD-10-CM

## 2023-08-09 DIAGNOSIS — I480 Paroxysmal atrial fibrillation: Secondary | ICD-10-CM | POA: Diagnosis not present

## 2023-08-09 DIAGNOSIS — I739 Peripheral vascular disease, unspecified: Secondary | ICD-10-CM | POA: Diagnosis not present

## 2023-08-09 DIAGNOSIS — Z8679 Personal history of other diseases of the circulatory system: Secondary | ICD-10-CM

## 2023-08-09 NOTE — Patient Instructions (Signed)

## 2023-09-29 ENCOUNTER — Other Ambulatory Visit: Payer: Self-pay | Admitting: Cardiology

## 2023-09-29 DIAGNOSIS — Z720 Tobacco use: Secondary | ICD-10-CM

## 2023-09-29 DIAGNOSIS — F411 Generalized anxiety disorder: Secondary | ICD-10-CM

## 2023-09-29 NOTE — Telephone Encounter (Signed)
 Pt's pharmacy is requesting a refill on medication bupropion. Would Dr. Berry Bristol like to refill this non cardiac medication? Please address

## 2023-10-03 ENCOUNTER — Other Ambulatory Visit: Payer: Self-pay | Admitting: Cardiology

## 2023-10-03 DIAGNOSIS — I480 Paroxysmal atrial fibrillation: Secondary | ICD-10-CM

## 2023-11-15 ENCOUNTER — Telehealth: Payer: Self-pay | Admitting: Cardiology

## 2023-11-15 DIAGNOSIS — Z2989 Encounter for other specified prophylactic measures: Secondary | ICD-10-CM

## 2023-11-15 DIAGNOSIS — I2510 Atherosclerotic heart disease of native coronary artery without angina pectoris: Secondary | ICD-10-CM

## 2023-11-15 DIAGNOSIS — Z9889 Other specified postprocedural states: Secondary | ICD-10-CM

## 2023-11-15 MED ORDER — NITROGLYCERIN 0.4 MG SL SUBL
0.4000 mg | SUBLINGUAL_TABLET | SUBLINGUAL | 2 refills | Status: AC | PRN
Start: 2023-11-15 — End: ?

## 2023-11-15 MED ORDER — AMOXICILLIN 500 MG PO CAPS: 2000.0000 mg | ORAL_CAPSULE | Freq: Once | ORAL | 6 refills | Status: AC

## 2023-11-15 NOTE — Telephone Encounter (Signed)
 ICD-10-CM   1. S/P mitral valve repair  Z98.890 amoxicillin  (AMOXIL ) 500 MG capsule    2. Coronary artery disease involving native coronary artery of native heart without angina pectoris  I25.10 nitroGLYCERIN  (NITROSTAT ) 0.4 MG SL tablet   2012: Mid RCA and LAD stenting. CABG 05/18/18: SVG to RCA, MV repair, Maze and LAA clipping    3. Indication present for endocarditis prophylaxis  Z29.89 amoxicillin  (AMOXIL ) 500 MG capsule     Meds ordered this encounter  Medications   nitroGLYCERIN  (NITROSTAT ) 0.4 MG SL tablet    Sig: Place 1 tablet (0.4 mg total) under the tongue every 5 (five) minutes as needed for chest pain.    Dispense:  25 tablet    Refill:  2   amoxicillin  (AMOXIL ) 500 MG capsule    Sig: Take 4 capsules (2,000 mg total) by mouth once for 1 dose.    Dispense:  4 capsule    Refill:  6

## 2023-11-15 NOTE — Telephone Encounter (Signed)
*  STAT* If patient is at the pharmacy, call can be transferred to refill team.   1. Which medications need to be refilled? (please list name of each medication and dose if known)   nitroGLYCERIN  (NITROSTAT ) 0.4 MG SL tablet     2. Would you like to learn more about the convenience, safety, & potential cost savings by using the [facility]? No   3. Are you open to using the [facility] ([facility] No   4. Which pharmacy/location (including street and city if local pharmacy) is medication to be sent to? [id number] - [place], [place] - [address] AT [address] [facility]    5. Do they need a 30 day or 90 day supply? 30 day  Pt also requesting script for Amoxicillin  due to upcoming dental appt.

## 2023-11-15 NOTE — Telephone Encounter (Signed)
 Called patient back about message. Patient needs new nitroglycerin  and needs Amoxicillin  for 3 dental procedures he has coming up. Will send message to Dr. Ganji for the okay to send in the Amoxicillin .

## 2024-01-14 ENCOUNTER — Telehealth: Payer: Self-pay | Admitting: Cardiology

## 2024-01-14 ENCOUNTER — Other Ambulatory Visit: Payer: Self-pay

## 2024-01-14 DIAGNOSIS — I4821 Permanent atrial fibrillation: Secondary | ICD-10-CM

## 2024-01-14 MED ORDER — RIVAROXABAN 20 MG PO TABS: 20.0000 mg | ORAL_TABLET | Freq: Every day | ORAL | 3 refills | Status: AC

## 2024-01-14 NOTE — Telephone Encounter (Signed)
*  STAT* If patient is at the pharmacy, call can be transferred to refill team.   1. Which medications need to be refilled? (please list name of each medication and dose if known)   rivaroxaban  (XARELTO ) 20 MG TABS tablet   2. Would you like to learn more about the convenience, safety, & potential cost savings by using the [facility]?   3. Are you open to using the [facility] ([facility]. ).  4. Which pharmacy/location (including street and city if local pharmacy) is medication to be sent to?  [id number] - [place], [place] - [address] [facility] [address] [facility]   5. Do they need a 30 day or 90 day supply?   90 day  Patient stated he is completely out of this medication.

## 2024-01-14 NOTE — Telephone Encounter (Signed)
 Prescription refill request for Xarelto  received.  Indication:AFIB Last office visit:2/25 Weight:79.4  kg Age:63 Scr:0.66  3/25 CrCl:129.66  ml/min  Prescription refilled

## 2024-03-28 ENCOUNTER — Other Ambulatory Visit: Payer: Self-pay | Admitting: Cardiology

## 2024-03-28 DIAGNOSIS — I4821 Permanent atrial fibrillation: Secondary | ICD-10-CM

## 2024-09-25 ENCOUNTER — Ambulatory Visit: Admitting: Cardiology
